# Patient Record
Sex: Male | Born: 1955 | Race: White | Hispanic: No | Smoking: Former smoker
Health system: Southern US, Community
[De-identification: ages and names within clinical notes are randomized; demographics above are authoritative.]

## PROBLEM LIST (undated history)

## (undated) DIAGNOSIS — N183 Chronic kidney disease, stage 3 unspecified: Secondary | ICD-10-CM

## (undated) DIAGNOSIS — J449 Chronic obstructive pulmonary disease, unspecified: Secondary | ICD-10-CM

## (undated) DIAGNOSIS — I700 Atherosclerosis of aorta: Secondary | ICD-10-CM

## (undated) DIAGNOSIS — K8020 Calculus of gallbladder without cholecystitis without obstruction: Secondary | ICD-10-CM

## (undated) DIAGNOSIS — N189 Chronic kidney disease, unspecified: Secondary | ICD-10-CM

## (undated) DIAGNOSIS — I10 Essential (primary) hypertension: Secondary | ICD-10-CM

## (undated) DIAGNOSIS — I2510 Atherosclerotic heart disease of native coronary artery without angina pectoris: Secondary | ICD-10-CM

## (undated) DIAGNOSIS — E785 Hyperlipidemia, unspecified: Secondary | ICD-10-CM

## (undated) DIAGNOSIS — M5416 Radiculopathy, lumbar region: Secondary | ICD-10-CM

## (undated) DIAGNOSIS — N1831 Chronic kidney disease, stage 3a: Secondary | ICD-10-CM

## (undated) DIAGNOSIS — G8929 Other chronic pain: Secondary | ICD-10-CM

## (undated) DIAGNOSIS — M1611 Unilateral primary osteoarthritis, right hip: Secondary | ICD-10-CM

## (undated) DIAGNOSIS — Z7901 Long term (current) use of anticoagulants: Secondary | ICD-10-CM

## (undated) DIAGNOSIS — R0609 Other forms of dyspnea: Secondary | ICD-10-CM

## (undated) DIAGNOSIS — Z803 Family history of malignant neoplasm of breast: Secondary | ICD-10-CM

## (undated) DIAGNOSIS — N200 Calculus of kidney: Secondary | ICD-10-CM

## (undated) HISTORY — DX: Family history of malignant neoplasm of breast: Z80.3

---

## 2001-11-20 HISTORY — PX: OTHER SURGICAL HISTORY: SHX169

## 2008-08-25 ENCOUNTER — Emergency Department: Payer: Self-pay | Admitting: Emergency Medicine

## 2009-01-09 ENCOUNTER — Emergency Department (HOSPITAL_COMMUNITY): Admission: EM | Admit: 2009-01-09 | Discharge: 2009-01-09 | Payer: Self-pay | Admitting: Emergency Medicine

## 2009-04-07 ENCOUNTER — Emergency Department (HOSPITAL_COMMUNITY): Admission: EM | Admit: 2009-04-07 | Discharge: 2009-04-08 | Payer: Self-pay | Admitting: Emergency Medicine

## 2009-12-21 ENCOUNTER — Emergency Department (HOSPITAL_COMMUNITY): Admission: EM | Admit: 2009-12-21 | Discharge: 2009-12-22 | Payer: Self-pay | Admitting: Emergency Medicine

## 2011-05-23 ENCOUNTER — Emergency Department (HOSPITAL_COMMUNITY): Payer: BC Managed Care – PPO

## 2011-05-23 ENCOUNTER — Emergency Department (HOSPITAL_COMMUNITY)
Admission: EM | Admit: 2011-05-23 | Discharge: 2011-05-23 | Disposition: A | Discharge status: Home / Self Care | Payer: BC Managed Care – PPO | Attending: Emergency Medicine | Admitting: Emergency Medicine

## 2011-05-23 DIAGNOSIS — N201 Calculus of ureter: Secondary | ICD-10-CM | POA: Insufficient documentation

## 2011-05-23 DIAGNOSIS — R509 Fever, unspecified: Secondary | ICD-10-CM | POA: Insufficient documentation

## 2011-05-23 DIAGNOSIS — R112 Nausea with vomiting, unspecified: Secondary | ICD-10-CM | POA: Insufficient documentation

## 2011-05-23 DIAGNOSIS — K8020 Calculus of gallbladder without cholecystitis without obstruction: Secondary | ICD-10-CM | POA: Insufficient documentation

## 2011-05-23 DIAGNOSIS — K7689 Other specified diseases of liver: Secondary | ICD-10-CM | POA: Insufficient documentation

## 2011-05-23 DIAGNOSIS — N200 Calculus of kidney: Secondary | ICD-10-CM | POA: Insufficient documentation

## 2011-05-23 DIAGNOSIS — J8410 Pulmonary fibrosis, unspecified: Secondary | ICD-10-CM | POA: Insufficient documentation

## 2011-05-23 DIAGNOSIS — R1031 Right lower quadrant pain: Secondary | ICD-10-CM | POA: Insufficient documentation

## 2011-05-23 DIAGNOSIS — N1330 Unspecified hydronephrosis: Secondary | ICD-10-CM | POA: Insufficient documentation

## 2011-05-23 DIAGNOSIS — R3129 Other microscopic hematuria: Secondary | ICD-10-CM | POA: Insufficient documentation

## 2011-05-23 LAB — COMPREHENSIVE METABOLIC PANEL
ALT: 40 U/L (ref 0–53)
AST: 20 U/L (ref 0–37)
Alkaline Phosphatase: 60 U/L (ref 39–117)
Calcium: 9.5 mg/dL (ref 8.4–10.5)
Chloride: 103 mEq/L (ref 96–112)
Creatinine, Ser: 1.28 mg/dL (ref 0.50–1.35)
GFR calc Af Amer: >60 mL/min (ref >60)
GFR calc non Af Amer: 59 mL/min — ABNORMAL LOW (ref >60)
Glucose, Bld: 108 mg/dL — ABNORMAL HIGH (ref 70–99)

## 2011-05-23 LAB — URINALYSIS, ROUTINE W REFLEX MICROSCOPIC
Bilirubin Urine: NEGATIVE
Leukocytes, UA: NEGATIVE
Nitrite: NEGATIVE
Protein, ur: NEGATIVE mg/dL
Urobilinogen, UA: 1 mg/dL (ref 0.0–1.0)

## 2011-05-23 LAB — DIFFERENTIAL
Basophils Absolute: 0 10*3/uL (ref 0.0–0.1)
Monocytes Absolute: 1.1 10*3/uL — ABNORMAL HIGH (ref 0.1–1.0)
Neutro Abs: 7.8 10*3/uL — ABNORMAL HIGH (ref 1.7–7.7)

## 2011-05-23 LAB — CBC
HCT: 47.9 % (ref 39.0–52.0)
Hemoglobin: 17.1 g/dL — ABNORMAL HIGH (ref 13.0–17.0)
MCH: 30.4 pg (ref 26.0–34.0)
MCV: 85.1 fL (ref 78.0–100.0)
WBC: 10.8 10*3/uL — ABNORMAL HIGH (ref 4.0–10.5)

## 2011-05-23 LAB — URINE MICROSCOPIC-ADD ON

## 2011-05-23 MED ORDER — IOHEXOL 300 MG/ML  SOLN: 75.0000 mL | Freq: Once | INTRAMUSCULAR | Status: DC | PRN

## 2011-06-08 ENCOUNTER — Encounter (INDEPENDENT_AMBULATORY_CARE_PROVIDER_SITE_OTHER): Payer: Self-pay | Admitting: General Surgery

## 2011-06-13 ENCOUNTER — Ambulatory Visit (INDEPENDENT_AMBULATORY_CARE_PROVIDER_SITE_OTHER): Payer: BC Managed Care – PPO | Admitting: General Surgery

## 2011-06-20 ENCOUNTER — Encounter (INDEPENDENT_AMBULATORY_CARE_PROVIDER_SITE_OTHER): Payer: Self-pay | Admitting: General Surgery

## 2011-06-20 ENCOUNTER — Ambulatory Visit (INDEPENDENT_AMBULATORY_CARE_PROVIDER_SITE_OTHER): Payer: BC Managed Care – PPO | Admitting: General Surgery

## 2011-06-20 VITALS — BP 124/92 | HR 64 | Temp 97.2°F | Ht 73.0 in | Wt 344.6 lb

## 2011-06-20 DIAGNOSIS — K8020 Calculus of gallbladder without cholecystitis without obstruction: Secondary | ICD-10-CM

## 2011-06-20 NOTE — Patient Instructions (Signed)
We will schedule you to have an elective laparoscopic cholecystectomy in the near future. Adhere to a low-fat diet prior to surgery. Please read the information booklet that I gave you.

## 2011-06-20 NOTE — Progress Notes (Signed)
Subjective:     Patient ID: Aaron Clarke, male   DOB: 1956/09/15, 55 y.o.   MRN: [id number]  HPI This is a 55 year old Caucasian man, referred to me by Jetta Lout at [facility] urology for evaluation of gallstones.  This patient's primary care physician is Dr. Melven Sartorius at [facility] urgent care.  The patient developed an episode of right-sided and right upper quadrant abdominal pain and went to the emergency department on May 23, 2011. He saw Dr. Mancel Bale. He wasn't having any nausea vomiting fever chills or diarrhea. He has a past history of kidney stones. A CT scan was done which showed a small left renal stone but no hydronephrosis, a 4 mm distal right ureteral stone and moderate right hydronephrosis and hydroureter, and multiple gallstones filling the gallbladder. Fatty infiltration of the liver was also noted.  The patient states he passed a kidney stone and feels better. He states that he saw Jetta Lout at [facility] and that he had further x-rays which showed a kidney stone was gone. He has been doing pretty well since that time.  He denies a past history of gallbladder attacks. Denies any history of liver disease, heart disease,or diarrhea. His liver function tests and CBC were normal when he was in the ER on July 3. He feels times a day.  Significant medical comorbidities are morbid obesity, asthma, former smoker, and history of reflux.  Past Medical History  Diagnosis Date  . Asthma   . Family history of breast cancer     mother    Current Outpatient Prescriptions  Medication Sig Dispense Refill  . Oxycodone-Acetaminophen (PERCOCET PO) Take by mouth.        . Tamsulosin HCl (FLOMAX) 0.4 MG CAPS Take by mouth.         No Known Allergies  Family History  Problem Relation Age of Onset  . Cancer Mother     breast  . Cancer Father     colon  . Heart disease Father   . Asthma Sister     History  Substance Use Topics  . Smoking status: Former Smoker      Quit date: 06/19/1988  . Smokeless tobacco: Not on file  . Alcohol Use: Yes     2 -3 per day      Review of Systems  Constitutional: Negative.   HENT: Negative.   Eyes: Negative.   Respiratory: Negative.   Cardiovascular: Negative.   Gastrointestinal: Positive for abdominal pain. Negative for nausea, vomiting, diarrhea, constipation, blood in stool, abdominal distention, anal bleeding and rectal pain.  Musculoskeletal: Negative.   Neurological: Negative.   Hematological: Negative.   Psychiatric/Behavioral: Negative.        Objective:   Physical Exam  Constitutional: He is oriented to person, place, and time. He appears well-developed and well-nourished.       Morbid obesity  HENT:  Head: Normocephalic and atraumatic.  Mouth/Throat: No oropharyngeal exudate.  Eyes: Conjunctivae are normal. Pupils are equal, round, and reactive to light. No scleral icterus.  Neck: Neck supple. No JVD present. No tracheal deviation present. No thyromegaly present.  Cardiovascular: Normal rate and regular rhythm.   No murmur heard. Pulmonary/Chest: Breath sounds normal. No respiratory distress. He has no wheezes. He has no rales. He exhibits no tenderness.  Abdominal: Soft. Bowel sounds are normal. He exhibits no distension and no mass. There is no tenderness. There is no rebound and no guarding.  Musculoskeletal: He exhibits no edema and no tenderness.  Lymphadenopathy:    He has no cervical adenopathy.  Neurological: He is alert and oriented to person, place, and time. He exhibits normal muscle tone.  Skin: Skin is warm and dry. No rash noted. No erythema. No pallor.  Psychiatric: He has a normal mood and affect. His behavior is normal. Judgment and thought content normal.       Assessment:     Chronic cholecystitis with cholelithiasis. I suspect he has been minimally symptomatic from this, but is at significant risk in the future.  Nephrolithiasis, recent episode of a partial or  suction of the right ureter, now resolved and  Morbid obesity, significant.  History of asthma.  Former smoker.  History of heartburn.    Plan:     I told him that cholecystectomy was elective, but would probably be necessary at some point in the future. We talked about the natural history of his gallstones, the pros and cons of surgery versus observation. At the end of the discussion with he and his wife wanted to go ahead and have the cholecystectomy  to avoid problems in the future. I discussed this in detail with him and he was given patient information booklet.  We will schedule him for laparoscopic cholecystectomy with cholangiogram.  I discussed the indications and details of surgery with the patient and his wife. Risks and complications have been outlined, including but not limited to bleeding, infection, conversion to open laparotomy, injury to adjacent organs such as the main bile duct or liver were or intestine was measured at discharge of surgery, bile leak, wound problems, cardiac pulmonary and thromboembolic problems. He seems to understand all of these issues well. At this time all of his questions are answered. He is in full agreement with this plan.

## 2011-06-30 ENCOUNTER — Other Ambulatory Visit (HOSPITAL_COMMUNITY): Payer: BC Managed Care – PPO

## 2011-07-07 ENCOUNTER — Ambulatory Visit (HOSPITAL_COMMUNITY): Admission: RE | Admit: 2011-07-07 | Payer: BC Managed Care – PPO | Source: Ambulatory Visit | Admitting: General Surgery

## 2011-11-30 ENCOUNTER — Encounter (HOSPITAL_COMMUNITY): Payer: Self-pay | Admitting: *Deleted

## 2011-11-30 ENCOUNTER — Other Ambulatory Visit: Payer: Self-pay

## 2011-11-30 ENCOUNTER — Emergency Department (HOSPITAL_COMMUNITY): Payer: BC Managed Care – PPO

## 2011-11-30 ENCOUNTER — Emergency Department (HOSPITAL_COMMUNITY)
Admission: EM | Admit: 2011-11-30 | Discharge: 2011-12-01 | Disposition: A | Discharge status: Home / Self Care | Payer: BC Managed Care – PPO | Attending: Emergency Medicine | Admitting: Emergency Medicine

## 2011-11-30 DIAGNOSIS — K8020 Calculus of gallbladder without cholecystitis without obstruction: Secondary | ICD-10-CM | POA: Insufficient documentation

## 2011-11-30 DIAGNOSIS — R1011 Right upper quadrant pain: Secondary | ICD-10-CM | POA: Insufficient documentation

## 2011-11-30 DIAGNOSIS — K8050 Calculus of bile duct without cholangitis or cholecystitis without obstruction: Secondary | ICD-10-CM

## 2011-11-30 DIAGNOSIS — J45909 Unspecified asthma, uncomplicated: Secondary | ICD-10-CM | POA: Insufficient documentation

## 2011-11-30 DIAGNOSIS — R110 Nausea: Secondary | ICD-10-CM | POA: Insufficient documentation

## 2011-11-30 HISTORY — DX: Calculus of gallbladder without cholecystitis without obstruction: K80.20

## 2011-11-30 LAB — DIFFERENTIAL
Basophils Absolute: 0 10*3/uL (ref 0.0–0.1)
Lymphocytes Relative: 31 % (ref 12–46)
Lymphs Abs: 2.4 10*3/uL (ref 0.7–4.0)
Neutro Abs: 4.1 10*3/uL (ref 1.7–7.7)

## 2011-11-30 LAB — CBC
MCH: 29.8 pg (ref 26.0–34.0)
MCHC: 35.3 g/dL (ref 30.0–36.0)
Platelets: 175 10*3/uL (ref 150–400)
RBC: 5.43 MIL/uL (ref 4.22–5.81)
RDW: 13.8 % (ref 11.5–15.5)

## 2011-11-30 LAB — COMPREHENSIVE METABOLIC PANEL
AST: 20 U/L (ref 0–37)
Albumin: 3.6 g/dL (ref 3.5–5.2)
Alkaline Phosphatase: 56 U/L (ref 39–117)
Creatinine, Ser: 1.13 mg/dL (ref 0.50–1.35)
GFR calc Af Amer: 84 mL/min — ABNORMAL LOW (ref >90)
Glucose, Bld: 89 mg/dL (ref 70–99)
Potassium: 3.4 mEq/L — ABNORMAL LOW (ref 3.5–5.1)
Total Bilirubin: 0.5 mg/dL (ref 0.3–1.2)

## 2011-11-30 LAB — LIPASE, BLOOD: Lipase: 32 U/L (ref 11–59)

## 2011-11-30 MED ORDER — ONDANSETRON HCL 4 MG/2ML IJ SOLN
4.0000 mg | Freq: Once | INTRAMUSCULAR | Status: AC
  Administered 2011-11-30: 4 mg via INTRAVENOUS
  Filled 2011-11-30: qty 2

## 2011-11-30 MED ORDER — HYDROMORPHONE HCL PF 1 MG/ML IJ SOLN
1.0000 mg | Freq: Once | INTRAMUSCULAR | Status: AC
  Administered 2011-11-30: 1 mg via INTRAVENOUS
  Filled 2011-11-30: qty 1

## 2011-11-30 NOTE — ED Notes (Signed)
The pt is c/o rt upper abd pain for 2-3 weeks worse with coughing.  No nv or diarrhea.  He has known gallstones for 6 months and needs surgery but has not been able to have the surgery yet

## 2011-11-30 NOTE — ED Provider Notes (Signed)
56 year old male has had known gallstones for 6 months but states she was unable to afford to have the surgery done to take the gallbladder out. He has continued to have pain in the right upper quadrant intermittently. It is worse if he is laying in certain positions and worse if he eats greasy foods. On exam, he has significant right upper quadrant tenderness. There is no obvious scleral icterus. 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 I have explained to him that cholecystectomy is his only reasonable treatment option and this is a question of when it will happen. Laboratory and ultrasound workup have been ordered.  Dione Booze, MD 11/30/11 2158

## 2011-11-30 NOTE — ED Provider Notes (Signed)
History     CSN: [id number]  Arrival date & time 11/30/11  4098   First MD Initiated Contact with Patient 11/30/11 2058      Chief Complaint  Patient presents with  . Abdominal Pain    (Consider location/radiation/quality/duration/timing/severity/associated sxs/prior treatment) HPI history obtained from patient's bowels Patient is a 56 year old male with known history of cholelithiasis who presents with her quadrant abdominal pain. He has intermittently had pain here for the last several months to year. For the last week or so he has had significant worsening of this pain. Over the last 2 days it is even worse. It is intermittent but becoming more persistent. It is located in his right upper quadrant and occasionally radiates towards his right side. No radiation through to his back. He notes his pain worse after heavy foods. No vomiting or diarrhea but does have mild nausea. No fever or jaundice recently. Patient has been evaluated by a general surgeon for this problem and was supposed to have a cholecystectomy about 6 months ago (but not done d/t financial limitations). Overall severity moderate.  Past Medical History  Diagnosis Date  . Asthma   . Family history of breast cancer     mother  . Cholelithiases     History reviewed. No pertinent past surgical history.  Family History  Problem Relation Age of Onset  . Cancer Mother     breast  . Cancer Father     colon  . Heart disease Father   . Asthma Sister     History  Substance Use Topics  . Smoking status: Former Smoker    Quit date: 06/19/1988  . Smokeless tobacco: Not on file  . Alcohol Use: Yes     2 -3 per day      Review of Systems  Constitutional: Negative for fever, chills and activity change.  HENT: Negative for congestion and neck pain.   Respiratory: Negative for cough, chest tightness, shortness of breath and wheezing.   Cardiovascular: Negative for chest pain.  Gastrointestinal: Positive for nausea  and abdominal pain. Negative for vomiting, diarrhea and abdominal distention.  Genitourinary: Negative for difficulty urinating.  Musculoskeletal: Negative for gait problem.  Skin: Negative for rash.  Neurological: Negative for weakness and numbness.  Psychiatric/Behavioral: Negative for behavioral problems and confusion.  All other systems reviewed and are negative.    Allergies  Review of patient's allergies indicates no known allergies.  Home Medications   Current Outpatient Rx  Name Route Sig Dispense Refill  . OXYCODONE-ACETAMINOPHEN 5-325 MG PO TABS Oral Take 1-2 tablets by mouth every 6 (six) hours as needed for pain. 30 tablet 0  . PROMETHAZINE HCL 25 MG PO TABS Oral Take 1 tablet (25 mg total) by mouth every 6 (six) hours as needed for nausea. 30 tablet 0    BP 113/69  Pulse 80  Temp(Src) 98.3 F (36.8 C) (Oral)  Resp 18  Ht 7\' 3"  (2.21 m)  Wt 321 lb (145.605 kg)  BMI 29.82 kg/m2  SpO2 88%  Physical Exam  Nursing note and vitals reviewed. Constitutional: He is oriented to person, place, and time. He appears well-developed and well-nourished. No distress.  HENT:  Head: Normocephalic.  Nose: Nose normal.  Eyes: EOM are normal. Pupils are equal, round, and reactive to light. No scleral icterus.  Neck: Normal range of motion. Neck supple.  Cardiovascular: Normal rate, regular rhythm and intact distal pulses.   Pulmonary/Chest: Effort normal and breath sounds normal. No respiratory distress. He  exhibits no tenderness.  Abdominal: Soft. He exhibits no distension. There is tenderness (moderate tenderness to palpation right upper quadrant. Negative Murphy's sign. No guarding or rebound. No peritonitis.).  Musculoskeletal: Normal range of motion. He exhibits no edema and no tenderness.  Neurological: He is alert and oriented to person, place, and time.       Normal strength  Skin: Skin is warm and dry. He is not diaphoretic.       No jaundice  Psychiatric: He has a  normal mood and affect. His behavior is normal. Thought content normal.    ED Course  Procedures (including critical care time)  ECG from 11/30/2011 and 22 O2: Heart rate 77. Normal sinus rhythm. Normal axis. Right bundle branch block. Normal T waves and ST segments. No acute ischemia. QT normal. No prior EKG available for comparison.  Labs Reviewed  COMPREHENSIVE METABOLIC PANEL - Abnormal; Notable for the following:    Potassium 3.4 (*)    GFR calc non Af Amer 73 (*)    GFR calc Af Amer 84 (*)    All other components within normal limits  CBC  DIFFERENTIAL  LIPASE, BLOOD   US Abdomen Complete  11/30/2011  *RADIOLOGY REPORT*  Clinical Data:  Abdominal pain.  Right upper quadrant pain.  COMPLETE ABDOMINAL ULTRASOUND  Comparison:  05/23/2011.  Findings:  Gallbladder:  The gallbladder is packed with gallstones. Gallbladder wall measures normal at 2.5 mm.  Sonographic Murphy's sign is present. No pericholecystic fluid.  Common bile duct:  Suboptimal visualization due to overlying bowel gas.  Visualized portion measures 4 mm.  Based on lack of visualization, if cholecystectomy is performed, intraoperative cholangiogram is recommended.  Liver:  Heterogeneous echotexture with diffuse increased echogenicity, likely representing geographic fatty infiltration. There may be some focal sparing adjacent to the gallbladder fossa.  IVC:  Appears normal.  Pancreas:  Not visualized due to overlying bowel gas.  Spleen:  10.4 cm. 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  Right Kidney:  10.2 cm.  5 mm intraparenchymal hyperechoic region may represent hemorrhagic cyst but is nonspecific.  This does not demonstrate posterior acoustic shadowing.  Left Kidney:  14 cm.  13 mm echogenic focus in the left inferior renal pole without posterior acoustic shadowing.  Again, this is nonspecific.  Abdominal aorta:  Nonvisualization due to overlying bowel gas.  IMPRESSION: 1.   Gallbladder packed with gallstones with positive sonographic Murphy's sign, highly suggestive of acute cholecystitis. Inadequate visualization of the common bile duct.  The visualized portion is nondilated.  If cholecystectomy is performed, intraoperative cholangiogram is recommended. 2.  Nonspecific bilateral renal echogenic lesions.  These may represent hemorrhagic cysts but are incompletely characterized on ultrasound.  Follow-up renal MRI recommended. Non-emergent MRI should be deferred until patient has been discharged for the acute illness, and can optimally cooperate with positioning and breath- holding instructions. 3.  Suboptimal evaluation of the abdomen due to overlying bowel gas.  4.  Probable geographic fatty infiltration of the liver.  This can be followed up on abdominal MRI.  Original Report Authenticated By: Andreas Newport, M.D.     1. Biliary colic   2. Cholelithiasis       MDM   Patient with known cholelithiasis here with significant worsening of right upper quadrant abdominal pain. He has not had any recent fever, jaundice, or difficulty tolerating by mouth. His exam reveals abdominal tenderness to palpation without peritonitis. He is afebrile. Suspect biliary colic but we'll  obtain labs and ultrasound rule out cholecystitis. Already has general surgeon to followup with if no cholecystitis.  Lab work is unremarkable. Ultrasound shows large stone burden and gallbladder with normal gallbladder wall thickness. There is mention of positive sonographic Murphy's sign and possibility of cholecystitis because of this. I discussed case with on-call general surgery [facility] surgery).  Patient is well-appearing with mild to moderate abdominal TTP. After one round of medications, his symptoms are resolved. He is afebrile without bladder abnormalities. Based on his good clinical appearance and lab workup strongly doubt cholecystitis. Based on my description of patient and ultrasound  findings, general surgery recommends close outpatient followup.  I discussed plan with patient and he understands return precautions.   Milus Glazier 12/01/11 [phone]

## 2011-11-30 NOTE — ED Notes (Signed)
[phone] paged Dr. Magnus Ivan

## 2011-12-01 MED ORDER — PROMETHAZINE HCL 25 MG PO TABS: 25.0000 mg | ORAL_TABLET | Freq: Four times a day (QID) | ORAL | Status: DC | PRN

## 2011-12-01 MED ORDER — OXYCODONE-ACETAMINOPHEN 5-325 MG PO TABS: 1.0000 | ORAL_TABLET | Freq: Four times a day (QID) | ORAL | Status: AC | PRN

## 2011-12-01 NOTE — Discharge Instructions (Signed)
Should call surgery clinic first thing in the morning to schedule close follow up appointment. Use medications as directed as needed for pain or nausea. He had bland diet. Return if you have new fever over 101, inability to keep fluids down, significant worsening of pain, or any other concerns.  Cholelithiasis Cholelithiasis (also called gallstones) is a form of gallbladder disease where gallstones form in your gallbladder. The gallbladder is a non-essential organ that stores bile made in the liver, which helps digest fats. Gallstones begin as small crystals and slowly grow into stones. Gallstone pain occurs when the gallbladder spasms, and a gallstone is blocking the duct. Pain can also occur when a stone passes out of the duct.  Women are more likely to develop gallstones than men. Other factors that increase the risk of gallbladder disease are:  Having multiple pregnancies. Physicians sometimes advise removing diseased gallbladders before future pregnancies.   Obesity.   Diets heavy in fried foods and fat.   Increasing age (older than 44).   Prolonged use of medications containing male hormones.   Diabetes mellitus.   Rapid weight loss.   Family history of gallstones (heredity).  SYMPTOMS  Feeling sick to your stomach (nauseous).   Abdominal pain.   Yellowing of the skin (jaundice).   Sudden pain. It may persist from several minutes to several hours.   Worsening pain with deep breathing or when jarred.   Fever.   Tenderness to the touch.  In some cases, when gallstones do not move into the bile duct, people have no pain or symptoms. These are called "silent" gallstones. TREATMENT In severe cases, emergency surgery may be required. HOME CARE INSTRUCTIONS   Only take over-the-counter or prescription medicines for pain, discomfort, or fever as directed by your caregiver.   Follow a low-fat diet until seen again. Fat causes the gallbladder to contract, which can result in  pain.   Follow up as instructed. Attacks are almost always recurrent and surgery is usually required for permanent treatment.  SEEK IMMEDIATE MEDICAL CARE IF:   Your pain increases and is not controlled by medications.   You have an oral temperature above 102 F (38.9 C), not controlled by medication.   You develop nausea and vomiting.  MAKE SURE YOU:   Understand these instructions.   Will watch your condition.   Will get help right away if you are not doing well or get worse.  Document Released: 11/02/2005 Document Revised: 07/19/2011 Document Reviewed: 01/05/2011 [facility] Patient Information 2012 [place], [place].Biliary Colic  Biliary colic is a steady or irregular pain in the upper abdomen. It is usually under the right side of the rib cage. It happens when gallstones interfere with the normal flow of bile from the gallbladder. Bile is a liquid that helps to digest fats. Bile is made in the liver and stored in the gallbladder. When you eat a meal, bile passes from the gallbladder through the cystic duct and the common bile duct into the small intestine. There, it mixes with partially digested food. If a gallstone blocks either of these ducts, the normal flow of bile is blocked. The muscle cells in the bile duct contract forcefully to try to move the stone. This causes the pain of biliary colic.  SYMPTOMS   A person with biliary colic usually complains of pain in the upper abdomen. This pain can be:   In the center of the upper abdomen just below the breastbone.   In the upper-right part of the abdomen,  near the gallbladder and liver.   Spread back toward the right shoulder blade.   Nausea and vomiting.   The pain usually occurs after eating.   Biliary colic is usually triggered by the digestive system's demand for bile. The demand for bile is high after fatty meals. Symptoms can also occur when a person who has been fasting suddenly eats a very large meal. Most episodes of  biliary colic pass after 1 to 5 hours. After the most intense pain passes, your abdomen may continue to ache mildly for about 24 hours.  DIAGNOSIS  After you describe your symptoms, your caregiver will perform a physical exam. He or she will pay attention to the upper right portion of your belly (abdomen). This is the area of your liver and gallbladder. An ultrasound will help your caregiver look for gallstones. Specialized scans of the gallbladder may also be done. Blood tests may be done, especially if you have fever or if your pain persists. PREVENTION  Biliary colic can be prevented by controlling the risk factors for gallstones. Some of these risk factors, such as heredity, increasing age, and pregnancy are a normal part of life. Obesity and a high-fat diet are risk factors you can change through a healthy lifestyle. Women going through menopause who take hormone replacement therapy (estrogen) are also more likely to develop biliary colic. TREATMENT   Pain medication may be prescribed.   You may be encouraged to eat a fat-free diet.   If the first episode of biliary colic is severe, or episodes of colic keep retuning, surgery to remove the gallbladder (cholecystectomy) is usually recommended. This procedure can be done through small incisions using an instrument called a laparoscope. The procedure often requires a brief stay in the hospital. Some people can leave the hospital the same day. It is the most widely used treatment in people troubled by painful gallstones. It is effective and safe, with no complications in more than 90% of cases.   If surgery cannot be done, medication that dissolves gallstones may be used. This medication is expensive and can take months or years to work. Only small stones will dissolve.   Rarely, medication to dissolve gallstones is combined with a procedure called shock-wave lithotripsy. This procedure uses carefully aimed shock waves to break up gallstones. In many  people treated with this procedure, gallstones form again within a few years.  PROGNOSIS  If gallstones block your cystic duct or common bile duct, you are at risk for repeated episodes of biliary colic. There is also a 25% chance that you will develop a gallbladder infection(acute cholecystitis), or some other complication of gallstones within 10 to 20 years. If you have surgery, schedule it at a time that is convenient for you and at a time when you are not sick. HOME CARE INSTRUCTIONS   Drink plenty of clear fluids.   Avoid fatty, greasy or fried foods, or any foods that make your pain worse.   Take medications as directed.  SEEK MEDICAL CARE IF:   You develop a fever over 100.5 F (38.1 C).   Your pain gets worse over time.   You develop nausea that prevents you from eating and drinking.   You develop vomiting.  SEEK IMMEDIATE MEDICAL CARE IF:   You have continuous or severe belly (abdominal) pain which is not relieved with medications.   You develop nausea and vomiting which is not relieved with medications.   You have symptoms of biliary colic and you suddenly  develop a fever and shaking chills. This may signal cholecystitis. Call your caregiver immediately.   You develop a yellow color to your skin or the white part of your eyes (jaundice).  Document Released: 04/09/2006 Document Revised: 07/19/2011 Document Reviewed: 06/18/2008 [facility] Patient Information 2012 [place], [place].

## 2011-12-03 NOTE — ED Provider Notes (Signed)
Medical screening examination/treatment/procedure(s) were conducted as a shared visit with non-physician practitioner(s) and myself.  I personally evaluated the patient during the encounter   Garo Heidelberg, MD 12/03/11 0814 

## 2011-12-14 ENCOUNTER — Encounter (HOSPITAL_COMMUNITY): Payer: Self-pay | Admitting: Pharmacy Technician

## 2011-12-14 ENCOUNTER — Ambulatory Visit (INDEPENDENT_AMBULATORY_CARE_PROVIDER_SITE_OTHER): Payer: BC Managed Care – PPO | Admitting: General Surgery

## 2011-12-14 ENCOUNTER — Encounter (INDEPENDENT_AMBULATORY_CARE_PROVIDER_SITE_OTHER): Payer: Self-pay | Admitting: General Surgery

## 2011-12-14 VITALS — BP 118/92 | HR 89 | Temp 98.3°F | Ht 73.0 in | Wt 318.0 lb

## 2011-12-14 DIAGNOSIS — E669 Obesity, unspecified: Secondary | ICD-10-CM

## 2011-12-14 DIAGNOSIS — J45909 Unspecified asthma, uncomplicated: Secondary | ICD-10-CM | POA: Insufficient documentation

## 2011-12-14 DIAGNOSIS — K8020 Calculus of gallbladder without cholecystitis without obstruction: Secondary | ICD-10-CM

## 2011-12-14 DIAGNOSIS — E66813 Obesity, class 3: Secondary | ICD-10-CM | POA: Insufficient documentation

## 2011-12-14 DIAGNOSIS — Z6841 Body Mass Index (BMI) 40.0 and over, adult: Secondary | ICD-10-CM | POA: Insufficient documentation

## 2011-12-14 NOTE — Progress Notes (Signed)
Patient ID: Aaron Clarke, male   DOB: [birth year], 56 y.o.   MRN: [id number]  Chief Complaint  Patient presents with  . Follow-up    re-eval gallbladder    HPI Aaron Clarke is a 56 y.o. male.  He is referred back to me with accelerating biliary colic and gallstones bty Dr. Preston Fleeting (EDP).  I saw this morbidly obese gentleman on June 20, 2011, upon referral from [facility]. He had been found to have gallstones and kidney stones. He was not as symptomatic from his gallbladder but he wanted to go ahead and have surgery. We did schedule him for cholecystectomy but he canceled that because of insurance reasons.  He states that he has been having worsening right upper quadrant pain for 3 weeks. He's been able to eat he does not vomit he does not have any fever or chills but is having daily right upper quadrant pain he says it hurts to move.  He was seen in the [facility]  emergency room on January 10. CBC, C-met panel, and lipase were normal. Ultrasound showed gallbladder packed with gallstones and positive Murphy sign. Probable fatty infiltration of the liver. Nonspecific renal echogenic lesions. He was discharged home by the EDP and referred to our office electively. They gave him a prescription for narcotic analgesics. HPI  Past Medical History  Diagnosis Date  . Asthma   . Family history of breast cancer     mother  . Cholelithiases     History reviewed. No pertinent past surgical history.  Family History  Problem Relation Age of Onset  . Cancer Mother     breast  . Cancer Father     colon  . Heart disease Father   . Asthma Sister     Social History History  Substance Use Topics  . Smoking status: Former Smoker    Quit date: 06/19/1988  . Smokeless tobacco: Not on file  . Alcohol Use: Yes     2 -3 per day    No Known Allergies  Current Outpatient Prescriptions  Medication Sig Dispense Refill  . oxyCODONE-acetaminophen (PERCOCET) 5-325 MG per tablet       .  promethazine (PHENERGAN) 25 MG tablet       . Tamsulosin HCl (FLOMAX) 0.4 MG CAPS Take 0.4 mg by mouth daily.        Review of Systems Review of Systems  Constitutional: Negative for fever, chills and unexpected weight change.  HENT: Negative for hearing loss, congestion, sore throat, trouble swallowing and voice change.   Eyes: Negative for visual disturbance.  Respiratory: Negative for cough and wheezing.   Cardiovascular: Negative for chest pain, palpitations and leg swelling.  Gastrointestinal: Positive for abdominal pain and constipation. Negative for nausea, vomiting, diarrhea, blood in stool, abdominal distention, anal bleeding and rectal pain.  Genitourinary: Negative for hematuria and difficulty urinating.  Musculoskeletal: Negative for arthralgias.  Skin: Negative for rash and wound.  Neurological: Negative for seizures, syncope, weakness and headaches.  Hematological: Negative for adenopathy. Does not bruise/bleed easily.  Psychiatric/Behavioral: Negative for confusion.    Blood pressure 118/92, pulse 89, temperature 98.3 F (36.8 C), temperature source Temporal, height 6\' 1"  (1.854 m), weight 318 lb (144.244 kg), SpO2 95.00%.  Physical Exam Physical Exam  Constitutional: He is oriented to person, place, and time. He appears well-developed and well-nourished. No distress.       Morbidly obese  HENT:  Head: Normocephalic.  Nose: Nose normal.  Mouth/Throat: No oropharyngeal exudate.  Eyes: Conjunctivae and  EOM are normal. Pupils are equal, round, and reactive to light. Right eye exhibits no discharge. Left eye exhibits no discharge. No scleral icterus.  Neck: Normal range of motion. Neck supple. No JVD present. No tracheal deviation present. No thyromegaly present.  Cardiovascular: Normal rate, regular rhythm, normal heart sounds and intact distal pulses.   No murmur heard. Pulmonary/Chest: Effort normal and breath sounds normal. No stridor. No respiratory distress. He has  no wheezes. He has no rales. He exhibits no tenderness.  Abdominal: Soft. Bowel sounds are normal. He exhibits no distension and no mass. There is tenderness. There is no rebound and no guarding.       Tender to percuss Right costal margin, Abdomen otherwise soft without RUQ mass.  Not distended.  Musculoskeletal: Normal range of motion. He exhibits no edema and no tenderness.  Lymphadenopathy:    He has no cervical adenopathy.  Neurological: He is alert and oriented to person, place, and time. He has normal reflexes. Coordination normal.  Skin: Skin is warm and dry. No rash noted. He is not diaphoretic. No erythema. No pallor.  Psychiatric: He has a normal mood and affect. His behavior is normal. Judgment and thought content normal.    Data Reviewed I reviewed my old records, ER records, recent labs, recent ultrasound.  Assessment    Chronic cholecystitis with cholelithiasis, now with accelerating and almost daily biliary colic.  Morbid obesity  History kidney stones and a urologic workup.  Suspect fatty infiltration of liver  History asthma  Former smoker    Plan    Schedule for laparoscopic cholecystectomy with cholangiogram, possible open in the near future  Strict low-fat diet in the meantime.  Once again, I discussed the indications and details and risks of surgery with him. We went over this in great detail with him and his wife. They understand the issues. Their questions were answered. They agree with the plan.   Angelia Mould. Derrell Lolling, M.D., [facility] General and Minimally invasive Surgery Breast and Colorectal Surgery Office:   [phone] Pager:   [phone]        Ernestene Mention 12/14/2011, 10:34 AM

## 2011-12-14 NOTE — Patient Instructions (Signed)
I agree that your right upper quadrant pain is due to your gallstones. We will schedule you for a gallbladder operation as soon as possible. Please drink lots of fluid and deep and extremely low-fat diet in the meantime.  Laparoscopic Cholecystectomy Laparoscopic cholecystectomy is surgery to remove the gallbladder. The gallbladder is located slightly to the right of center in the abdomen, behind the liver. It is a concentrating and storage sac for the bile produced in the liver. Bile aids in the digestion and absorption of fats. Gallbladder disease (cholecystitis) is an inflammation of your gallbladder. This condition is usually caused by a buildup of gallstones (cholelithiasis) in your gallbladder. Gallstones can block the flow of bile, resulting in inflammation and pain. In severe cases, emergency surgery may be required. When emergency surgery is not required, you will have time to prepare for the procedure. Laparoscopic surgery is an alternative to open surgery. Laparoscopic surgery usually has a shorter recovery time. Your common bile duct may also need to be examined and explored. Your caregiver will discuss this with you if he or she feels this should be done. If stones are found in the common bile duct, they may be removed. LET YOUR CAREGIVER KNOW ABOUT:  Allergies to food or medicine.   Medicines taken, including vitamins, herbs, eyedrops, over-the-counter medicines, and creams.   Use of steroids (by mouth or creams).   Previous problems with anesthetics or numbing medicines.   History of bleeding problems or blood clots.   Previous surgery.   Other health problems, including diabetes and kidney problems.   Possibility of pregnancy, if this applies.  RISKS AND COMPLICATIONS All surgery is associated with risks. Some problems that may occur following this procedure include:  Infection.   Damage to the common bile duct, nerves, arteries, veins, or other internal organs such as the  stomach or intestines.   Bleeding.   A stone may remain in the common bile duct.  BEFORE THE PROCEDURE  Do not take aspirin for 3 days prior to surgery or blood thinners for 1 week prior to surgery.   Do not eat or drink anything after midnight the night before surgery.   Let your caregiver know if you develop a cold or other infectious problem prior to surgery.   You should be present 60 minutes before the procedure or as directed.  PROCEDURE  You will be given medicine that makes you sleep (general anesthetic). When you are asleep, your surgeon will make several small cuts (incisions) in your abdomen. One of these incisions is used to insert a small, lighted scope (laparoscope) into the abdomen. The laparoscope helps the surgeon see into your abdomen. Carbon dioxide gas will be pumped into your abdomen. The gas allows more room for the surgeon to perform your surgery. Other operating instruments are inserted through the other incisions. Laparoscopic procedures may not be appropriate when:  There is major scarring from previous surgery.   The gallbladder is extremely inflamed.   There are bleeding disorders or unexpected cirrhosis of the liver.   A pregnancy is near term.   Other conditions make the laparoscopic procedure impossible.  If your surgeon feels it is not safe to continue with a laparoscopic procedure, he or she will perform an open abdominal procedure. In this case, the surgeon will make an incision to open the abdomen. This gives the surgeon a larger view and field to work within. This may allow the surgeon to perform procedures that sometimes cannot be performed with  a laparoscope alone. Open surgery has a longer recovery time. AFTER THE PROCEDURE  You will be taken to the recovery area where a nurse will watch and check your progress.   You may be allowed to go home the same day.   Do not resume physical activities until directed by your caregiver.   You may resume  a normal diet and activities as directed.  Document Released: 11/06/2005 Document Revised: 07/19/2011 Document Reviewed: 04/21/2011 [facility] Patient Information 2012 [place], [place].

## 2011-12-18 ENCOUNTER — Encounter (HOSPITAL_COMMUNITY)
Admission: RE | Admit: 2011-12-18 | Discharge: 2011-12-18 | Disposition: A | Discharge status: Home / Self Care | Payer: BC Managed Care – PPO | Source: Ambulatory Visit | Attending: General Surgery | Admitting: General Surgery

## 2011-12-18 ENCOUNTER — Encounter (HOSPITAL_COMMUNITY): Payer: Self-pay

## 2011-12-18 ENCOUNTER — Ambulatory Visit (HOSPITAL_COMMUNITY)
Admission: RE | Admit: 2011-12-18 | Discharge: 2011-12-18 | Disposition: A | Discharge status: Home / Self Care | Payer: BC Managed Care – PPO | Source: Ambulatory Visit | Attending: Anesthesiology | Admitting: Anesthesiology

## 2011-12-18 DIAGNOSIS — R05 Cough: Secondary | ICD-10-CM | POA: Insufficient documentation

## 2011-12-18 DIAGNOSIS — R059 Cough, unspecified: Secondary | ICD-10-CM | POA: Insufficient documentation

## 2011-12-18 DIAGNOSIS — Z01812 Encounter for preprocedural laboratory examination: Secondary | ICD-10-CM | POA: Insufficient documentation

## 2011-12-18 DIAGNOSIS — Z01818 Encounter for other preprocedural examination: Secondary | ICD-10-CM | POA: Insufficient documentation

## 2011-12-18 HISTORY — DX: Chronic kidney disease, unspecified: N18.9

## 2011-12-18 LAB — CBC
HCT: 46.9 % (ref 39.0–52.0)
Hemoglobin: 16.4 g/dL (ref 13.0–17.0)
MCH: 29.7 pg (ref 26.0–34.0)
MCHC: 35 g/dL (ref 30.0–36.0)
RDW: 13.8 % (ref 11.5–15.5)

## 2011-12-18 LAB — DIFFERENTIAL
Eosinophils Absolute: 0.3 10*3/uL (ref 0.0–0.7)
Lymphocytes Relative: 23 % (ref 12–46)
Lymphs Abs: 2 10*3/uL (ref 0.7–4.0)
Monocytes Relative: 8 % (ref 3–12)
Neutro Abs: 5.8 10*3/uL (ref 1.7–7.7)
Neutrophils Relative %: 66 % (ref 43–77)

## 2011-12-18 LAB — COMPREHENSIVE METABOLIC PANEL
Albumin: 3.7 g/dL (ref 3.5–5.2)
BUN: 13 mg/dL (ref 6–23)
Calcium: 9.8 mg/dL (ref 8.4–10.5)
Creatinine, Ser: 1.03 mg/dL (ref 0.50–1.35)
GFR calc Af Amer: >90 mL/min (ref >90)
Glucose, Bld: 103 mg/dL — ABNORMAL HIGH (ref 70–99)
Total Protein: 7.8 g/dL (ref 6.0–8.3)

## 2011-12-18 LAB — URINE MICROSCOPIC-ADD ON

## 2011-12-18 LAB — URINALYSIS, ROUTINE W REFLEX MICROSCOPIC
Ketones, ur: NEGATIVE mg/dL
Leukocytes, UA: NEGATIVE
Nitrite: NEGATIVE
Specific Gravity, Urine: 1.019 (ref 1.005–1.030)
pH: 5 (ref 5.0–8.0)

## 2011-12-18 LAB — SURGICAL PCR SCREEN: MRSA, PCR: NEGATIVE

## 2011-12-18 LAB — LIPASE, BLOOD: Lipase: 30 U/L (ref 11–59)

## 2011-12-18 MED ORDER — CEFAZOLIN SODIUM-DEXTROSE 2-3 GM-% IV SOLR
2.0000 g | INTRAVENOUS | Status: AC
  Administered 2011-12-19: 2 g via INTRAVENOUS
  Filled 2011-12-18: qty 50

## 2011-12-18 NOTE — Pre-Procedure Instructions (Signed)
20 Drevon Plog  12/18/2011   Your procedure is scheduled on:  Tuesday, January 29th.  Report to [facility] at 8:30 AM.  Call this number if you have problems the morning of surgery: [phone]   Remember:   Do not eat food:After Midnight.   May have clear liquids: up to 4 Hours before arrival. 4:30am.   Clear liquids include soda, tea, black coffee, apple or grape juice, broth.  Take these medicines the morning of surgery with A SIP OF WATER: Oxycodone- Acetaminophen, Promethazine (Phergan).  Use Albuterol Inhaler prior to going to hospital and bring Inhaler with you.   Do not wear jewelry, make-up or nail polish.  Do not wear lotions, powders, or perfumes. You may wear deodorant.  Do not shave 48 hours prior to surgery.  Do not bring valuables to the hospital.  Contacts, dentures or bridgework may not be worn into surgery.  Leave suitcase in the car. After surgery it may be brought to your room.  For patients admitted to the hospital, checkout time is 11:00 AM the day of discharge.   Patients discharged the day of surgery will not be allowed to drive home.  Name and phone number of your driver: --  Special Instructions: CHG Shower Use Special Wash: 1/2 bottle night before surgery and 1/2 bottle morning of surgery.   Please read over the following fact sheets that you were given: Pain Booklet, Coughing and Deep Breathing, MRSA Information and Surgical Site Infection Prevention

## 2011-12-18 NOTE — H&P (Signed)
Aaron Clarke    MRN: [id number]   Description: 56 year old male  Provider: Ernestene Mention, MD  Department: [facility]     Diagnoses     Gallstones   - Primary    574.20    Obesity     278.00    Asthma     493.90      Reason for Visit     Follow-up    re-eval gallbladder                          History and Physical    Ernestene Mention, MD  Patient ID: Aaron Clarke, male   DOB: 1956/04/25, 56 y.o.   MRN: [id number]    Chief Complaint   Patient presents with   .  Follow-up       re-eval gallbladder      HPI Aaron Clarke is a 56 y.o. male.  He is referred back to me with accelerating biliary colic and gallstones bty Dr. Preston Fleeting (EDP).  I saw this morbidly obese gentleman on June 20, 2011, upon referral from [facility]. He had been found to have gallstones and kidney stones. He was not as symptomatic from his gallbladder but he wanted to go ahead and have surgery. We did schedule him for cholecystectomy but he canceled that because of insurance reasons.  He states that he has been having worsening right upper quadrant pain for 3 weeks. He's been able to eat he does not vomit he does not have any fever or chills but is having daily right upper quadrant pain he says it hurts to move.  He was seen in the [facility]  emergency room on January 10. CBC, C-met panel, and lipase were normal. Ultrasound showed gallbladder packed with gallstones and positive Murphy sign. Probable fatty infiltration of the liver. Nonspecific renal echogenic lesions. He was discharged home by the EDP and referred to our office electively. They gave him a prescription for narcotic analgesics.     Past Medical History   Diagnosis  Date   .  Asthma     .  Family history of breast cancer         mother   .  Cholelithiases        History reviewed. No pertinent past surgical history.    Family History   Problem  Relation  Age of Onset   .  Cancer  Mother         breast    .  Cancer  Father         colon   .  Heart disease  Father     .  Asthma  Sister       Social History History   Substance Use Topics   .  Smoking status:  Former Smoker       Quit date:  06/19/1988   .  Smokeless tobacco:  Not on file   .  Alcohol Use:  Yes         2 -3 per day     No Known Allergies    Current Outpatient Prescriptions   Medication  Sig  Dispense  Refill   .  oxyCODONE-acetaminophen (PERCOCET) 5-325 MG per tablet           .  promethazine (PHENERGAN) 25 MG tablet           .  Tamsulosin HCl (FLOMAX) 0.4 MG CAPS  Take 0.4  mg by mouth daily.            Review of Systems   Constitutional: Negative for fever, chills and unexpected weight change.  HENT: Negative for hearing loss, congestion, sore throat, trouble swallowing and voice change.   Eyes: Negative for visual disturbance.  Respiratory: Negative for cough and wheezing.   Cardiovascular: Negative for chest pain, palpitations and leg swelling.  Gastrointestinal: Positive for abdominal pain and constipation. Negative for nausea, vomiting, diarrhea, blood in stool, abdominal distention, anal bleeding and rectal pain.  Genitourinary: Negative for hematuria and difficulty urinating.  Musculoskeletal: Negative for arthralgias.  Skin: Negative for rash and wound.  Neurological: Negative for seizures, syncope, weakness and headaches.  Hematological: Negative for adenopathy. Does not bruise/bleed easily.  Psychiatric/Behavioral: Negative for confusion.    Blood pressure 118/92, pulse 89, temperature 98.3 F (36.8 C), temperature source Temporal, height 6\' 1"  (1.854 m), weight 318 lb (144.244 kg), SpO2 95.00%.   Physical Exam   Constitutional: He is oriented to person, place, and time. He appears well-developed and well-nourished. No distress.       Morbidly obese  HENT:   Head: Normocephalic.   Nose: Nose normal.   Mouth/Throat: No oropharyngeal exudate.  Eyes: Conjunctivae and EOM are normal. Pupils  are equal, round, and reactive to light. Right eye exhibits no discharge. Left eye exhibits no discharge. No scleral icterus.  Neck: Normal range of motion. Neck supple. No JVD present. No tracheal deviation present. No thyromegaly present.  Cardiovascular: Normal rate, regular rhythm, normal heart sounds and intact distal pulses.    No murmur heard. Pulmonary/Chest: Effort normal and breath sounds normal. No stridor. No respiratory distress. He has no wheezes. He has no rales. He exhibits no tenderness.  Abdominal: Soft. Bowel sounds are normal. He exhibits no distension and no mass. There is tenderness. There is no rebound and no guarding.       Tender to percuss Right costal margin, Abdomen otherwise soft without RUQ mass.  Not distended.  Musculoskeletal: Normal range of motion. He exhibits no edema and no tenderness.  Lymphadenopathy:    He has no cervical adenopathy.  Neurological: He is alert and oriented to person, place, and time. He has normal reflexes. Coordination normal.  Skin: Skin is warm and dry. No rash noted. He is not diaphoretic. No erythema. No pallor.  Psychiatric: He has a normal mood and affect. His behavior is normal. Judgment and thought content normal.    Data Reviewed I reviewed my old records, ER records, recent labs, recent ultrasound.   Assessment Chronic cholecystitis with cholelithiasis, now with accelerating and almost daily biliary colic.  Morbid obesity  History kidney stones and a urologic workup.  Suspect fatty infiltration of liver  History asthma  Former smoker   Plan Schedule for laparoscopic cholecystectomy with cholangiogram, possible open in the near future  Strict low-fat diet in the meantime.  Once again, I discussed the indications and details and risks of surgery with him. We went over this in great detail with him and his wife. They understand the issues. Their questions were answered. They agree with the plan.      Angelia Mould. Derrell Lolling, M.D., [facility] General and Minimally invasive Surgery Breast and Colorectal Surgery Office:   [phone] Pager:   [phone] 12/18/2011 4:08 PM

## 2011-12-19 ENCOUNTER — Ambulatory Visit (HOSPITAL_COMMUNITY): Payer: BC Managed Care – PPO | Admitting: Anesthesiology

## 2011-12-19 ENCOUNTER — Encounter (HOSPITAL_COMMUNITY): Payer: Self-pay | Admitting: Anesthesiology

## 2011-12-19 ENCOUNTER — Ambulatory Visit (HOSPITAL_COMMUNITY): Payer: BC Managed Care – PPO

## 2011-12-19 ENCOUNTER — Ambulatory Visit (HOSPITAL_COMMUNITY)
Admission: RE | Admit: 2011-12-19 | Discharge: 2011-12-20 | Disposition: A | Discharge status: Home / Self Care | Payer: BC Managed Care – PPO | Source: Ambulatory Visit | Attending: General Surgery | Admitting: General Surgery

## 2011-12-19 ENCOUNTER — Encounter (HOSPITAL_COMMUNITY)
Admission: RE | Disposition: A | Discharge status: Home / Self Care | Payer: Self-pay | Source: Ambulatory Visit | Attending: General Surgery

## 2011-12-19 ENCOUNTER — Other Ambulatory Visit (INDEPENDENT_AMBULATORY_CARE_PROVIDER_SITE_OTHER): Payer: Self-pay | Admitting: General Surgery

## 2011-12-19 DIAGNOSIS — J45909 Unspecified asthma, uncomplicated: Secondary | ICD-10-CM | POA: Insufficient documentation

## 2011-12-19 DIAGNOSIS — Z6841 Body Mass Index (BMI) 40.0 and over, adult: Secondary | ICD-10-CM | POA: Insufficient documentation

## 2011-12-19 DIAGNOSIS — K8010 Calculus of gallbladder with chronic cholecystitis without obstruction: Principal | ICD-10-CM | POA: Diagnosis present

## 2011-12-19 HISTORY — PX: CHOLECYSTECTOMY: SHX55

## 2011-12-19 LAB — CBC
Hemoglobin: 15.2 g/dL (ref 13.0–17.0)
MCH: 29 pg (ref 26.0–34.0)
MCHC: 33.6 g/dL (ref 30.0–36.0)
RDW: 14.1 % (ref 11.5–15.5)

## 2011-12-19 LAB — CREATININE, SERUM
Creatinine, Ser: 1.16 mg/dL (ref 0.50–1.35)
GFR calc non Af Amer: 70 mL/min — ABNORMAL LOW (ref >90)

## 2011-12-19 SURGERY — LAPAROSCOPIC CHOLECYSTECTOMY WITH INTRAOPERATIVE CHOLANGIOGRAM
Anesthesia: General | Site: Abdomen | Wound class: Clean Contaminated

## 2011-12-19 MED ORDER — IOHEXOL 300 MG/ML  SOLN
INTRAMUSCULAR | Status: DC | PRN
  Administered 2011-12-19: 16 mL

## 2011-12-19 MED ORDER — HEPARIN SODIUM (PORCINE) 5000 UNIT/ML IJ SOLN
5000.0000 [IU] | Freq: Three times a day (TID) | INTRAMUSCULAR | Status: DC
  Filled 2011-12-19 (×2): qty 1

## 2011-12-19 MED ORDER — PROMETHAZINE HCL 25 MG/ML IJ SOLN: 6.2500 mg | INTRAMUSCULAR | Status: DC | PRN

## 2011-12-19 MED ORDER — ONDANSETRON HCL 4 MG/2ML IJ SOLN
INTRAMUSCULAR | Status: DC | PRN
  Administered 2011-12-19: 4 mg via INTRAVENOUS

## 2011-12-19 MED ORDER — SUCCINYLCHOLINE CHLORIDE 20 MG/ML IJ SOLN
INTRAMUSCULAR | Status: DC | PRN
  Administered 2011-12-19: 120 mg via INTRAVENOUS

## 2011-12-19 MED ORDER — MEPERIDINE HCL 25 MG/ML IJ SOLN: 6.2500 mg | INTRAMUSCULAR | Status: DC | PRN

## 2011-12-19 MED ORDER — ROCURONIUM BROMIDE 100 MG/10ML IV SOLN
INTRAVENOUS | Status: DC | PRN
  Administered 2011-12-19: 50 mg via INTRAVENOUS

## 2011-12-19 MED ORDER — CHLORHEXIDINE GLUCONATE CLOTH 2 % EX PADS: 6.0000 | MEDICATED_PAD | Freq: Every day | CUTANEOUS | Status: DC

## 2011-12-19 MED ORDER — ONDANSETRON HCL 4 MG PO TABS: 4.0000 mg | ORAL_TABLET | Freq: Four times a day (QID) | ORAL | Status: DC | PRN

## 2011-12-19 MED ORDER — NEOSTIGMINE METHYLSULFATE 1 MG/ML IJ SOLN
INTRAMUSCULAR | Status: DC | PRN
  Administered 2011-12-19: 5 mg via INTRAVENOUS

## 2011-12-19 MED ORDER — MUPIROCIN 2 % EX OINT
1.0000 "application " | TOPICAL_OINTMENT | Freq: Two times a day (BID) | CUTANEOUS | Status: DC
  Administered 2011-12-19: 09:00:00 via NASAL
  Administered 2011-12-19: 1 via NASAL
  Filled 2011-12-19: qty 22

## 2011-12-19 MED ORDER — OXYCODONE-ACETAMINOPHEN 5-325 MG PO TABS
2.0000 | ORAL_TABLET | ORAL | Status: DC | PRN
  Administered 2011-12-20: 2 via ORAL
  Filled 2011-12-19: qty 2

## 2011-12-19 MED ORDER — CEFAZOLIN SODIUM-DEXTROSE 2-3 GM-% IV SOLR
2.0000 g | Freq: Three times a day (TID) | INTRAVENOUS | Status: AC
  Administered 2011-12-19 – 2011-12-20 (×3): 2 g via INTRAVENOUS
  Filled 2011-12-19 (×3): qty 50

## 2011-12-19 MED ORDER — LACTATED RINGERS IV SOLN
INTRAVENOUS | Status: DC
  Administered 2011-12-19: 10:00:00 via INTRAVENOUS

## 2011-12-19 MED ORDER — HYDROMORPHONE HCL PF 1 MG/ML IJ SOLN
0.2500 mg | INTRAMUSCULAR | Status: DC | PRN
  Administered 2011-12-19 (×2): 0.5 mg via INTRAVENOUS

## 2011-12-19 MED ORDER — MORPHINE SULFATE 4 MG/ML IJ SOLN: 0.0500 mg/kg | INTRAMUSCULAR | Status: DC | PRN

## 2011-12-19 MED ORDER — LACTATED RINGERS IV SOLN
INTRAVENOUS | Status: DC | PRN
  Administered 2011-12-19 (×2): via INTRAVENOUS

## 2011-12-19 MED ORDER — MORPHINE SULFATE 2 MG/ML IJ SOLN: 0.0500 mg/kg | INTRAMUSCULAR | Status: DC | PRN

## 2011-12-19 MED ORDER — MORPHINE SULFATE 2 MG/ML IJ SOLN
2.0000 mg | INTRAMUSCULAR | Status: DC | PRN
  Administered 2011-12-19 – 2011-12-20 (×3): 2 mg via INTRAVENOUS
  Filled 2011-12-19 (×3): qty 1

## 2011-12-19 MED ORDER — MUPIROCIN 2 % EX OINT
TOPICAL_OINTMENT | CUTANEOUS | Status: AC
  Filled 2011-12-19: qty 22

## 2011-12-19 MED ORDER — MIDAZOLAM HCL 5 MG/5ML IJ SOLN
INTRAMUSCULAR | Status: DC | PRN
  Administered 2011-12-19: 2 mg via INTRAVENOUS

## 2011-12-19 MED ORDER — LIDOCAINE HCL (CARDIAC) 20 MG/ML IV SOLN
INTRAVENOUS | Status: DC | PRN
  Administered 2011-12-19: 50 mg via INTRAVENOUS

## 2011-12-19 MED ORDER — POTASSIUM CHLORIDE IN NACL 20-0.9 MEQ/L-% IV SOLN
INTRAVENOUS | Status: DC
  Administered 2011-12-19 – 2011-12-20 (×2): via INTRAVENOUS
  Filled 2011-12-19 (×5): qty 1000

## 2011-12-19 MED ORDER — SODIUM CHLORIDE 0.9 % IR SOLN
Status: DC | PRN
  Administered 2011-12-19: 3000 mL

## 2011-12-19 MED ORDER — FENTANYL CITRATE 0.05 MG/ML IJ SOLN
INTRAMUSCULAR | Status: DC | PRN
  Administered 2011-12-19: 25 ug via INTRAVENOUS
  Administered 2011-12-19: 50 ug via INTRAVENOUS
  Administered 2011-12-19: 100 ug via INTRAVENOUS
  Administered 2011-12-19: 50 ug via INTRAVENOUS
  Administered 2011-12-19: 100 ug via INTRAVENOUS
  Administered 2011-12-19: 25 ug via INTRAVENOUS
  Administered 2011-12-19: 50 ug via INTRAVENOUS

## 2011-12-19 MED ORDER — MUPIROCIN 2 % EX OINT
1.0000 "application " | TOPICAL_OINTMENT | Freq: Two times a day (BID) | CUTANEOUS | Status: DC
  Filled 2011-12-19: qty 22

## 2011-12-19 MED ORDER — BUPIVACAINE-EPINEPHRINE 0.25% -1:200000 IJ SOLN
INTRAMUSCULAR | Status: DC | PRN
  Administered 2011-12-19: 19 mL

## 2011-12-19 MED ORDER — PROMETHAZINE HCL 25 MG PO TABS: 25.0000 mg | ORAL_TABLET | Freq: Every day | ORAL | Status: DC | PRN

## 2011-12-19 MED ORDER — HYDROCODONE-ACETAMINOPHEN 5-325 MG PO TABS: 1.0000 | ORAL_TABLET | ORAL | Status: DC | PRN

## 2011-12-19 MED ORDER — ALBUTEROL SULFATE HFA 108 (90 BASE) MCG/ACT IN AERS
2.0000 | INHALATION_SPRAY | Freq: Four times a day (QID) | RESPIRATORY_TRACT | Status: DC | PRN
  Filled 2011-12-19: qty 6.7

## 2011-12-19 MED ORDER — CHLORHEXIDINE GLUCONATE CLOTH 2 % EX PADS
6.0000 | MEDICATED_PAD | Freq: Every day | CUTANEOUS | Status: DC
  Administered 2011-12-20: 6 via TOPICAL

## 2011-12-19 MED ORDER — GLYCOPYRROLATE 0.2 MG/ML IJ SOLN
INTRAMUSCULAR | Status: DC | PRN
  Administered 2011-12-19: .6 mg via INTRAVENOUS

## 2011-12-19 MED ORDER — ONDANSETRON HCL 4 MG/2ML IJ SOLN: 4.0000 mg | Freq: Four times a day (QID) | INTRAMUSCULAR | Status: DC | PRN

## 2011-12-19 MED ORDER — LABETALOL HCL 5 MG/ML IV SOLN
INTRAVENOUS | Status: DC | PRN
  Administered 2011-12-19: 2.5 mg via INTRAVENOUS
  Administered 2011-12-19 (×2): 5 mg via INTRAVENOUS
  Administered 2011-12-19: 2.5 mg via INTRAVENOUS

## 2011-12-19 MED ORDER — PROPOFOL 10 MG/ML IV EMUL
INTRAVENOUS | Status: DC | PRN
  Administered 2011-12-19: 50 mg via INTRAVENOUS
  Administered 2011-12-19: 200 mg via INTRAVENOUS

## 2011-12-19 MED ORDER — HEMOSTATIC AGENTS (NO CHARGE) OPTIME
TOPICAL | Status: DC | PRN
  Administered 2011-12-19: 1 via TOPICAL

## 2011-12-19 SURGICAL SUPPLY — 41 items
APPLIER CLIP ROT 10 11.4 M/L (STAPLE) ×2
BENZOIN TINCTURE PRP APPL 2/3 (GAUZE/BANDAGES/DRESSINGS) ×2 IMPLANT
BLADE SURG ROTATE 9660 (MISCELLANEOUS) IMPLANT
CANISTER SUCTION 2500CC (MISCELLANEOUS) ×2 IMPLANT
CHLORAPREP W/TINT 26ML (MISCELLANEOUS) ×2 IMPLANT
CLIP APPLIE ROT 10 11.4 M/L (STAPLE) ×1 IMPLANT
CLOTH BEACON ORANGE TIMEOUT ST (SAFETY) ×2 IMPLANT
COVER MAYO STAND STRL (DRAPES) ×2 IMPLANT
COVER SURGICAL LIGHT HANDLE (MISCELLANEOUS) ×2 IMPLANT
DECANTER SPIKE VIAL GLASS SM (MISCELLANEOUS) ×4 IMPLANT
DERMABOND ADVANCED (GAUZE/BANDAGES/DRESSINGS) ×1
DERMABOND ADVANCED .7 DNX12 (GAUZE/BANDAGES/DRESSINGS) ×1 IMPLANT
DRAPE C-ARM 42X72 X-RAY (DRAPES) ×2 IMPLANT
DRSG TEGADERM 2-3/8X2-3/4 SM (GAUZE/BANDAGES/DRESSINGS) ×6 IMPLANT
DRSG TEGADERM 4X4.75 (GAUZE/BANDAGES/DRESSINGS) ×2 IMPLANT
ELECT REM PT RETURN 9FT ADLT (ELECTROSURGICAL) ×2
ELECTRODE REM PT RTRN 9FT ADLT (ELECTROSURGICAL) ×1 IMPLANT
GAUZE SPONGE 2X2 8PLY STRL LF (GAUZE/BANDAGES/DRESSINGS) ×1 IMPLANT
GLOVE EUDERMIC 7 POWDERFREE (GLOVE) ×2 IMPLANT
GOWN PREVENTION PLUS XLARGE (GOWN DISPOSABLE) ×2 IMPLANT
GOWN STRL NON-REIN LRG LVL3 (GOWN DISPOSABLE) ×6 IMPLANT
HEMOSTAT SNOW SURGICEL 2X4 (HEMOSTASIS) ×2 IMPLANT
KIT BASIN OR (CUSTOM PROCEDURE TRAY) ×2 IMPLANT
KIT ROOM TURNOVER OR (KITS) ×2 IMPLANT
NS IRRIG 1000ML POUR BTL (IV SOLUTION) ×2 IMPLANT
PAD ARMBOARD 7.5X6 YLW CONV (MISCELLANEOUS) ×2 IMPLANT
POUCH SPECIMEN RETRIEVAL 10MM (ENDOMECHANICALS) ×2 IMPLANT
SCISSORS LAP 5X35 DISP (ENDOMECHANICALS) IMPLANT
SET CHOLANGIOGRAPH 5 50 .035 (SET/KITS/TRAYS/PACK) ×2 IMPLANT
SET IRRIG TUBING LAPAROSCOPIC (IRRIGATION / IRRIGATOR) ×2 IMPLANT
SLEEVE ENDOPATH XCEL 5M (ENDOMECHANICALS) ×2 IMPLANT
SPECIMEN JAR SMALL (MISCELLANEOUS) ×2 IMPLANT
SPONGE GAUZE 2X2 STER 10/PKG (GAUZE/BANDAGES/DRESSINGS) ×1
STRIP CLOSURE SKIN 1/2X4 (GAUZE/BANDAGES/DRESSINGS) ×2 IMPLANT
SUT MNCRL AB 4-0 PS2 18 (SUTURE) ×4 IMPLANT
TOWEL OR 17X24 6PK STRL BLUE (TOWEL DISPOSABLE) ×2 IMPLANT
TOWEL OR 17X26 10 PK STRL BLUE (TOWEL DISPOSABLE) ×2 IMPLANT
TRAY LAPAROSCOPIC (CUSTOM PROCEDURE TRAY) ×2 IMPLANT
TROCAR XCEL BLUNT TIP 100MML (ENDOMECHANICALS) ×2 IMPLANT
TROCAR XCEL NON-BLD 11X100MML (ENDOMECHANICALS) ×2 IMPLANT
TROCAR XCEL NON-BLD 5MMX100MML (ENDOMECHANICALS) ×2 IMPLANT

## 2011-12-19 NOTE — Anesthesia Procedure Notes (Signed)
Procedure Name: Intubation Date/Time: 12/19/2011 10:33 AM Performed by: Romie Minus Pre-anesthesia Checklist: Patient identified, Emergency Drugs available, Suction available and Patient being monitored Patient Re-evaluated:Patient Re-evaluated prior to inductionOxygen Delivery Method: Circle System Utilized Preoxygenation: Pre-oxygenation with 100% oxygen Intubation Type: IV induction Ventilation: Mask ventilation without difficulty and Oral airway inserted - appropriate to patient size Laryngoscope Size: Miller and 2 Grade View: Grade I Tube type: Oral Tube size: 7.5 mm Number of attempts: 1 Placement Confirmation: ETT inserted through vocal cords under direct vision,  positive ETCO2 and breath sounds checked- equal and bilateral Secured at: 22 cm Tube secured with: Tape Dental Injury: Teeth and Oropharynx as per pre-operative assessment

## 2011-12-19 NOTE — Transfer of Care (Signed)
Immediate Anesthesia Transfer of Care Note  Patient: Aaron Clarke  Procedure(s) Performed:  LAPAROSCOPIC CHOLECYSTECTOMY WITH INTRAOPERATIVE CHOLANGIOGRAM  Patient Location: PACU  Anesthesia Type: General  Level of Consciousness: awake  Airway & Oxygen Therapy: Patient Spontanous Breathing and Patient connected to face mask oxygen  Post-op Assessment: Report given to PACU RN and Post -op Vital signs reviewed and stable  Post vital signs: stable  Complications: No apparent anesthesia complications

## 2011-12-19 NOTE — Anesthesia Preprocedure Evaluation (Addendum)
Anesthesia Evaluation  Patient identified by MRN, date of birth, ID band Patient awake    Reviewed: Allergy & Precautions, H&P , NPO status , Patient's Chart, lab work & pertinent test results  Airway  TM Distance: >3 FB Neck ROM: Full    Dental  (+) Poor Dentition   Pulmonary asthma , former smoker clear to auscultation        Cardiovascular neg cardio ROS Regular Normal    Neuro/Psych    GI/Hepatic negative GI ROS, Neg liver ROS,   Endo/Other  Negative Endocrine ROSMorbid obesity  Renal/GU negative Renal ROS     Musculoskeletal   Abdominal   Peds  Hematology negative hematology ROS (+)   Anesthesia Other Findings   Reproductive/Obstetrics                          Anesthesia Physical Anesthesia Plan  ASA: III  Anesthesia Plan: General   Post-op Pain Management:    Induction: Intravenous  Airway Management Planned: Oral ETT  Additional Equipment:   Intra-op Plan:   Post-operative Plan: Extubation in OR  Informed Consent: I have reviewed the patients History and Physical, chart, labs and discussed the procedure including the risks, benefits and alternatives for the proposed anesthesia with the patient or authorized representative who has indicated his/her understanding and acceptance.     Plan Discussed with: CRNA and Anesthesiologist  Anesthesia Plan Comments:        Anesthesia Quick Evaluation

## 2011-12-19 NOTE — Preoperative (Signed)
Beta Blockers   Reason not to administer Beta Blockers:Not Applicable 

## 2011-12-19 NOTE — Anesthesia Postprocedure Evaluation (Signed)
  Anesthesia Post-op Note  Patient: Aaron Clarke  Procedure(s) Performed:  LAPAROSCOPIC CHOLECYSTECTOMY WITH INTRAOPERATIVE CHOLANGIOGRAM  Patient Location: PACU  Anesthesia Type: General  Level of Consciousness: awake  Airway and Oxygen Therapy: Patient Spontanous Breathing  Post-op Pain: mild  Post-op Assessment: Post-op Vital signs reviewed  Post-op Vital Signs: stable  Complications: No apparent anesthesia complications

## 2011-12-19 NOTE — Op Note (Addendum)
Patient Name:           Aaron Clarke   Date of Surgery:        12/19/2011  Pre op Diagnosis:      Chronic cholecystitis with cholelithiasis  Post op Diagnosis:    same  Procedure:                 aparoscopic cholecystectomy with cholangiogram  Surgeon:                     Angelia Mould. Derrell Lolling, M.D., FACS  Assistant:                      Karie Soda, M.D., [facility]  Operative Indications:   This is a 56 year old Caucasian male with morbid obesity, BMI of 42, and asthma. He was found to have gallstones 6 months ago but was not symptomatic. More recently has  been having worsening right upper quadrant pain and states that he has been having pain almost every day for the past 3 weeks. No fever or chills. Recent evaluation in the emergency department showed normal laboratory tests and repeat ultrasound showed gallbladder packed with gallstones and a positive Murphy sign and fatty infiltration of liver. He was referred to me and I evaluated him in the office last week. We expedited scheduling of his surgery he is brought to the operating room for cholecystectomy.  Operative Findings:       The gallbladder was chronically inflamed, somewhat fibrotic, slightly thick walled, clearly discolored and was packed with gallstones. The cystic duct was small. Cholangiogram was normal, showing normal intrahepatic and extrahepatic bile ducts, no filling defect, and no obstruction with good flow of contrast into the duodenum. The liver was large. The stomach and duodenum ,small intestine, large intestine were grossly normal to inspection.  Procedure in Detail:          Following the induction of general endotracheal anesthesia the patient's abdomen was prepped and draped in a sterile fashion. Surgical time out was performed. Intravenous antibiotics were given. 0.5% Marcaine with epinephrine was used as local infiltration anesthetic. A vertically oriented incision was made at the superior rim of the umbilicus. The  fascia was incised in the midline the abdominal cavity entered under direct vision. An 11 mm Hassan trocar was inserted and secured with the purstring suture of 0 Vicryl. Pneumoperitoneum was created and the camera was inserted. An 11 mm trocar was placed in the subxiphoid region Two 5 mm trocars placed in the right upper quadrant. We identified the gallbladder and elevated the fundus. We took adhesions down. We isolated the infundibulum. A large stone was impacted in the infundibulum.. We dissected out the cystic duct and cystic artery. A cholangiogram catheter was inserted into the cystic duct and a cholangiogram was obtained using the C-arm. The cholangiogram showed a long cystic duct, normal intrahepatic and extrahepatic biliary anatomy, no dilatation, no filling defect, and no obstruction. The cholangiocatheter was removed, the cystic duct was secured with metal clips and divided. We then isolated the cystic artery as it went into the gallbladder, secured it with metal clips and divided it. The gallbladder was slowly dissected from its bed with electrocautery and a little bit of blunt dissection. This was very tedious dissection. We also got the gallbladder in a specimen bag and removed it  through the umbilical port. The operative field was copiously irrigated. Hemostasis was excellent and achieved with electrocautery. The  Gallbladder bed was  a little bit raw and so I placed a piece of Snowhemostatic sponge and there was no bleeding. We evacuated all the irrigation fluid. The pneumoperitoneum was released. The trochrs were removed. The fascia at the umbilicus was closed with 0 Vicryl suture. Skin incisions were irrigated and the skin was closed with subcuticular sutures of 4-0 Monocryl and Steri-Strips. Dry bandages were placed.  All counts correct.  . Complications none. EBL 25 cc. To PACU stable.  Angelia Mould. Derrell Lolling, M.D., FACS General and Minimally Invasive Surgery Breast and Colorectal  Surgery  12/19/2011 12:08 PM

## 2011-12-19 NOTE — Interval H&P Note (Signed)
History and Physical Interval Note:  12/19/2011 10:16 AM  Aaron Clarke  has presented today for surgery, with the diagnosis of gallstones   The goals of treatment and the various methods of treatment have been discussed with the patient and family. After consideration of risks, benefits and other options for treatment, the patient has consented to  Procedure(s): LAPAROSCOPIC CHOLECYSTECTOMY WITH INTRAOPERATIVE CHOLANGIOGRAM as a surgical intervention .  The patients' history has been reviewed today , patient examined, no change in status, stable for surgery.  I have reviewed the patients' chart and labs.  Questions were answered to the patient's satisfaction.     Ernestene Mention 12/19/2011 10:17 AM

## 2011-12-20 ENCOUNTER — Encounter (HOSPITAL_COMMUNITY): Payer: Self-pay | Admitting: General Surgery

## 2011-12-20 MED ORDER — HYDROCODONE-ACETAMINOPHEN 5-325 MG PO TABS: 1.0000 | ORAL_TABLET | ORAL | Status: AC | PRN

## 2011-12-20 NOTE — Progress Notes (Signed)
12/20/11 11:20, iv dc'd, dc instructions gone over with patient. Diet,  Activity, weight restriction, and incisional care gone over. Follow up appointment to be made. Signs and symptoms of infection and worsening condition gone over with patient.  Prescription for pain medicine given. Patient told to continue treatment for positive staph for 3 more days. Home meds reviewed. Patient verbalized understanding of all. Copy of instructions given to patient.

## 2011-12-20 NOTE — Progress Notes (Signed)
1 Day Post-Op  Subjective: Doing well postop. Tolerating diet. Minimal pain. Able to void. No nausea.  Cholangiogram reviewed and is normal.  Objective: Vital signs in last 24 hours: Temp:  [97.5 F (36.4 C)-99.1 F (37.3 C)] 98.7 F (37.1 C) (01/30 0141) Pulse Rate:  [57-96] 85  (01/30 0141) Resp:  [13-22] 20  (01/30 0141) BP: (110-142)/(63-92) 140/63 mmHg (01/30 0141) SpO2:  [92 %-98 %] 95 % (01/30 0141) Last BM Date: 12/18/11  Intake/Output from previous day: 01/29 0701 - 01/30 0700 In: 3633.3 [I.V.:3533.3; IV Piggyback:100] Out: 3475 [Urine:3475] Intake/Output this shift: Total I/O In: 1633.3 [I.V.:1533.3; IV Piggyback:100] Out: 2350 [Urine:2350]  General appearance: alert Resp: clear to auscultation bilaterally GI: obese. Soft. Wounds looked fine. Benign postop exam.  Lab Results:  Results for orders placed during the hospital encounter of 12/19/11 (from the past 24 hour(s))  CBC     Status: Normal   Collection Time   12/19/11  5:10 PM      Component Value Range   WBC 9.4  4.0 - 10.5 (K/uL)   RBC 5.25  4.22 - 5.81 (MIL/uL)   Hemoglobin 15.2  13.0 - 17.0 (g/dL)   HCT 14.7  82.9 - 56.2 (%)   MCV 86.1  78.0 - 100.0 (fL)   MCH 29.0  26.0 - 34.0 (pg)   MCHC 33.6  30.0 - 36.0 (g/dL)   RDW 13.0  86.5 - 78.4 (%)   Platelets 165  150 - 400 (K/uL)  CREATININE, SERUM     Status: Abnormal   Collection Time   12/19/11  5:10 PM      Component Value Range   Creatinine, Ser 1.16  0.50 - 1.35 (mg/dL)   GFR calc non Af Amer 70 (*) >90 (mL/min)   GFR calc Af Amer 81 (*) >90 (mL/min)     Studies/Results: @RISRSLT24 @     .  ceFAZolin (ANCEF) IV  2 g Intravenous 60 min Pre-Op  .  ceFAZolin (ANCEF) IV  2 g Intravenous Q8H  . Chlorhexidine Gluconate Cloth  6 each Topical Daily  . heparin  5,000 Units Subcutaneous Q8H  . mupirocin ointment  1 application Nasal BID  . mupirocin ointment      . DISCONTD: Chlorhexidine Gluconate Cloth  6 each Topical Daily  . DISCONTD:  mupirocin ointment  1 application Nasal BID     Assessment/Plan: s/p Procedure(s): LAPAROSCOPIC CHOLECYSTECTOMY WITH INTRAOPERATIVE CHOLANGIOGRAM  Stable and doing well postoperatively and ready for discharge.  Instructions written. Prescription for Vicodin given Return to see me in 3 weeks.  LOS: 1 day    Ousman Dise M 12/20/2011  . .prob

## 2011-12-22 ENCOUNTER — Telehealth (INDEPENDENT_AMBULATORY_CARE_PROVIDER_SITE_OTHER): Payer: Self-pay | Admitting: General Surgery

## 2011-12-25 ENCOUNTER — Telehealth (INDEPENDENT_AMBULATORY_CARE_PROVIDER_SITE_OTHER): Payer: Self-pay

## 2011-12-25 NOTE — Telephone Encounter (Signed)
Pt home doing well. Po appt made. 

## 2012-01-16 ENCOUNTER — Encounter (INDEPENDENT_AMBULATORY_CARE_PROVIDER_SITE_OTHER): Payer: Self-pay | Admitting: General Surgery

## 2012-01-16 ENCOUNTER — Ambulatory Visit (INDEPENDENT_AMBULATORY_CARE_PROVIDER_SITE_OTHER): Payer: BC Managed Care – PPO | Admitting: General Surgery

## 2012-01-16 VITALS — BP 130/90 | HR 80 | Temp 98.0°F | Resp 18 | Ht 71.0 in | Wt 326.0 lb

## 2012-01-16 DIAGNOSIS — Z9889 Other specified postprocedural states: Secondary | ICD-10-CM

## 2012-01-16 NOTE — Progress Notes (Signed)
Subjective:     Patient ID: Aaron Clarke, male   DOB: 09-May-1956, 56 y.o.   MRN: [id number]  HPI  This gentleman underwent elective laparoscopic cholecystectomy with intraoperative cholangiogram on December 19, 2011 for histologically confirmed chronic cholecystitis with cholelithiasis. He has done well. He says that his gallbladder attacks have gone away. No wound healing problems. Appetite normal. No diarrhea. Review of Systems     Objective:   Physical Exam Patient is morbidly obese. Externally. In no distress.  Abdomen: Obese. Soft. Nontender. All trocar sites and wounds well healed.    Assessment:     Chronic cholecystitis with cholelithiasis, uneventful recovery following laparoscopic cholecystectomy with cholangiogram    Plan:     Okay to resume normal physical activities, no restriction. Low-fat diet. Return to see me p.r.n.   Angelia Mould. Derrell Lolling, M.D., [facility] General and Minimally invasive Surgery Breast and Colorectal Surgery Office:   [phone] Pager:   [phone]

## 2012-01-16 NOTE — Patient Instructions (Signed)
You have recovered from your gallbladder surgery without any obvious complications. I advise a low fat diet. You may resume all normal physical activities without restriction. Return to see Dr. Derrell Lolling if any new problems arise.

## 2012-04-29 ENCOUNTER — Emergency Department (HOSPITAL_COMMUNITY)
Admission: EM | Admit: 2012-04-29 | Discharge: 2012-04-30 | Disposition: A | Discharge status: Home / Self Care | Payer: BC Managed Care – PPO | Attending: Emergency Medicine | Admitting: Emergency Medicine

## 2012-04-29 ENCOUNTER — Encounter (HOSPITAL_COMMUNITY): Payer: Self-pay | Admitting: *Deleted

## 2012-04-29 DIAGNOSIS — K2990 Gastroduodenitis, unspecified, without bleeding: Secondary | ICD-10-CM | POA: Insufficient documentation

## 2012-04-29 DIAGNOSIS — J45909 Unspecified asthma, uncomplicated: Secondary | ICD-10-CM | POA: Insufficient documentation

## 2012-04-29 DIAGNOSIS — K2970 Gastritis, unspecified, without bleeding: Secondary | ICD-10-CM | POA: Insufficient documentation

## 2012-04-29 DIAGNOSIS — Z87442 Personal history of urinary calculi: Secondary | ICD-10-CM | POA: Insufficient documentation

## 2012-04-29 DIAGNOSIS — R1011 Right upper quadrant pain: Secondary | ICD-10-CM | POA: Insufficient documentation

## 2012-04-29 LAB — URINALYSIS, ROUTINE W REFLEX MICROSCOPIC
Glucose, UA: NEGATIVE mg/dL
Ketones, ur: NEGATIVE mg/dL
Protein, ur: NEGATIVE mg/dL
pH: 5 (ref 5.0–8.0)

## 2012-04-29 LAB — DIFFERENTIAL
Basophils Absolute: 0 10*3/uL (ref 0.0–0.1)
Basophils Relative: 0 % (ref 0–1)
Eosinophils Relative: 3 % (ref 0–5)
Lymphocytes Relative: 24 % (ref 12–46)
Monocytes Absolute: 1.2 10*3/uL — ABNORMAL HIGH (ref 0.1–1.0)
Monocytes Relative: 11 % (ref 3–12)
Neutro Abs: 6.4 10*3/uL (ref 1.7–7.7)

## 2012-04-29 LAB — COMPREHENSIVE METABOLIC PANEL
AST: 20 U/L (ref 0–37)
BUN: 17 mg/dL (ref 6–23)
CO2: 23 mEq/L (ref 19–32)
Calcium: 9.5 mg/dL (ref 8.4–10.5)
Chloride: 100 mEq/L (ref 96–112)
Creatinine, Ser: 1.17 mg/dL (ref 0.50–1.35)
GFR calc non Af Amer: 70 mL/min — ABNORMAL LOW (ref >90)
Total Bilirubin: 0.5 mg/dL (ref 0.3–1.2)

## 2012-04-29 LAB — URINE MICROSCOPIC-ADD ON

## 2012-04-29 LAB — CBC
HCT: 49.2 % (ref 39.0–52.0)
Hemoglobin: 16.9 g/dL (ref 13.0–17.0)
MCHC: 34.3 g/dL (ref 30.0–36.0)
MCV: 85.7 fL (ref 78.0–100.0)
RDW: 13.8 % (ref 11.5–15.5)

## 2012-04-29 LAB — LIPASE, BLOOD: Lipase: 28 U/L (ref 11–59)

## 2012-04-29 MED ORDER — ONDANSETRON HCL 4 MG/2ML IJ SOLN
4.0000 mg | Freq: Once | INTRAMUSCULAR | Status: AC
  Administered 2012-04-30: 4 mg via INTRAVENOUS
  Filled 2012-04-29: qty 2

## 2012-04-29 MED ORDER — HYDROMORPHONE HCL PF 1 MG/ML IJ SOLN
1.0000 mg | Freq: Once | INTRAMUSCULAR | Status: AC
  Administered 2012-04-30: 1 mg via INTRAVENOUS
  Filled 2012-04-29: qty 1

## 2012-04-29 NOTE — ED Notes (Signed)
AIDET performed. 

## 2012-04-29 NOTE — ED Notes (Signed)
Rt abd pain for  2 weeks .  Worse since last pm.  No nv or diarrhea

## 2012-04-29 NOTE — ED Notes (Signed)
Called patient and No ANSWER x1

## 2012-04-29 NOTE — ED Provider Notes (Signed)
History     CSN: [id number]  Arrival date & time 04/29/12  2043   First MD Initiated Contact with Patient 04/29/12 2348      Chief Complaint  Patient presents with  . Abdominal Pain    (Consider location/radiation/quality/duration/timing/severity/associated sxs/prior treatment) HPI  Pt to the ED with complaints of abdominal pain in the RUQ. The patient had a cholecystectomy in Jan 2013 also with cholangiogram, where no stones were found left in the ducts. Pt had a recent CT scan of the abdomen within the past 6 months which showed no kidney stones. The patients pain has been persisting for 2 weeks but he admits that last night it become almost unbearable and has continued throughout today. He is unable to describe the pain but admits it is different than his gallbladder pain. He also denies having back pain, nausea, vomiting or diarrhea. Denies shortness of breath, chest pain, recent illness, pain does not worsen with exertion.   Past Medical History  Diagnosis Date  . Asthma   . Family history of breast cancer     mother  . Cholelithiases   . Chronic kidney disease     Kidney stone 04/2011    Past Surgical History  Procedure Date  . Colonscopy   . Fatty tumor 2003    L chest  . Cholecystectomy 12/19/2011    Procedure: LAPAROSCOPIC CHOLECYSTECTOMY WITH INTRAOPERATIVE CHOLANGIOGRAM;  Surgeon: Ernestene Mention, MD;  Location: [facility] OR;  Service: General;  Laterality: N/A;    Family History  Problem Relation Age of Onset  . Cancer Mother     breast  . Cancer Father     colon  . Heart disease Father   . Asthma Sister   . Anesthesia problems Neg Hx     History  Substance Use Topics  . Smoking status: Former Smoker -- 14 years    Quit date: 06/19/1988  . Smokeless tobacco: Not on file  . Alcohol Use: Yes     2 -3 per day      Review of Systems   HEENT: denies blurry vision or change in hearing PULMONARY: Denies difficulty breathing and SOB CARDIAC: denies chest  pain or heart palpitations MUSCULOSKELETAL:  denies being unable to ambulate ABDOMEN AL: denies nausea, vomiting or diarrhea GU: denies loss of bowel or urinary control NEURO: denies numbness and tingling in extremities SKIN: no new rashes PSYCH: patient behavior is normal NECK: Not complaining of neck pain     Allergies  Review of patient's allergies indicates no known allergies.  Home Medications   Current Outpatient Rx  Name Route Sig Dispense Refill  . OMEPRAZOLE 20 MG PO CPDR Oral Take 1 capsule (20 mg total) by mouth daily. 30 capsule 0    BP 131/83  Pulse 87  Temp(Src) 97 F (36.1 C) (Oral)  Resp 18  SpO2 98%  Physical Exam  Nursing note and vitals reviewed. Constitutional: He appears well-developed and well-nourished. No distress.  HENT:  Head: Normocephalic and atraumatic.  Eyes: Pupils are equal, round, and reactive to light.  Neck: Normal range of motion. Neck supple.  Cardiovascular: Normal rate and regular rhythm.   Pulmonary/Chest: Effort normal.  Abdominal: Soft. He exhibits no distension. There is tenderness (RUQ). There is no rebound, no CVA tenderness, no tenderness at McBurney's point and negative Murphy's sign.       Abdominal exam limited by large omentum.  Neurological: He is alert.  Skin: Skin is warm and dry.    ED  Course  Procedures (including critical care time)  Labs Reviewed  URINALYSIS, ROUTINE W REFLEX MICROSCOPIC - Abnormal; Notable for the following:    Hgb urine dipstick SMALL (*)    All other components within normal limits  DIFFERENTIAL - Abnormal; Notable for the following:    Monocytes Absolute 1.2 (*)    All other components within normal limits  COMPREHENSIVE METABOLIC PANEL - Abnormal; Notable for the following:    Glucose, Bld 117 (*)    GFR calc non Af Amer 70 (*)    GFR calc Af Amer 81 (*)    All other components within normal limits  CBC  LIPASE, BLOOD  URINE MICROSCOPIC-ADD ON   Dg Abd Acute  W/chest  04/30/2012  *RADIOLOGY REPORT*  Clinical Data: Right upper quadrant pain  ACUTE ABDOMEN SERIES (ABDOMEN 2 VIEW & CHEST 1 VIEW)  Comparison: 12/18/2011 chest radiograph, 05/23/2011 CT  Findings: Calcified left hilar lymph node.  Heart size within normal limits.  Calcified left lower lobe nodule.  No focal consolidation.  Nonobstructive bowel gas pattern with relative paucity of small bowel gas.  Surgical clips right upper quadrant.  Moderate stool burden.  Multilevel degenerative changes.  IMPRESSION: Nonobstructive bowel gas pattern.  Original Report Authenticated By: Waneta Martins, M.D.     1. Gastritis       MDM  KUB ordered to r/o obsturction since pt had recent abd surgery. Previous imaging shows that patient does not had kidney stones or stones left in bile duct.   Pt given 1mg  IV Dilaudid and 4mg  IV Zofran  Patient evaluated by Dr. Hyacinth Meeker as well  Patient is requiring aggressive acid control.   12:45 AM- patients pain has remained completely resolved with one round of pain medication. He rates his pain at a 1/10. Abdomen no longer tender.   1:25 AM- acute abdominal series shows non obstructive bowel gas pattern.  Patient given an Rx for Omeprazole and a referral to [facility] if abdominal pain persists.  Pt abdomen examined just prior to discharge, still benign abdominal exam.  Pt has been advised of the symptoms that warrant their return to the ED. Patient has voiced understanding and has agreed to follow-up with the PCP or specialist.  Dorthula Matas, PA 04/30/12 0131

## 2012-04-30 ENCOUNTER — Emergency Department (HOSPITAL_COMMUNITY): Payer: BC Managed Care – PPO

## 2012-04-30 MED ORDER — PANTOPRAZOLE SODIUM 20 MG PO TBEC
20.0000 mg | DELAYED_RELEASE_TABLET | Freq: Once | ORAL | Status: AC
  Administered 2012-04-30: 20 mg via ORAL
  Filled 2012-04-30: qty 1

## 2012-04-30 MED ORDER — OMEPRAZOLE 20 MG PO CPDR: 20.0000 mg | DELAYED_RELEASE_CAPSULE | Freq: Every day | ORAL | Status: DC

## 2012-04-30 NOTE — Discharge Instructions (Signed)
Gastritis Gastritis is an inflammation (the body's way of reacting to injury and/or infection) of the stomach. It is often caused by viral or bacterial (germ) infections. It can also be caused by chemicals (including alcohol) and medications. This illness may be associated with generalized malaise (feeling tired, not well), cramps, and fever. The illness may last 2 to 7 days. If symptoms of gastritis continue, gastroscopy (looking into the stomach with a telescope-like instrument), biopsy (taking tissue samples), and/or blood tests may be necessary to determine the cause. Antibiotics will not affect the illness unless there is a bacterial infection present. One common bacterial cause of gastritis is an organism known as H. Pylori. This can be treated with antibiotics. Other forms of gastritis are caused by too much acid in the stomach. They can be treated with medications such as H2 blockers and antacids. Home treatment is usually all that is needed. Young children will quickly become dehydrated (loss of body fluids) if vomiting and diarrhea are both present. Medications may be given to control nausea. Medications are usually not given for diarrhea unless especially bothersome. Some medications slow the removal of the virus from the gastrointestinal tract. This slows down the healing process. HOME CARE INSTRUCTIONS Home care instructions for nausea and vomiting:  For adults: drink small amounts of fluids often. Drink at least 2 quarts a day. Take sips frequently. Do not drink large amounts of fluid at one time. This may worsen the nausea.   Only take over-the-counter or prescription medicines for pain, discomfort, or fever as directed by your caregiver.   Drink clear liquids only. Those are anything you can see through such as water, broth, or soft drinks.   Once you are keeping clear liquids down, you may start full liquids, soups, juices, and ice cream or sherbet. Slowly add bland (plain, not spicy)  foods to your diet.  Home care instructions for diarrhea:  Diarrhea can be caused by bacterial infections or a virus. Your condition should improve with time, rest, fluids, and/or anti-diarrheal medication.   Until your diarrhea is under control, you should drink clear liquids often in small amounts. Clear liquids include: water, broth, jell-o water and weak tea.  Avoid:  Milk.   Fruits.   Tobacco.   Alcohol.   Extremely hot or cold fluids.   Too much intake of anything at one time.  When your diarrhea stops you may add the following foods, which help the stool to become more formed:  Rice.   Bananas.   Apples without skin.   Dry toast.  Once these foods are tolerated you may add low-fat yogurt and low-fat cottage cheese. They will help to restore the normal bacterial balance in your bowel. Wash your hands well to avoid spreading bacteria (germ) or virus. SEEK IMMEDIATE MEDICAL CARE IF:   You are unable to keep fluids down.   Vomiting or diarrhea become persistent (constant).   Abdominal pain develops, increases, or localizes. (Right sided pain can be appendicitis. Left sided pain in adults can be diverticulitis.)   You develop a fever (an oral temperature above 102 F (38.9 C)).   Diarrhea becomes excessive or contains blood or mucus.   You have excessive weakness, dizziness, fainting or extreme thirst.   You are not improving or you are getting worse.   You have any other questions or concerns.  Document Released: 10/31/2001 Document Revised: 10/26/2011 Document Reviewed: 11/06/2005 [employer] Patient Information 2012 [employer]. 

## 2012-04-30 NOTE — ED Notes (Signed)
56 year old obese male with a history of cholecystectomy and normal cholangiogram performed postop approximately 5 months ago. He presents to the hospital with right upper quadrant pain which has been vague and poorly described for several days but became more intense today. It has been persistent, right upper quadrant in location without radiation and not associated with nausea, vomiting, diarrhea or any changes in bowel habits. He denies blood in his stools, denies abdominal distention, denies coughing chest pain. He has been able to eat without any difficulty and has had improved symptoms with exertion.  On physical exam the patient is very obese but has a diffusely soft and nontender abdomen other than right upper quadrant. He has mild tenderness in the right upper quadrant without guarding and with no Murphy sign. There is no pain at McBurney's point. He has mild edema of the bilateral lower extremities, moist mucous membranes, clear lungs and normal pulses at the radial arteries.  Assessment:  Lab work is unremarkable, will obtain acute abdominal series to rule out a early bowel obstruction or ileus as the patient is postoperative approximately 5 months. That being said there is no nausea vomiting or changes in bowel habits to suggest a severe source. With normal lab work and a soft minimally tender non-peritoneal abdomen, unlikely to find definitive source today. Pain medications ordered, aggressively treat stomach acid as outpatient.  Medical screening examination/treatment/procedure(s) were conducted as a shared visit with non-physician practitioner(s) and myself.  I personally evaluated the patient during the encounter    Vida Roller, MD 04/30/12 [phone]

## 2012-05-01 NOTE — ED Provider Notes (Signed)
Medical screening examination/treatment/procedure(s) were conducted as a shared visit with non-physician practitioner(s) and myself.  I personally evaluated the patient during the encounter  Please see my separate respective documentation pertaining to this patient encounter   Vida Roller, MD 05/01/12 [phone]

## 2017-07-06 ENCOUNTER — Encounter (HOSPITAL_COMMUNITY): Payer: Self-pay | Admitting: Emergency Medicine

## 2017-07-06 ENCOUNTER — Emergency Department (HOSPITAL_COMMUNITY)
Admission: EM | Admit: 2017-07-06 | Discharge: 2017-07-06 | Disposition: A | Discharge status: Home / Self Care | Payer: Self-pay | Attending: Emergency Medicine | Admitting: Emergency Medicine

## 2017-07-06 ENCOUNTER — Emergency Department (HOSPITAL_COMMUNITY): Payer: Self-pay

## 2017-07-06 DIAGNOSIS — Y929 Unspecified place or not applicable: Secondary | ICD-10-CM | POA: Insufficient documentation

## 2017-07-06 DIAGNOSIS — Z87891 Personal history of nicotine dependence: Secondary | ICD-10-CM | POA: Insufficient documentation

## 2017-07-06 DIAGNOSIS — Y9389 Activity, other specified: Secondary | ICD-10-CM | POA: Insufficient documentation

## 2017-07-06 DIAGNOSIS — S46912A Strain of unspecified muscle, fascia and tendon at shoulder and upper arm level, left arm, initial encounter: Secondary | ICD-10-CM | POA: Insufficient documentation

## 2017-07-06 DIAGNOSIS — J45909 Unspecified asthma, uncomplicated: Secondary | ICD-10-CM | POA: Insufficient documentation

## 2017-07-06 DIAGNOSIS — Y998 Other external cause status: Secondary | ICD-10-CM | POA: Insufficient documentation

## 2017-07-06 DIAGNOSIS — R0789 Other chest pain: Secondary | ICD-10-CM | POA: Insufficient documentation

## 2017-07-06 DIAGNOSIS — Y33XXXA Other specified events, undetermined intent, initial encounter: Secondary | ICD-10-CM | POA: Insufficient documentation

## 2017-07-06 LAB — BASIC METABOLIC PANEL
Anion gap: 10 (ref 5–15)
BUN: 14 mg/dL (ref 6–20)
CHLORIDE: 104 mmol/L (ref 101–111)
CO2: 24 mmol/L (ref 22–32)
Calcium: 9.1 mg/dL (ref 8.9–10.3)
Creatinine, Ser: 1.32 mg/dL — ABNORMAL HIGH (ref 0.61–1.24)
GFR, EST NON AFRICAN AMERICAN: 58 mL/min — AB (ref >60)
Glucose, Bld: 139 mg/dL — ABNORMAL HIGH (ref 65–99)
Potassium: 3.8 mmol/L (ref 3.5–5.1)
SODIUM: 138 mmol/L (ref 135–145)

## 2017-07-06 LAB — CBC
HEMATOCRIT: 47.1 % (ref 39.0–52.0)
Hemoglobin: 15.6 g/dL (ref 13.0–17.0)
MCH: 29.1 pg (ref 26.0–34.0)
MCHC: 33.1 g/dL (ref 30.0–36.0)
MCV: 87.9 fL (ref 78.0–100.0)
Platelets: 186 10*3/uL (ref 150–400)
RBC: 5.36 MIL/uL (ref 4.22–5.81)
RDW: 14.3 % (ref 11.5–15.5)
WBC: 8.6 10*3/uL (ref 4.0–10.5)

## 2017-07-06 LAB — I-STAT TROPONIN, ED
Troponin i, poc: 0.01 ng/mL (ref 0.00–0.08)
Troponin i, poc: 0.03 ng/mL (ref 0.00–0.08)

## 2017-07-06 NOTE — ED Provider Notes (Signed)
[facility] DEPT Provider Note   CSN: [id number] Arrival date & time: 07/06/17  0026     History   Chief Complaint Chief Complaint  Patient presents with  . Chest Pain    HPI Aaron Clarke is a 61 y.o. male.  Reports pain originating in the left shoulder which started 2 days ago. Exacerbated with movement and left arm ROM. Chest pain began after left shoulder pain got worse last night. States that he had been doing over reaching several days ago while putting up a tent and that's when shoulder pain started.   The history is provided by the patient.  Chest Pain   This is a new problem. Episode onset: 10 hrs. The problem occurs constantly. The problem has been gradually improving. The pain is associated with movement. The pain is present in the lateral region. The pain is moderate. The quality of the pain is described as stabbing. The pain does not radiate. The symptoms are aggravated by certain positions. Associated symptoms include cough and shortness of breath. Pertinent negatives include no diaphoresis, no fever, no lower extremity edema, no nausea, no sputum production and no vomiting. He has tried nothing for the symptoms.  Pertinent negatives for past medical history include no CAD, no diabetes, no DVT, no hyperlipidemia, no hypertension and no strokes.  Procedure history is negative for cardiac catheterization, stress echo, stress thallium and exercise treadmill test.    Past Medical History:  Diagnosis Date  . Asthma   . Cholelithiases   . Chronic kidney disease    Kidney stone 04/2011  . Family history of breast cancer    mother    Patient Active Problem List   Diagnosis Date Noted  . Cholecystitis with cholelithiasis 12/19/2011  . Obesity 12/14/2011  . Asthma 12/14/2011    Past Surgical History:  Procedure Laterality Date  . CHOLECYSTECTOMY  12/19/2011   Procedure: LAPAROSCOPIC CHOLECYSTECTOMY WITH INTRAOPERATIVE CHOLANGIOGRAM;  Surgeon: Adin Hector, MD;  Location: [facility];  Service: General;  Laterality: N/A;  . Colonscopy    . Fatty tumor  2003   L chest       Home Medications    Prior to Admission medications   Medication Sig Start Date End Date Taking? Authorizing Provider  omeprazole (PRILOSEC) 20 MG capsule Take 1 capsule (20 mg total) by mouth daily. 04/30/12 04/30/13  Delos Haring, PA-C    Family History Family History  Problem Relation Age of Onset  . Cancer Mother        breast  . Cancer Father        colon  . Heart disease Father   . Asthma Sister   . Anesthesia problems Neg Hx     Social History Social History  Substance Use Topics  . Smoking status: Former Smoker    Years: 14.00    Quit date: 06/19/1988  . Smokeless tobacco: Never Used  . Alcohol use Yes     Comment: 2 -3 per day     Allergies   Patient has no known allergies.   Review of Systems Review of Systems  Constitutional: Negative for diaphoresis and fever.  Respiratory: Positive for cough and shortness of breath. Negative for sputum production.   Cardiovascular: Positive for chest pain.  Gastrointestinal: Negative for nausea and vomiting.  All other systems are reviewed and are negative for acute change except as noted in the HPI    Physical Exam Updated Vital Signs BP 137/84   Pulse 80   Temp  98.1 F (36.7 C)   Resp 18   Ht 6' (1.829 m)   Wt 131.1 kg (289 lb)   SpO2 97%   BMI 39.20 kg/m   Physical Exam  Constitutional: He is oriented to person, place, and time. He appears well-developed and well-nourished. No distress.  HENT:  Head: Normocephalic and atraumatic.  Nose: Nose normal.  Eyes: Pupils are equal, round, and reactive to light. Conjunctivae and EOM are normal. Right eye exhibits no discharge. Left eye exhibits no discharge. No scleral icterus.  Neck: Normal range of motion. Neck supple.  Cardiovascular: Normal rate and regular rhythm.  Exam reveals no gallop and no friction rub.   No murmur  heard. Pulmonary/Chest: Effort normal and breath sounds normal. No stridor. No respiratory distress. He has no rales.    Abdominal: Soft. He exhibits no distension. There is no tenderness.  Musculoskeletal: He exhibits no edema.       Left shoulder: He exhibits tenderness. He exhibits no bony tenderness.       Cervical back: He exhibits tenderness.       Back:  Neurological: He is alert and oriented to person, place, and time.  Skin: Skin is warm and dry. No rash noted. He is not diaphoretic. No erythema.  Psychiatric: He has a normal mood and affect.  Vitals reviewed.    ED Treatments / Results  Labs (all labs ordered are listed, but only abnormal results are displayed) Labs Reviewed  BASIC METABOLIC PANEL - Abnormal; Notable for the following:       Result Value   Glucose, Bld 139 (*)    Creatinine, Ser 1.32 (*)    GFR calc non Af Amer 58 (*)    All other components within normal limits  CBC  I-STAT TROPONIN, ED  I-STAT TROPONIN, ED    EKG  EKG Interpretation  Date/Time:  Friday July 06 2017 00:28:59 EDT Ventricular Rate:  82 PR Interval:  142 QRS Duration: 112 QT Interval:  370 QTC Calculation: 432 R Axis:   75 Text Interpretation:  Normal sinus rhythm Incomplete right bundle branch block Borderline ECG No significant change since last tracing Confirmed by Addison Lank [id number]) on 07/06/2017 7:56:47 AM       Radiology Dg Chest 2 View  Result Date: 07/06/2017 CLINICAL DATA:  Shortness of breath with shoulder pain, chest pain EXAM: CHEST  2 VIEW COMPARISON:  12/18/2011 FINDINGS: Mild hyperinflation. Small nodular opacity at the right base. Normal heart size. No pneumothorax. Degenerative changes of the spine. Calcified hilar nodes. IMPRESSION: No active cardiopulmonary disease. Small nodular opacity at the right base possible summation shadow, suggest short interval radiographic follow-up Electronically Signed   By: Donavan Foil M.D.   On: 07/06/2017 01:28     Procedures Procedures (including critical care time)  Medications Ordered in ED Medications - No data to display   Initial Impression / Assessment and Plan / ED Course  I have reviewed the triage vital signs and the nursing notes.  Pertinent labs & imaging results that were available during my care of the patient were reviewed by me and considered in my medical decision making (see chart for details).     Atypical chest pain. EKG without acute ischemic changes or evidence of pericarditis.  Initial troponin negative.HEAR <4. Patient is appropriate for delta troponin. This will be at hour 10 given his onset. Delta troponin is negative. Feel this is appropriate to rule out ACS for this patient.  Low pretest probability for pulmonary  embolism. Presentation not classic for aortic dissection or esophageal perforation.  Chest x-ray without evidence suggestive of pneumonia, pneumothorax, pneumomediastinum.  No abnormal contour of the mediastinum to suggest dissection. No evidence of acute injuries. Patient did have incidental right lower lobe nodule identified on chest x-ray, which she was made aware of. Instructed to follow-up with his primary care provider for repeat imaging.  With regards to the patient's left shoulder pain, this is most consistent with muscle strain. He denied any trauma concerning for dislocation or fracture. This may also be the cause of the patient's chest and back pain.  I feel the patient is safe for discharge with strict return precautions. Recommended patient follow up closely with his primary care provider for stress testing within 30 days.   Final Clinical Impressions(s) / ED Diagnoses   Final diagnoses:  Atypical chest pain  Left shoulder strain, initial encounter   Disposition: Discharge  Condition: Good  I have discussed the results, Dx and Tx plan with the patient who expressed understanding and agree(s) with the plan. Discharge instructions  discussed at great length. The patient was given strict return precautions who verbalized understanding of the instructions. No further questions at time of discharge.    New Prescriptions   No medications on file    Follow Up: Harlow Asa, MD [place] [address] [phone]  Schedule an appointment as soon as possible for a visit  To set up repeat imaging. Incidental finding and to schedule a stress test within 30 days      Cardama, Grayce Sessions, MD 07/06/17 [phone]

## 2017-07-06 NOTE — Discharge Instructions (Addendum)
.  During the workup we noted incidental findings on your imaging that would require you to follow-up with your regular doctor for further evaluation/management:  1. Small nodular opacity at the  right base possible summation shadow, suggest short interval  radiographic follow-

## 2017-07-06 NOTE — ED Triage Notes (Signed)
Patient reports left chest pain radiating to left neck and left shoulder this evening with SOB and dry cough , denies nausea or diaphoresis .

## 2021-05-20 ENCOUNTER — Encounter: Payer: Self-pay | Admitting: Plastic Surgery

## 2021-05-20 ENCOUNTER — Ambulatory Visit (INDEPENDENT_AMBULATORY_CARE_PROVIDER_SITE_OTHER): Payer: Self-pay | Admitting: Plastic Surgery

## 2021-05-20 ENCOUNTER — Other Ambulatory Visit: Payer: Self-pay

## 2021-05-20 DIAGNOSIS — L719 Rosacea, unspecified: Secondary | ICD-10-CM

## 2021-05-20 DIAGNOSIS — L711 Rhinophyma: Secondary | ICD-10-CM | POA: Insufficient documentation

## 2021-05-20 NOTE — Progress Notes (Signed)
Patient ID: Aaron Clarke, male    DOB: 1956-06-22, 65 y.o.   MRN: [id number]   Chief Complaint  Patient presents with   Advice Only    The patient is a 65 year old male here for evaluation of his face.  He has noticed that over the past 10 years his rosacea has gotten significantly worse.  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  He does not have a family history of skin cancer or a personal history of skin cancer.  He was a former smoker but has been quit for several years.  He does not have diabetes.  He is 6 feet tall and weighs 282 pounds.  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  He has been treated with some creams and antibiotic ointments on his face that has helped with the acne some.  He would be a grade 3.  He has not had any laser treatment as of yet.  He is very concerned that this could get much worse.  On exam he is got significant rhinophyma and rosacea.  He has a history of liver disease.   Review of Systems  Constitutional:  Negative for activity change and appetite change.  Eyes:  Positive for redness.  Respiratory:  Negative for chest tightness.   Cardiovascular:  Negative for leg swelling.  Gastrointestinal:  Negative for abdominal distention.  Endocrine: Negative.   Genitourinary: Negative.   Skin:  Positive for color change.  Hematological: Negative.   Psychiatric/Behavioral: Negative.     Past Medical History:  Diagnosis Date   Asthma    Cholelithiases    Chronic kidney disease    Kidney stone 04/2011   Family history of breast cancer    mother    Past Surgical History:  Procedure Laterality Date   CHOLECYSTECTOMY  12/19/2011   Procedure: LAPAROSCOPIC CHOLECYSTECTOMY WITH INTRAOPERATIVE CHOLANGIOGRAM;  Surgeon: Adin Hector, MD;  Location: [facility];  Service: General;  Laterality: N/A;   Colonscopy     Fatty tumor  2003   L chest      Current Outpatient Medications:    doxycycline (MONODOX) 100 MG capsule, Take 100 mg by  mouth 2 (two) times daily., Disp: , Rfl:    metroNIDAZOLE (METROCREAM) 0.75 % cream, Apply topically 2 (two) times daily., Disp: , Rfl:    Objective:   Vitals:   05/20/21 1237  BP: (!) 148/93  Pulse: 87  SpO2: 96%    Physical Exam Vitals and nursing note reviewed.  Constitutional:      Appearance: Normal appearance.  HENT:     Head: Normocephalic and atraumatic.  Cardiovascular:     Rate and Rhythm: Normal rate.     Pulses: Normal pulses.  Pulmonary:     Effort: Pulmonary effort is normal.  Abdominal:     General: Abdomen is flat.  Musculoskeletal:        General: No swelling or deformity. Normal range of motion.  Skin:    General: Skin is warm.     Coloration: Skin is not jaundiced.     Findings: Erythema present.  Neurological:     General: No focal deficit present.     Mental Status: He is alert.    Assessment & Plan:  Rhinophyma  Rosacea  We discussed options for treatment from minimally invasive to invasive.  I think that the patient would benefit greatly from the IPL or BBL treatment for his facial rosacea.  We could try  it on his nose although I am not sure how much it will help.  The halo laser may help his nose.  The other option is much more dramatic and would be de-epithelializing his entire nose and skin grafting it.  The patient is in agreement to try noninvasive procedures first.  I would like to do a 1 trial BBL and see how he does with that.  Pictures were obtained of the patient and placed in the chart with the patient's or guardian's permission.   [place], DO

## 2021-07-29 ENCOUNTER — Ambulatory Visit (INDEPENDENT_AMBULATORY_CARE_PROVIDER_SITE_OTHER): Payer: Self-pay | Admitting: Plastic Surgery

## 2021-07-29 ENCOUNTER — Other Ambulatory Visit: Payer: Self-pay

## 2021-07-29 DIAGNOSIS — L711 Rhinophyma: Secondary | ICD-10-CM

## 2021-07-29 DIAGNOSIS — L719 Rosacea, unspecified: Secondary | ICD-10-CM

## 2021-07-29 NOTE — Progress Notes (Signed)
[place]  Preoperative Dx: Severe rosacea of the face  Postoperative Dx:  same  Procedure: laser to face  Anesthesia: none  Description of Procedure:  Risks and complications were explained to the patient. Consent was confirmed and signed. Time out was called and all information was confirmed to be correct. The area  area was prepped with alcohol and wiped dry. The BBL laser was set at 7 J/cm2. The face was lasered. The patient tolerated the procedure well and there were no complications. The patient is to follow up in 4 weeks.

## 2021-08-19 ENCOUNTER — Ambulatory Visit (INDEPENDENT_AMBULATORY_CARE_PROVIDER_SITE_OTHER): Payer: Self-pay | Admitting: Plastic Surgery

## 2021-08-19 ENCOUNTER — Other Ambulatory Visit: Payer: Self-pay

## 2021-08-19 ENCOUNTER — Encounter: Payer: Self-pay | Admitting: Plastic Surgery

## 2021-08-19 DIAGNOSIS — L711 Rhinophyma: Secondary | ICD-10-CM

## 2021-08-19 DIAGNOSIS — L719 Rosacea, unspecified: Secondary | ICD-10-CM

## 2021-08-19 NOTE — Progress Notes (Signed)
Preoperative Dx: rosacea  Postoperative Dx:  same  Procedure: laser to face   Anesthesia: none  Description of Procedure:  Risks and complications were explained to the patient. Consent was confirmed and signed. Time out was called and all information was confirmed to be correct. The area  area was prepped with alcohol and wiped dry. The BBL laser was set at 7 J/cm2. The face was lasered. The patient tolerated the procedure well and there were no complications. The patient is to follow up in 4 weeks.

## 2021-09-16 ENCOUNTER — Telehealth: Payer: Self-pay | Admitting: Plastic Surgery

## 2021-09-16 NOTE — Telephone Encounter (Signed)
Called and [facility] @ 4:23pm) regarding the patient's message below.//AB/CMA

## 2021-09-16 NOTE — Telephone Encounter (Signed)
Patient is requesting a refill on his antibiotic to be sent to his [facility] [place] on [facility]. Please call to update [phone]. Thank you.

## 2021-09-20 ENCOUNTER — Ambulatory Visit (INDEPENDENT_AMBULATORY_CARE_PROVIDER_SITE_OTHER): Payer: Self-pay | Admitting: Plastic Surgery

## 2021-09-20 ENCOUNTER — Other Ambulatory Visit: Payer: Self-pay

## 2021-09-20 DIAGNOSIS — L711 Rhinophyma: Secondary | ICD-10-CM

## 2021-09-20 MED ORDER — METRONIDAZOLE 0.75 % EX CREA: TOPICAL_CREAM | Freq: Two times a day (BID) | CUTANEOUS | 0 refills | Status: DC

## 2021-09-20 MED ORDER — DOXYCYCLINE HYCLATE 100 MG PO TABS: 100.0000 mg | ORAL_TABLET | Freq: Every day | ORAL | 2 refills | Status: DC

## 2021-09-20 NOTE — Addendum Note (Signed)
Addended by: Wallace Going on: 09/20/2021 10:55 AM   Modules accepted: Orders

## 2021-09-20 NOTE — Progress Notes (Signed)
Preoperative Dx: rosacea   Postoperative Dx:  same  Procedure: laser to face   Anesthesia: none  Description of Procedure:  Risks and complications were explained to the patient. Consent was confirmed and signed. Time out was called and all information was confirmed to be correct. The area  area was prepped with alcohol and wiped dry. The BBL laser was set at 7 J/cm2. The face and nose were lasered. The patient tolerated the procedure well and there were no complications. The patient is to follow up in 4 weeks.

## 2021-10-19 ENCOUNTER — Ambulatory Visit (INDEPENDENT_AMBULATORY_CARE_PROVIDER_SITE_OTHER): Payer: Self-pay | Admitting: Surgical

## 2021-10-19 ENCOUNTER — Other Ambulatory Visit: Payer: Self-pay

## 2021-10-19 DIAGNOSIS — L711 Rhinophyma: Secondary | ICD-10-CM

## 2021-10-19 DIAGNOSIS — L719 Rosacea, unspecified: Secondary | ICD-10-CM

## 2021-10-19 NOTE — Progress Notes (Signed)
Patient is a 65 year old male here for follow-up on his rhinophyma and rosacea.  He has been undergoing laser treatments with Dr. Marla Roe.  He reports that he feels as if he has noticed some improvement.  He reports that he is scheduled to travel to [place] tomorrow for work.  After reviewing patient's chart and previous treatments with the BBL laser it was decided that prior to treating patient I would like to consult with Dr. Marla Roe on his current treatment plan.  When putting in patient's settings for rosacea treatment I was coming up with different energy parameters and did not feel comfortable treating the patient today.  I discussed this with the patient and he was comfortable with that decision.  He was very pleasant.  Recommend following up in January we discussed scheduling a follow-up with me or Dr. Marla Roe when Dr. Marla Roe was present so we can discuss patient's treatment plan.

## 2021-11-29 ENCOUNTER — Ambulatory Visit (INDEPENDENT_AMBULATORY_CARE_PROVIDER_SITE_OTHER): Payer: Self-pay | Admitting: Surgical

## 2021-11-29 ENCOUNTER — Other Ambulatory Visit: Payer: Self-pay

## 2021-11-29 DIAGNOSIS — Z719 Counseling, unspecified: Secondary | ICD-10-CM

## 2021-11-29 DIAGNOSIS — L711 Rhinophyma: Secondary | ICD-10-CM

## 2021-11-29 DIAGNOSIS — L719 Rosacea, unspecified: Secondary | ICD-10-CM

## 2021-11-29 NOTE — Progress Notes (Signed)
Preoperative Dx: Rosacea, Rhinophyma  Postoperative Dx:  same  Procedure: laser to face   Anesthesia: none  Description of Procedure:  Risks and complications were explained to the patient. Consent was confirmed and signed. Time out was called and all information was confirmed to be correct. The area  area was prepped with alcohol and wiped dry. The BBL 515 laser was set at 7 J/cm2. The face was lasered. The BBL 560 was then set at 7 J/cm2 and the face was lasered The patient tolerated the procedure well and there were no complications. The patient is to follow up in 4 weeks.  The face was lasered with two passes with the 515 nm and 2 passes with the 560 nm.

## 2021-12-27 ENCOUNTER — Other Ambulatory Visit: Payer: Self-pay

## 2021-12-27 ENCOUNTER — Ambulatory Visit (INDEPENDENT_AMBULATORY_CARE_PROVIDER_SITE_OTHER): Payer: Self-pay | Admitting: Surgical

## 2021-12-27 DIAGNOSIS — L711 Rhinophyma: Secondary | ICD-10-CM

## 2021-12-27 DIAGNOSIS — L719 Rosacea, unspecified: Secondary | ICD-10-CM

## 2021-12-27 NOTE — Progress Notes (Signed)
Preoperative Dx: Rosacea, rhinophyma  Postoperative Dx:  same  Procedure: laser to face  Anesthesia: none  Description of Procedure:  Risks and complications were explained to the patient. Consent was confirmed and signed. Time out was called and all information was confirmed to be correct. The area  area was prepped with alcohol and wiped dry. The 515 laser was set at 7 J/cm2.  The BBL 560 was set at 7 J/cm. the face was lasered. The patient tolerated the procedure well and there were no complications. The patient is to follow up in 4 weeks.  The face was lasered with 2 passes with a 515 nm at 7 J/cm and 2 passes with a 560 nm at 7 J/cm.  Recommend following up in 2 weeks for televisit with Dr. Marla Roe to discuss further management of rhinophyma.

## 2022-01-13 ENCOUNTER — Telehealth (INDEPENDENT_AMBULATORY_CARE_PROVIDER_SITE_OTHER): Payer: Self-pay | Admitting: Plastic Surgery

## 2022-01-13 DIAGNOSIS — L719 Rosacea, unspecified: Secondary | ICD-10-CM

## 2022-01-13 NOTE — Progress Notes (Signed)
The patient is a 66 year old male joining me by phone for further discussion about his rosacea.  We are going to do 1 more BBL and then were going to go to resurfacing.

## 2022-02-28 ENCOUNTER — Encounter: Payer: Self-pay | Admitting: Surgical

## 2022-02-28 ENCOUNTER — Ambulatory Visit (INDEPENDENT_AMBULATORY_CARE_PROVIDER_SITE_OTHER): Payer: Self-pay | Admitting: Surgical

## 2022-02-28 DIAGNOSIS — L711 Rhinophyma: Secondary | ICD-10-CM

## 2022-02-28 DIAGNOSIS — L719 Rosacea, unspecified: Secondary | ICD-10-CM

## 2022-02-28 NOTE — Progress Notes (Signed)
Preoperative Dx: Rosacea, rhinophyma ? ?Postoperative Dx:  same ? ?Procedure: laser to face ? ?Anesthesia: none ? ?Description of Procedure:  ?Risks and complications were explained to the patient. Consent was confirmed and signed. Time out was called and all information was confirmed to be correct. The area  area was prepped with alcohol and wiped dry. The 560 nm laser was set at 7 J/cm? the face was lasered with 1 pass, the 560 nm laser was then set at 8 J/cm? and the face was lasered with 1 pass.  The 515 nm laser was set at 7 J/cm? and the face was lasered with 2 passes. The patient tolerated the procedure well and there were no complications.  ? ?Patient previously discussed resurfacing laser with Dr. Marla Roe.  We will plan to have that scheduled with Dr. Marla Roe. ? ? ?

## 2022-03-17 ENCOUNTER — Ambulatory Visit
Admission: EM | Admit: 2022-03-17 | Discharge: 2022-03-17 | Disposition: A | Discharge status: Home / Self Care | Payer: Self-pay

## 2022-03-17 DIAGNOSIS — R1084 Generalized abdominal pain: Secondary | ICD-10-CM

## 2022-03-17 NOTE — Discharge Instructions (Addendum)
Follow up with your gastroenterologist as you may be due for a repeat colonoscopy. This a way to figure out where your belly pain that comes and goes is from. You can also follow up with your PCP to see if they would like to pursue an outpatient CT scan. Otherwise, if your pain returns and is severe, have fevers, bloody stools, stop being able to poop then go to the hospital as you may need a CT scan more emergently.  ?

## 2022-03-17 NOTE — ED Triage Notes (Signed)
Pt c/o RUQ abd pain states he can feel a "hard spot." Denies nausea, vomiting, diarrhea, constipation. States he had his gallbladder removed many years ago. Family hx of some type of GI cancer pt and family at bedside is concerned for cancer and want to know if an xray would help give answers. Onset several months ago.  ?

## 2022-03-17 NOTE — ED Provider Notes (Signed)
?[facility] ? ? ?MRN: [id number] DOB: July 13, 1956 ? ?Subjective:  ? ?Aaron Clarke is a 66 y.o. male presenting for several month history of persistent intermittent right-sided abdominal pain.  In the past months they have become more concerned as they feel a knot over his flank side intermittently.  No nausea, vomiting, diarrhea, constipation, bloody stools.  No unexplained fevers or weight loss, changes to appetite.  Patient is having bowel movements daily.  He is taking a fiber supplement.  Last colonoscopy was more than 10 years ago.  He does have a remote history of a cholecystectomy in 2013.  They have concerns for cancer as this runs in the family but do not have any information regarding the specific cancer in their family. ? ?No current facility-administered medications for this encounter. ? ?Current Outpatient Medications:  ?  metroNIDAZOLE (METROCREAM) 0.75 % cream, Apply topically 2 (two) times daily., Disp: , Rfl:   ? ?No Known Allergies ? ?Past Medical History:  ?Diagnosis Date  ? Asthma   ? Cholelithiases   ? Chronic kidney disease   ? Kidney stone 04/2011  ? Family history of breast cancer   ? mother  ?  ? ?Past Surgical History:  ?Procedure Laterality Date  ? CHOLECYSTECTOMY  12/19/2011  ? Procedure: LAPAROSCOPIC CHOLECYSTECTOMY WITH INTRAOPERATIVE CHOLANGIOGRAM;  Surgeon: Adin Hector, MD;  Location: [facility];  Service: General;  Laterality: N/A;  ? Colonscopy    ? Fatty tumor  2003  ? L chest  ? ? ?Family History  ?Problem Relation Age of Onset  ? Cancer Mother   ?     breast  ? Cancer Father   ?     colon  ? Heart disease Father   ? Asthma Sister   ? Anesthesia problems Neg Hx   ? ? ?Social History  ? ?Tobacco Use  ? Smoking status: Former  ?  Years: 14.00  ?  Types: Cigarettes  ?  Quit date: 06/19/1988  ?  Years since quitting: 33.7  ? Smokeless tobacco: Never  ?Substance Use Topics  ? Alcohol use: Yes  ?  Comment: 2 -3 per day  ? Drug use: No  ? ? ?ROS ? ? ?Objective:   ? ?Vitals: ?BP 138/81 (BP Location: Left Arm)   Pulse (!) 117   Temp 98.2 ?F (36.8 ?C) (Oral)   Resp 18   SpO2 95%  ? ?Physical Exam ?Constitutional:   ?   General: He is not in acute distress. ?   Appearance: Normal appearance. He is well-developed. He is obese. He is not ill-appearing, toxic-appearing or diaphoretic.  ?HENT:  ?   Head: Normocephalic and atraumatic.  ?   Right Ear: External ear normal.  ?   Left Ear: External ear normal.  ?   Nose: Nose normal.  ?   Mouth/Throat:  ?   Mouth: Mucous membranes are moist.  ?   Pharynx: Oropharynx is clear.  ?Eyes:  ?   General: No scleral icterus.    ?   Right eye: No discharge.     ?   Left eye: No discharge.  ?   Extraocular Movements: Extraocular movements intact.  ?Cardiovascular:  ?   Rate and Rhythm: Normal rate and regular rhythm.  ?   Heart sounds: Normal heart sounds. No murmur heard. ?  No friction rub. No gallop.  ?Pulmonary:  ?   Effort: Pulmonary effort is normal. No respiratory distress.  ?   Breath sounds: Normal breath sounds.  No stridor. No wheezing, rhonchi or rales.  ?Abdominal:  ?   General: Bowel sounds are normal. There is no distension.  ?   Palpations: Abdomen is soft. There is no mass.  ?   Tenderness: There is no abdominal tenderness. There is no right CVA tenderness, left CVA tenderness, guarding or rebound.  ?Musculoskeletal:  ?   Cervical back: Normal range of motion.  ?Neurological:  ?   Mental Status: He is alert and oriented to person, place, and time.  ?Psychiatric:     ?   Mood and Affect: Mood normal.     ?   Behavior: Behavior normal.     ?   Thought Content: Thought content normal.     ?   Judgment: Judgment normal.  ? ? ?Assessment and Plan :  ? ?PDMP not reviewed this encounter. ? ?1. Generalized abdominal pain   ? ?Patient has a benign abdominal exam.  There is no tenderness elicited, discussed the use of an x-ray.  They were agreeable to hold off on this as he is not having any particular bouts of constipation and I have  low suspicion for a bowel obstruction.  He is due for colonoscopy and recommended that they recheck this with their gastroenterology practice.  Also recommended that they set up a follow-up appointment with her PCP to consider whether or not they would like to pursue a CT scan of the abdomen.  In the meantime recommended healthy balanced diet.  Discussed red flags that may warrant an emergent CT scan.  Monitor for symptoms at home. Counseled patient on potential for adverse effects with medications prescribed/recommended today, ER and return-to-clinic precautions discussed, patient verbalized understanding. ? ?  ?Jaynee Eagles, PA-C ?03/17/22 1618 ? ?

## 2022-03-29 ENCOUNTER — Other Ambulatory Visit: Payer: Self-pay

## 2022-03-29 ENCOUNTER — Ambulatory Visit (INDEPENDENT_AMBULATORY_CARE_PROVIDER_SITE_OTHER): Payer: Self-pay

## 2022-03-29 ENCOUNTER — Encounter: Payer: Self-pay | Admitting: Emergency Medicine

## 2022-03-29 ENCOUNTER — Ambulatory Visit
Admission: EM | Admit: 2022-03-29 | Discharge: 2022-03-29 | Disposition: A | Discharge status: Home / Self Care | Payer: Self-pay | Attending: Physician Assistant | Admitting: Physician Assistant

## 2022-03-29 ENCOUNTER — Telehealth: Payer: Self-pay | Admitting: Plastic Surgery

## 2022-03-29 DIAGNOSIS — R051 Acute cough: Secondary | ICD-10-CM

## 2022-03-29 DIAGNOSIS — J329 Chronic sinusitis, unspecified: Secondary | ICD-10-CM

## 2022-03-29 DIAGNOSIS — J4521 Mild intermittent asthma with (acute) exacerbation: Secondary | ICD-10-CM

## 2022-03-29 DIAGNOSIS — J40 Bronchitis, not specified as acute or chronic: Secondary | ICD-10-CM

## 2022-03-29 MED ORDER — DOXYCYCLINE HYCLATE 100 MG PO CAPS: 100.0000 mg | ORAL_CAPSULE | Freq: Two times a day (BID) | ORAL | 0 refills | Status: DC

## 2022-03-29 MED ORDER — PREDNISONE 10 MG PO TABS: 20.0000 mg | ORAL_TABLET | Freq: Every day | ORAL | 0 refills | Status: AC

## 2022-03-29 MED ORDER — ALBUTEROL SULFATE HFA 108 (90 BASE) MCG/ACT IN AERS
2.0000 | INHALATION_SPRAY | Freq: Once | RESPIRATORY_TRACT | Status: AC
  Administered 2022-03-29: 2 via RESPIRATORY_TRACT

## 2022-03-29 NOTE — Telephone Encounter (Signed)
Please advise 

## 2022-03-29 NOTE — Telephone Encounter (Signed)
Patient came to office to follow up on last appt; ready to move forward with scheduling resurfacing laser appointment. Patient wants to know if that appointment needs to be scheduled with Dr. Marla Roe or if it's ok to schedule it with the PA. Please advise at [phone].  ?

## 2022-03-29 NOTE — Discharge Instructions (Signed)
Your chest x-ray was normal.  Please start doxycycline 100 mg twice daily for 10 days.  Stay out of the sun while on this medication as it can cause you to have a bad sunburn.  Take prednisone 20 mg for 4 days.  Do not take NSAIDs with this medication including aspirin, ibuprofen/Advil, naproxen/Aleve.  Use Mucinex, Flonase, Tylenol for additional symptom relief.  Continue your albuterol inhaler every 4-6 hours as needed.  If you develop any worsening symptoms including high fever, chest pain, shortness of breath, worsening cough, weakness, nausea/vomiting interfere with oral intake you need to be seen immediately.  If symptoms do not improve within a week please return or see your PCP. ?

## 2022-03-29 NOTE — ED Provider Notes (Signed)
?[facility] ? ? ? ?CSN: [id number] ?Arrival date & time: 03/29/22  1508 ? ? ?  ? ?History   ?Chief Complaint ?Chief Complaint  ?Patient presents with  ? Nasal Congestion  ? ? ?HPI ?Aaron Clarke is a 66 y.o. male.  ? ?Patient presents today with a 4 to 5-day history of URI symptoms.  Reports nasal congestion, otalgia,, shortness of breath.  Denies any fever, chest pain, nausea, vomiting.  He denies any known sick contacts.  He does have a history of asthma but does not currently have albuterol inhaler available.  He denies previous hospitalization related to asthma.  Denies any recent antibiotic use or steroids.  He has not had COVID-19 vaccine.  He has not had COVID in the past.  He has tried over-the-counter medication with minimal improvement of symptoms.  He is having difficulty sleeping due to severity of cough.  He is a former smoker quit several years ago.  He denies diagnosis of COPD or allergies. ? ? ?Past Medical History:  ?Diagnosis Date  ? Asthma   ? Cholelithiases   ? Chronic kidney disease   ? Kidney stone 04/2011  ? Family history of breast cancer   ? mother  ? ? ?Patient Active Problem List  ? Diagnosis Date Noted  ? 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 Rosacea 05/20/2021  ? Cholecystitis with cholelithiasis 12/19/2011  ? Obesity 12/14/2011  ? Asthma 12/14/2011  ? ? ?Past Surgical History:  ?Procedure Laterality Date  ? CHOLECYSTECTOMY  12/19/2011  ? Procedure: LAPAROSCOPIC CHOLECYSTECTOMY WITH INTRAOPERATIVE CHOLANGIOGRAM;  Surgeon: Adin Hector, MD;  Location: [place] [facility];  Service: General;  Laterality: N/A;  ? Colonscopy    ? Fatty tumor  2003  ? L chest  ? ? ? ? ? ?Home Medications   ? ?Prior to Admission medications   ?Medication Sig Start Date End Date Taking? Authorizing Provider  ?doxycycline (VIBRAMYCIN) 100 MG capsule Take 1 capsule (100 mg total) by mouth 2 (two) times daily. 03/29/22  Yes Tashanda Fuhrer K, PA-C  ?predniSONE (DELTASONE) 10 MG tablet Take 2 tablets (20 mg total) by mouth  daily for 4 days. 03/29/22 04/02/22 Yes Hymen Arnett, Junie Panning K, PA-C  ?metroNIDAZOLE (METROCREAM) 0.75 % cream Apply topically 2 (two) times daily. 04/07/21   [provider]  ? ? ?Family History ?Family History  ?Problem Relation Age of Onset  ? Cancer Mother   ?     breast  ? Cancer Father   ?     colon  ? Heart disease Father   ? Asthma Sister   ? Anesthesia problems Neg Hx   ? ? ?Social History ?Social History  ? ?Tobacco Use  ? Smoking status: Former  ?  Years: 14.00  ?  Types: Cigarettes  ?  Quit date: 06/19/1988  ?  Years since quitting: 33.7  ? Smokeless tobacco: Never  ?Substance Use Topics  ? Alcohol use: Yes  ?  Comment: 2 -3 per day  ? Drug use: No  ? ? ? ?Allergies   ?Patient has no known allergies. ? ? ?Review of Systems ?Review of Systems  ?Constitutional:  Positive for activity change and fatigue. Negative for appetite change and fever.  ?HENT:  Positive for congestion. Negative for sinus pressure, sneezing and sore throat.   ?Respiratory:  Positive for cough and shortness of breath.   ?Cardiovascular:  Negative for chest pain.  ?Gastrointestinal:  Negative for abdominal pain, diarrhea, nausea and vomiting.  ?Neurological:  Positive for headaches. Negative for  dizziness and light-headedness.  ? ? ?Physical Exam ?Triage Vital Signs ?ED Triage Vitals  ?Enc Vitals Group  ?   BP 03/29/22 1617 (!) 151/89  ?   Pulse Rate 03/29/22 1617 96  ?   Resp 03/29/22 1617 18  ?   Temp 03/29/22 1617 98 ?F (36.7 ?C)  ?   Temp Source 03/29/22 1617 Oral  ?   SpO2 03/29/22 1617 95 %  ?   Weight --   ?   Height --   ?   Head Circumference --   ?   Peak Flow --   ?   Pain Score 03/29/22 1618 4  ?   Pain Loc --   ?   Pain Edu? --   ?   Excl. in [place]? --   ? ?No data found. ? ?Updated Vital Signs ?BP (!) 156/88 (BP Location: Left Arm)   Pulse 96   Temp 98 ?F (36.7 ?C) (Oral)   Resp 18   SpO2 95%  ? ?Visual Acuity ?Right Eye Distance:   ?Left Eye Distance:   ?Bilateral Distance:   ? ?Right Eye Near:   ?Left Eye Near:     ?Bilateral Near:    ? ?Physical Exam ?Vitals reviewed.  ?Constitutional:   ?   General: He is awake.  ?   Appearance: Normal appearance. He is well-developed. He is not ill-appearing.  ?   Comments: Very pleasant male appears stated age in no acute distress sitting comfortably in exam room  ?HENT:  ?   Head: Normocephalic and atraumatic.  ?   Right Ear: Ear canal and external ear normal. A middle ear effusion is present. Tympanic membrane is not erythematous or bulging.  ?   Left Ear: Ear canal and external ear normal. A middle ear effusion is present. Tympanic membrane is not erythematous or bulging.  ?   Nose: Nose normal.  ?   Comments: Rhinophyma noted on exam ?   Mouth/Throat:  ?   Pharynx: Uvula midline. No oropharyngeal exudate, posterior oropharyngeal erythema or uvula swelling.  ?Cardiovascular:  ?   Rate and Rhythm: Normal rate and regular rhythm.  ?   Heart sounds: Normal heart sounds, S1 normal and S2 normal. No murmur heard. ?Pulmonary:  ?   Effort: Pulmonary effort is normal. No accessory muscle usage or respiratory distress.  ?   Breath sounds: No stridor. Examination of the right-lower field reveals wheezing. Examination of the left-lower field reveals wheezing. Wheezing present. No rhonchi or rales.  ?Abdominal:  ?   Palpations: Abdomen is soft.  ?   Tenderness: There is no abdominal tenderness.  ?Neurological:  ?   Mental Status: He is alert.  ?Psychiatric:     ?   Behavior: Behavior is cooperative.  ? ? ? ?UC Treatments / Results  ?Labs ?(all labs ordered are listed, but only abnormal results are displayed) ?Labs Reviewed - No data to display ? ?EKG ? ? ?Radiology ?DG Chest 2 View ? ?Result Date: 03/29/2022 ?CLINICAL DATA:  Cough and nasal congestion EXAM: CHEST - 2 VIEW COMPARISON:  07/06/2016 FINDINGS: Mild hyperinflation. The heart size and mediastinal contours are within normal limits. No focal pulmonary opacity. No acute osseous abnormality. IMPRESSION: No acute cardiopulmonary process.  Electronically Signed   By: Merilyn Baba M.D.   On: 03/29/2022 18:07   ? ?Procedures ?Procedures (including critical care time) ? ?Medications Ordered in UC ?Medications  ?albuterol (VENTOLIN HFA) 108 (90 Base) MCG/ACT inhaler 2 puff (2 puffs  Inhalation Given 03/29/22 1729)  ? ? ?Initial Impression / Assessment and Plan / UC Course  ?I have reviewed the triage vital signs and the nursing notes. ? ?Pertinent labs & imaging results that were available during my care of the patient were reviewed by me and considered in my medical decision making (see chart for details). ? ?  ?No indication for viral testing as patient has been symptomatic for 4 to 5 days and this would not change management by the time results are available.  Patient was given albuterol in clinic without significant improvement of his symptoms.  Given persistent wheezing and left lower lung field despite bronchodilator x-ray was obtained that showed no acute cardiopulmonary disease.  Given worsening symptoms will cover with doxycycline 100 mg twice daily for 10 days and patient was instructed to avoid prolonged sun exposure due to photosensitivity associated with this medication.  Concerned that acute illness has flared asthma and he was started on 20 mg of prednisone for 4 days.  Discussed that he should avoid NSAIDs with this medication due to risk of GI bleeding.  Recommended Mucinex, Flonase, Tylenol for symptom relief.  He is to rest and drink plenty of fluids.  He was sent home with albuterol inhaler with instruction to use this every 4-6 hours as needed.  If symptoms or not improving within a week he is to return here or see PCP.  If anything worsens he is to return immediately for further evaluation including fever, chest pain, shortness of breath, nausea/vomiting interfering with oral intake, weakness.  Strict return precautions given.  Work excuse note provided. ? ?Final Clinical Impressions(s) / UC Diagnoses  ? ?Final diagnoses:  ?Acute  cough  ?Sinobronchitis  ?Mild intermittent asthma with acute exacerbation  ? ? ? ?Discharge Instructions   ? ?  ?Your chest x-ray was normal.  Please start doxycycline 100 mg twice daily for 10 days.  Stay out of the sun whi

## 2022-03-29 NOTE — ED Triage Notes (Signed)
Pt sts nasal congestion, cough, itchy throat and ear pressure x 4 days  ?

## 2022-03-30 NOTE — Telephone Encounter (Signed)
Per Dr. Marla Roe, please schedule pt with Dr. Marla Roe June 20th. [place] staff aware. ?

## 2022-03-30 NOTE — Telephone Encounter (Signed)
Appointment scheduled. Called patient to advise (no answer); left message to return call.   ?

## 2022-05-09 ENCOUNTER — Ambulatory Visit (INDEPENDENT_AMBULATORY_CARE_PROVIDER_SITE_OTHER): Payer: Self-pay | Admitting: Plastic Surgery

## 2022-05-09 ENCOUNTER — Encounter: Payer: Self-pay | Admitting: Plastic Surgery

## 2022-05-09 DIAGNOSIS — L711 Rhinophyma: Secondary | ICD-10-CM

## 2022-05-09 NOTE — Progress Notes (Signed)
[place]  Preoperative Dx: rosacea  Postoperative Dx:  same  Procedure: laser to face   Anesthesia: none  Description of Procedure:  Risks and complications were explained to the patient. Consent was confirmed and signed. Eye protection was placed. Time out was called and all information was confirmed to be correct. The area  area was prepped with alcohol and wiped dry. The BBL laser was set at 560 nm 14 J/cm2 rosacea setting. The face was lasered. The patient tolerated the procedure well and there were no complications. The patient is to follow up in 4 weeks.

## 2022-06-09 ENCOUNTER — Ambulatory Visit (INDEPENDENT_AMBULATORY_CARE_PROVIDER_SITE_OTHER): Payer: Self-pay | Admitting: Student

## 2022-06-09 DIAGNOSIS — L719 Rosacea, unspecified: Secondary | ICD-10-CM

## 2022-06-09 NOTE — Progress Notes (Signed)
Preoperative Dx: rosacea   Postoperative Dx:  same  Procedure: laser to face   Anesthesia: none  Description of Procedure:  Risks and complications were explained to the patient. Consent was confirmed and signed. Time out was called and all information was confirmed to be correct. The area  area was prepped with alcohol and wiped dry. The BBL laser was set at 560 nm at 5 J/cm2. The face was lasered. The patient tolerated the procedure well and there were no complications.   Postprocedure care was discussed with the patient.   The patient is to follow up in 2 weeks for a televisit with Dr. Marla Roe.   Plan discussed with Dr. Marla Roe

## 2022-06-23 ENCOUNTER — Encounter: Payer: Self-pay | Admitting: Plastic Surgery

## 2022-06-23 ENCOUNTER — Ambulatory Visit: Payer: Self-pay | Admitting: Plastic Surgery

## 2022-06-23 DIAGNOSIS — L711 Rhinophyma: Secondary | ICD-10-CM

## 2022-06-23 DIAGNOSIS — L719 Rosacea, unspecified: Secondary | ICD-10-CM

## 2022-06-23 NOTE — Progress Notes (Signed)
Called several times and unable to reach the patient.  Will try again next week.

## 2022-06-23 NOTE — Progress Notes (Deleted)
   Subjective:    Patient ID: Aaron Clarke, male    DOB: 1956/06/11, 66 y.o.   MRN: [id number]  HPI    Review of Systems  Constitutional: Negative.   Eyes: Negative.   Respiratory: Negative.    Cardiovascular: Negative.  Negative for leg swelling.  Gastrointestinal: Negative.   Endocrine: Negative.   Genitourinary: Negative.   Musculoskeletal: Negative.   Neurological: Negative.   Hematological: Negative.        Objective:   Physical Exam Vitals and nursing note reviewed.  Constitutional:      Appearance: Normal appearance.  HENT:     Head: Normocephalic and atraumatic.  Cardiovascular:     Rate and Rhythm: Normal rate.     Pulses: Normal pulses.  Pulmonary:     Effort: Pulmonary effort is normal.  Abdominal:     Palpations: Abdomen is soft.  Musculoskeletal:        General: No swelling or deformity.  Skin:    General: Skin is warm.     Capillary Refill: Capillary refill takes less than 2 seconds.     Coloration: Skin is not jaundiced.     Findings: No bruising.  Neurological:     Mental Status: He is alert and oriented to person, place, and time.  Psychiatric:        Mood and Affect: Mood normal.        Behavior: Behavior normal.        Thought Content: Thought content normal.        Judgment: Judgment normal.        Assessment & Plan:  No diagnosis found.    Pictures were obtained of the patient and placed in the chart with the patient's or guardian's permission.

## 2022-07-04 ENCOUNTER — Ambulatory Visit
Admission: EM | Admit: 2022-07-04 | Discharge: 2022-07-04 | Disposition: A | Discharge status: Home / Self Care | Payer: Self-pay | Attending: Physician Assistant | Admitting: Physician Assistant

## 2022-07-04 DIAGNOSIS — M545 Low back pain, unspecified: Secondary | ICD-10-CM

## 2022-07-04 MED ORDER — CYCLOBENZAPRINE HCL 10 MG PO TABS: 10.0000 mg | ORAL_TABLET | Freq: Two times a day (BID) | ORAL | 0 refills | Status: DC | PRN

## 2022-07-04 MED ORDER — PREDNISONE 20 MG PO TABS: 40.0000 mg | ORAL_TABLET | Freq: Every day | ORAL | 0 refills | Status: AC

## 2022-07-04 NOTE — ED Provider Notes (Signed)
[name]    CSN: [id number] Arrival date & time: 07/04/22  1016      History   Chief Complaint Chief Complaint  Patient presents with   Back Pain    HPI Aaron Clarke is a 66 y.o. male.   Patient here today for evaluation of lower back pain this been ongoing for the last month but seems to be worse recently.  He reports that pain is present to his lower back more to the right.  He has not had any weakness or numbness.  He denies any bowel or bladder dysfunction.  He has not had any injury.  Movement seems to worsen pain and he does report muscle spasms at times.  He does not report treatment for symptoms.  The history is provided by the patient.  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 Authorizing Provider  cyclobenzaprine (FLEXERIL) 10 MG tablet Take 1 tablet (10 mg total) by mouth 2 (two) times daily as needed for muscle spasms. 07/04/22  Yes Francene Finders, PA-C  predniSONE (DELTASONE) 20 MG tablet Take 2 tablets (40 mg total) by mouth daily with breakfast for 5 days. 07/04/22 07/09/22 Yes Francene Finders, PA-C  doxycycline (VIBRAMYCIN) 100 MG capsule Take 1 capsule (100 mg  total) by mouth 2 (two) times daily. Patient not taking: Reported on 05/09/2022 03/29/22   Raspet, Derry Skill, PA-C  metroNIDAZOLE (METROCREAM) 0.75 % cream Apply topically 2 (two) times daily. Patient not taking: Reported on 05/09/2022 04/07/21   [provider]    Family History Family History  Problem Relation Age of Onset   Cancer Mother        breast   Cancer Father        colon   Heart disease Father    Asthma Sister    Anesthesia problems Neg Hx     Social History Social History   Tobacco Use   Smoking status: Former    Years: 14.00    Types: Cigarettes    Quit date: 06/19/1988    Years since quitting: 34.0   Smokeless tobacco: Never  Substance Use Topics   Alcohol use: Yes    Comment: 2 -3 per day   Drug use: No     Allergies   Patient has no known allergies.   Review of Systems Review of Systems  Constitutional:  Negative for chills and fever.  Eyes:  Negative for discharge and redness.  Musculoskeletal:  Positive for back pain and myalgias.  Neurological:  Negative for weakness and numbness.     Physical Exam Triage Vital  Signs ED Triage Vitals  Enc Vitals Group     BP 07/04/22 1025 120/78     Pulse Rate 07/04/22 1025 96     Resp 07/04/22 1025 18     Temp 07/04/22 1025 98.5 F (36.9 C)     Temp Source 07/04/22 1025 Oral     SpO2 07/04/22 1025 93 %     Weight --      Height --      Head Circumference --      Peak Flow --      Pain Score 07/04/22 1027 8     Pain Loc --      Pain Edu? --      Excl. in [place]? --    No data found.  Updated Vital Signs BP 120/78 (BP Location: Left Arm)   Pulse 96   Temp 98.5 F (36.9 C) (Oral)   Resp 18   SpO2 93%      Physical Exam Vitals and nursing note reviewed.  Constitutional:      General: He is not in acute distress.    Appearance: Normal appearance. He is not ill-appearing.  HENT:     Head: Normocephalic and atraumatic.  Eyes:     Conjunctiva/sclera: Conjunctivae normal.   Cardiovascular:     Rate and Rhythm: Normal rate.  Pulmonary:     Effort: Pulmonary effort is normal.  Musculoskeletal:     Comments: No TTP to thoracic or lumbar spine, no TTP across lower back  Neurological:     Mental Status: He is alert.  Psychiatric:        Mood and Affect: Mood normal.        Behavior: Behavior normal.        Thought Content: Thought content normal.      UC Treatments / Results  Labs (all labs ordered are listed, but only abnormal results are displayed) Labs Reviewed - No data to display  EKG   Radiology No results found.  Procedures Procedures (including critical care time)  Medications Ordered in UC Medications - No data to display  Initial Impression / Assessment and Plan / UC Course  I have reviewed the triage vital signs and the nursing notes.  Pertinent labs & imaging results that were available during my care of the patient were reviewed by me and considered in my medical decision making (see chart for details).    Prednisone and muscle relaxer prescribed for low back pain.  Recommended further evaluation if symptoms do not improve or worsen.  Final Clinical Impressions(s) / UC Diagnoses   Final diagnoses:  Acute right-sided low back pain without sciatica   Discharge Instructions   None    ED Prescriptions     Medication Sig Dispense Auth. Provider   predniSONE (DELTASONE) 20 MG tablet Take 2 tablets (40 mg total) by mouth daily with breakfast for 5 days. 10 tablet Ewell Poe F, PA-C   cyclobenzaprine (FLEXERIL) 10 MG tablet Take 1 tablet (10 mg total) by mouth 2 (two) times daily as needed for muscle spasms. 20 tablet Francene Finders, PA-C      PDMP not reviewed this encounter.   Francene Finders, PA-C 07/04/22 1103

## 2022-07-04 NOTE — ED Triage Notes (Signed)
Pt presents with lower back pain that has progressing over a month.

## 2022-07-06 ENCOUNTER — Encounter: Payer: Self-pay | Admitting: Plastic Surgery

## 2022-07-21 ENCOUNTER — Ambulatory Visit: Payer: Self-pay | Admitting: Plastic Surgery

## 2022-07-21 DIAGNOSIS — L719 Rosacea, unspecified: Secondary | ICD-10-CM

## 2022-07-21 NOTE — Progress Notes (Signed)
   Subjective:    Patient ID: Aaron Clarke, male    DOB: 27-Apr-1956, 66 y.o.   MRN: [id number]  HPI  Review of Systems     Objective:   Physical Exam      Assessment & Plan:

## 2022-08-07 ENCOUNTER — Ambulatory Visit (INDEPENDENT_AMBULATORY_CARE_PROVIDER_SITE_OTHER): Payer: Self-pay | Admitting: Internal Medicine

## 2022-08-07 ENCOUNTER — Encounter: Payer: Self-pay | Admitting: Internal Medicine

## 2022-08-07 VITALS — BP 134/87 | HR 90 | Ht 72.0 in | Wt 295.8 lb

## 2022-08-07 DIAGNOSIS — D136 Benign neoplasm of pancreas: Secondary | ICD-10-CM | POA: Insufficient documentation

## 2022-08-07 DIAGNOSIS — J4531 Mild persistent asthma with (acute) exacerbation: Secondary | ICD-10-CM

## 2022-08-07 DIAGNOSIS — M5440 Lumbago with sciatica, unspecified side: Secondary | ICD-10-CM

## 2022-08-07 DIAGNOSIS — L711 Rhinophyma: Secondary | ICD-10-CM

## 2022-08-07 DIAGNOSIS — Z6841 Body Mass Index (BMI) 40.0 and over, adult: Secondary | ICD-10-CM

## 2022-08-07 DIAGNOSIS — Z1211 Encounter for screening for malignant neoplasm of colon: Secondary | ICD-10-CM

## 2022-08-07 MED ORDER — DICLOFENAC SODIUM 1 % EX GEL: 2.0000 g | Freq: Four times a day (QID) | CUTANEOUS | Status: DC

## 2022-08-07 NOTE — Assessment & Plan Note (Signed)

## 2022-08-07 NOTE — Progress Notes (Signed)
New Patient Office Visit  Subjective:  Patient ID: Aaron Clarke, male    DOB: 1956/04/18  Age: 66 y.o. MRN: [id number]  CC:  Chief Complaint  Patient presents with   New Patient (Initial Visit)    Back Pain This is a new problem. The current episode started more than 1 month ago. The problem has been waxing and waning since onset. The pain is present in the sacro-iliac. The quality of the pain is described as stabbing. The pain is at a severity of 8/10. The pain is severe. The pain is Worse during the day. Associated symptoms include abdominal pain and headaches. Pertinent negatives include no chest pain, dysuria, fever or weight loss. Risk factors include obesity.   Patient presents for backpain  Past Medical History:  Diagnosis Date   Asthma    Cholelithiases    Chronic kidney disease    Kidney stone 04/2011   Family history of breast cancer    mother     Current Outpatient Medications:    cyclobenzaprine (FLEXERIL) 10 MG tablet, Take 1 tablet (10 mg total) by mouth 2 (two) times daily as needed for muscle spasms. (Patient not taking: Reported on 08/07/2022), Disp: 20 tablet, Rfl: 0   Past Surgical History:  Procedure Laterality Date   CHOLECYSTECTOMY  12/19/2011   Procedure: LAPAROSCOPIC CHOLECYSTECTOMY WITH INTRAOPERATIVE CHOLANGIOGRAM;  Surgeon: Adin Hector, MD;  Location: [facility];  Service: General;  Laterality: N/A;   Colonscopy     Fatty tumor  2003   L chest    Family History  Problem Relation Age of Onset   Cancer Mother        breast   Cancer Father        colon   Heart disease Father    Asthma Sister    Anesthesia problems Neg Hx     Social History   Socioeconomic History   Marital status: Significant Other    Spouse name: Not on file   Number of children: Not on file   Years of education: Not on file   Highest education level: Not on file  Occupational History   Not on file  Tobacco Use   Smoking status: Former    Years: 14.00     Types: Cigarettes    Quit date: 06/19/1988    Years since quitting: 34.1   Smokeless tobacco: Never  Substance and Sexual Activity   Alcohol use: Yes    Comment: 2 -3 per day   Drug use: No   Sexual activity: Not on file  Other Topics Concern   Not on file  Social History Narrative   Not on file   Social Determinants of Health   Financial Resource Strain: Not on file  Food Insecurity: Not on file  Transportation Needs: Not on file  Physical Activity: Not on file  Stress: Not on file  Social Connections: Not on file  Intimate Partner Violence: Not on file    ROS Review of Systems  Constitutional:  Negative for fever and weight loss.  Cardiovascular:  Negative for chest pain.  Gastrointestinal:  Positive for abdominal pain.  Genitourinary:  Negative for dysuria.  Musculoskeletal:  Positive for back pain.  Neurological:  Positive for headaches.    Objective:   Today's Vitals: BP 134/87   Pulse 90   Ht 6' (1.829 m)   Wt 295 lb 12.8 oz (134.2 kg)   BMI 40.12 kg/m   Physical Exam Constitutional:      Appearance:  He is obese. He is not ill-appearing.  HENT:     Head: Normocephalic.     Nose: Nose normal.  Cardiovascular:     Rate and Rhythm: Normal rate.     Pulses: Normal pulses.  Abdominal:     General: Bowel sounds are normal. There is distension.     Palpations: Abdomen is soft.  Neurological:     Mental Status: He is alert.     Assessment & Plan:   Problem List Items Addressed This Visit       Respiratory   Asthma    Stable at the present time        Digestive   Pancreatic benign neoplasm    Refer to GI specialist        Nervous and Auditory   Lumbago of lumbar region with sciatica    - Patient's back pain is under control with medication.  - Encouraged the patient to stretch or do yoga as able to help with back pain        Other   Class 3 severe obesity due to excess calories with serious comorbidity and body mass index (BMI) of 40.0  to 44.9 in adult [facility])    - I encouraged the patient to lose weight.  - I educated them on making healthy dietary choices including eating more fruits and vegetables and less fried foods. - I encouraged the patient to exercise more, and educated on the benefits of exercise including weight loss, diabetes prevention, and hypertension prevention.   Dietary counseling with a registered dietician  Referral to a weight management support group (e.g. Weight Watchers, Overeaters Anonymous)  If your BMI is greater than 29 or you have gained more than 15 pounds you should work on weight loss.  Attend a healthy cooking class       Rhinophyma   Screening for colon cancer - Primary    Refer to GI for colonoscopy      Relevant Orders   Ambulatory referral to Gastroenterology    Outpatient Encounter Medications as of 08/07/2022  Medication Sig   cyclobenzaprine (FLEXERIL) 10 MG tablet Take 1 tablet (10 mg total) by mouth 2 (two) times daily as needed for muscle spasms. (Patient not taking: Reported on 08/07/2022)   [DISCONTINUED] doxycycline (VIBRAMYCIN) 100 MG capsule Take 1 capsule (100 mg total) by mouth 2 (two) times daily. (Patient not taking: Reported on 05/09/2022)   [DISCONTINUED] metroNIDAZOLE (METROCREAM) 0.75 % cream Apply topically 2 (two) times daily. (Patient not taking: Reported on 05/09/2022)   No facility-administered encounter medications on file as of 08/07/2022.    Follow-up: No follow-ups on file.   Cletis Athens, MD

## 2022-08-07 NOTE — Assessment & Plan Note (Signed)
Refer to GI for colonoscopy.

## 2022-08-07 NOTE — Assessment & Plan Note (Signed)
Refer to GI specialist 

## 2022-08-07 NOTE — Assessment & Plan Note (Signed)
-   Patient's back pain is under control with medication.  - Encouraged the patient to stretch or do yoga as able to help with back pain 

## 2022-08-07 NOTE — Assessment & Plan Note (Signed)
Stable at the present time. 

## 2022-08-15 ENCOUNTER — Other Ambulatory Visit: Payer: Self-pay

## 2022-08-15 ENCOUNTER — Telehealth: Payer: Self-pay

## 2022-08-15 ENCOUNTER — Encounter: Payer: Self-pay | Admitting: *Deleted

## 2022-08-15 DIAGNOSIS — Z86010 Personal history of colonic polyps: Secondary | ICD-10-CM

## 2022-08-15 DIAGNOSIS — Z800 Family history of malignant neoplasm of digestive organs: Secondary | ICD-10-CM

## 2022-08-15 MED ORDER — NA SULFATE-K SULFATE-MG SULF 17.5-3.13-1.6 GM/177ML PO SOLN: 354.0000 mL | Freq: Once | ORAL | 0 refills | Status: AC

## 2022-08-15 NOTE — Telephone Encounter (Signed)
Gastroenterology Pre-Procedure Review  Request Date: 09/13/2022 Requesting Physician: Dr. Marius Ditch  PATIENT REVIEW QUESTIONS: The patient responded to the following health history questions as indicated:    1. Are you having any GI issues? Off and on abdominal pain but not often  2. Do you have a personal history of Polyps? 12 removed 15 years ago  3. Do you have a family history of Colon Cancer or Polyps? Farther had colon cancer  4. Diabetes Mellitus? no 5. Joint replacements in the past 12 months?no 6. Major health problems in the past 3 months?no 7. Any artificial heart valves, MVP, or defibrillator?no    MEDICATIONS & ALLERGIES:    Patient reports the following regarding taking any anticoagulation/antiplatelet therapy:   Plavix, Coumadin, Eliquis, Xarelto, Lovenox, Pradaxa, Brilinta, or Effient? no Aspirin? no  Patient confirms/reports the following medications:  Current Outpatient Medications  Medication Sig Dispense Refill   cyclobenzaprine (FLEXERIL) 10 MG tablet Take 1 tablet (10 mg total) by mouth 2 (two) times daily as needed for muscle spasms. (Patient not taking: Reported on 08/07/2022) 20 tablet 0   Current Facility-Administered Medications  Medication Dose Route Frequency Provider Last Rate Last Admin   diclofenac Sodium (VOLTAREN) 1 % topical gel 2 g  2 g Topical QID Cletis Athens, MD        Patient confirms/reports the following allergies:  No Known Allergies  No orders of the defined types were placed in this encounter.   AUTHORIZATION INFORMATION Primary Insurance: 1D#: Group #:  Secondary Insurance: 1D#: Group #:  SCHEDULE INFORMATION: Date:  Time: Location:

## 2022-08-20 ENCOUNTER — Ambulatory Visit
Admission: EM | Admit: 2022-08-20 | Discharge: 2022-08-20 | Disposition: A | Discharge status: Home / Self Care | Payer: Self-pay | Attending: Physician Assistant | Admitting: Physician Assistant

## 2022-08-20 DIAGNOSIS — J45901 Unspecified asthma with (acute) exacerbation: Secondary | ICD-10-CM

## 2022-08-20 MED ORDER — PREDNISONE 20 MG PO TABS: 40.0000 mg | ORAL_TABLET | Freq: Every day | ORAL | 0 refills | Status: AC

## 2022-08-20 MED ORDER — ALBUTEROL SULFATE HFA 108 (90 BASE) MCG/ACT IN AERS: 1.0000 | INHALATION_SPRAY | Freq: Four times a day (QID) | RESPIRATORY_TRACT | 0 refills | Status: AC | PRN

## 2022-08-20 MED ORDER — AZITHROMYCIN 250 MG PO TABS: 250.0000 mg | ORAL_TABLET | Freq: Every day | ORAL | 0 refills | Status: DC

## 2022-08-20 NOTE — ED Triage Notes (Signed)
Pt presents with shortness of breath X 1 week associated with his asthma; pt states he needs a new inhaler.

## 2022-08-20 NOTE — ED Provider Notes (Signed)
[name]    CSN: [id number] Arrival date & time: 08/20/22  1034      History   Chief Complaint Chief Complaint  Patient presents with   Asthma    HPI Aaron Clarke is a 66 y.o. male.   Patient here today for evaluation of cough, shortness of breath has had for the last week.  He reports that he does have asthma and needs new prescription for inhaler as he no longer has any albuterol.  He has not had fever.  He does report some nasal drainage and congestion.  He does not report sore throat or ear pain.  The history is provided by the patient.    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   Rosacea 05/20/2021   Cholecystitis with cholelithiasis 12/19/2011   Class 3 severe obesity due to excess calories with serious comorbidity and body mass index (BMI) of 40.0 to 44.9 in adult [facility]) 12/14/2011   Asthma 12/14/2011    Past Surgical History:  Procedure Laterality Date   CHOLECYSTECTOMY  12/19/2011   Procedure: LAPAROSCOPIC CHOLECYSTECTOMY WITH INTRAOPERATIVE CHOLANGIOGRAM;  Surgeon: Adin Hector, MD;  Location: [place];  Service: General;  Laterality: N/A;   Colonscopy     Fatty tumor  2003   L chest       Home Medications    Prior to Admission medications   Medication Sig Start Date End Date Taking? Authorizing Provider  albuterol (VENTOLIN HFA) 108 (90 Base) MCG/ACT inhaler Inhale 1-2 puffs into the lungs every 6 (six) hours as needed for wheezing or shortness of breath. 08/20/22  Yes Francene Finders, PA-C  azithromycin (ZITHROMAX) 250 MG tablet Take 1 tablet (250 mg total) by mouth daily. Take first 2 tablets  together, then 1 every day until finished. 08/20/22  Yes Francene Finders, PA-C  predniSONE (DELTASONE) 20 MG tablet Take 2 tablets (40 mg total) by mouth daily with breakfast for 5 days. 08/20/22 08/25/22 Yes Francene Finders, PA-C  cyclobenzaprine (FLEXERIL) 10 MG tablet Take 1 tablet (10 mg total) by mouth 2 (two) times daily as needed for muscle spasms. Patient not taking: Reported on 08/07/2022 07/04/22   Francene Finders, PA-C    Family History Family History  Problem Relation Age of Onset   Cancer Mother        breast   Cancer Father        colon   Heart disease Father    Asthma Sister    Anesthesia problems Neg Hx     Social History Social History   Tobacco Use   Smoking status: Former    Years: 14.00    Types: Cigarettes    Quit date: 06/19/1988    Years since quitting: 34.1   Smokeless tobacco: Never  Substance Use Topics   Alcohol use: Yes    Comment: 2 -3 per day   Drug use: No     Allergies   Patient has no known allergies.   Review of Systems Review of Systems  Constitutional:  Negative for chills and fever.  HENT:  Positive for congestion.  Negative for ear pain and sore throat.   Eyes:  Negative for discharge and redness.  Respiratory:  Positive for cough and shortness of breath.   Gastrointestinal:  Negative for abdominal pain, diarrhea, nausea and vomiting.     Physical Exam Triage Vital Signs ED Triage Vitals [08/20/22 1128]  Enc Vitals Group     BP (!) 167/104     Pulse Rate 81     Resp 17     Temp 98 F (36.7 C)     Temp Source Oral     SpO2 93 %     Weight      Height      Head Circumference      Peak Flow      Pain Score 0     Pain Loc      Pain Edu?      Excl. in [place]?    No data found.  Updated Vital Signs BP (!) 167/104 (BP Location: Left Arm)   Pulse 81   Temp 98 F (36.7 C) (Oral)   Resp 17   SpO2 93%     Physical Exam Vitals and nursing note reviewed.  Constitutional:      General: He is not in acute distress.     Appearance: Normal appearance. He is not ill-appearing.  HENT:     Head: Normocephalic and atraumatic.     Nose: Nose normal. No congestion.  Eyes:     Conjunctiva/sclera: Conjunctivae normal.  Cardiovascular:     Rate and Rhythm: Normal rate and regular rhythm.     Heart sounds: Normal heart sounds. No murmur heard. Pulmonary:     Effort: Pulmonary effort is normal. No respiratory distress.     Breath sounds: Normal breath sounds. No wheezing, rhonchi or rales.  Skin:    General: Skin is warm and dry.  Neurological:     Mental Status: He is alert.  Psychiatric:        Mood and Affect: Mood normal.        Thought Content: Thought content normal.      UC Treatments / Results  Labs (all labs ordered are listed, but only abnormal results are displayed) Labs Reviewed - No data to display  EKG   Radiology No results found.  Procedures Procedures (including critical care time)  Medications Ordered in UC Medications - No data to display  Initial Impression / Assessment and Plan / UC Course  I have reviewed the triage vital signs and the nursing notes.  Pertinent labs & imaging results that were available during my care of the patient were reviewed by me and considered in my medical decision making (see chart for details).    Steroid burst as well as Z-Pak prescribed for treatment of suspected asthma exacerbation.  Albuterol inhaler refilled.  Encouraged follow-up if no gradual improvement or with any further concerns.  Final Clinical Impressions(s) / UC Diagnoses   Final diagnoses:  Asthma with acute exacerbation, unspecified asthma severity, unspecified whether persistent   Discharge Instructions   None    ED Prescriptions     Medication Sig Dispense Auth. Provider   predniSONE (DELTASONE) 20 MG tablet Take 2 tablets (40 mg total) by mouth daily with breakfast for 5 days. 10 tablet Ewell Poe F, PA-C   azithromycin (ZITHROMAX) 250 MG tablet Take 1 tablet  (250 mg total) by mouth daily. Take first 2 tablets together, then 1 every day until finished. 6 tablet Francene Finders, PA-C  albuterol (VENTOLIN HFA) 108 (90 Base) MCG/ACT inhaler Inhale 1-2 puffs into the lungs every 6 (six) hours as needed for wheezing or shortness of breath. 8 g Francene Finders, PA-C      PDMP not reviewed this encounter.   Francene Finders, PA-C 08/20/22 1239

## 2022-08-28 ENCOUNTER — Ambulatory Visit
Admission: EM | Admit: 2022-08-28 | Discharge: 2022-08-28 | Disposition: A | Discharge status: Home / Self Care | Payer: Self-pay | Attending: Physician Assistant | Admitting: Physician Assistant

## 2022-08-28 DIAGNOSIS — H60393 Other infective otitis externa, bilateral: Secondary | ICD-10-CM

## 2022-08-28 MED ORDER — CORTISPORIN-TC 3.3-3-10-0.5 MG/ML OT SUSP: 3.0000 [drp] | Freq: Four times a day (QID) | OTIC | 0 refills | Status: AC

## 2022-08-28 NOTE — ED Triage Notes (Signed)
Pt c/o bilat otalgia. States was recently on abx and steroids for a resp issue which was helping the ears but is done w/ tx and otalgia has returned.

## 2022-08-28 NOTE — ED Provider Notes (Signed)
[name]    CSN: [id number] Arrival date & time: 08/28/22  1443      History   Chief Complaint Chief Complaint  Patient presents with   Otalgia    HPI Aaron Clarke is a 66 y.o. male.   Patient here today for evaluation of bilateral ear pain.  He reports that he has had drainage from both of his ears at nighttime as well.  He notes he was recently treated with antibiotic and steroids for respiratory issue and states he did have some improvement of ear discomfort but as soon as he finished medication symptoms returned.  He has not had any fever.  The history is provided by the patient.  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   Rosacea 05/20/2021   Cholecystitis with cholelithiasis 12/19/2011   Class 3 severe obesity due to excess calories with serious comorbidity and body mass index (BMI) of 40.0 to 44.9 in adult [facility]) 12/14/2011   Asthma 12/14/2011    Past Surgical History:  Procedure Laterality Date   CHOLECYSTECTOMY  12/19/2011   Procedure: LAPAROSCOPIC CHOLECYSTECTOMY WITH INTRAOPERATIVE CHOLANGIOGRAM;  Surgeon: Adin Hector, MD;  Location: [place];  Service: General;  Laterality: N/A;   Colonscopy     Fatty tumor  2003   L chest       Home Medications    Prior to Admission medications   Medication Sig Start Date End Date Taking? Authorizing Provider  neomycin-colistin-hydrocortisone-thonzonium (CORTISPORIN-TC) 3.01-20-09-0.5 MG/ML OTIC suspension Place 3 drops into both ears 4 (four) times daily for 7 days.  08/28/22 09/04/22 Yes Francene Finders, PA-C  albuterol (VENTOLIN HFA) 108 (90 Base) MCG/ACT inhaler Inhale 1-2 puffs into the lungs every 6 (six) hours as needed for wheezing or shortness of breath. 08/20/22   Francene Finders, PA-C  azithromycin (ZITHROMAX) 250 MG tablet Take 1 tablet (250 mg total) by mouth daily. Take first 2 tablets together, then 1 every day until finished. 08/20/22   Francene Finders, PA-C  cyclobenzaprine (FLEXERIL) 10 MG tablet Take 1 tablet (10 mg total) by mouth 2 (two) times daily as needed for muscle spasms. Patient not taking: Reported on 08/07/2022 07/04/22   Francene Finders, PA-C    Family History Family History  Problem Relation Age of Onset   Cancer Mother        breast   Cancer Father        colon   Heart disease Father    Asthma Sister    Anesthesia problems Neg Hx     Social History Social History   Tobacco Use   Smoking status: Former    Years: 14.00    Types: Cigarettes    Quit date: 06/19/1988    Years since quitting: 34.2   Smokeless tobacco: Never  Substance Use Topics   Alcohol use: Yes    Comment: 2 -3 per day   Drug use: No     Allergies   Patient has no known allergies.  Review of Systems Review of Systems  Constitutional:  Negative for chills and fever.  HENT:  Positive for ear discharge and ear pain. Negative for rhinorrhea.   Eyes:  Negative for discharge and redness.  Respiratory:  Negative for shortness of breath.      Physical Exam Triage Vital Signs ED Triage Vitals  Enc Vitals Group     BP 08/28/22 1540 (!) 149/86     Pulse Rate 08/28/22 1540 93     Resp 08/28/22 1540 16     Temp 08/28/22 1540 98 F (36.7 C)     Temp Source 08/28/22 1540 Oral     SpO2 08/28/22 1540 96 %     Weight --      Height --      Head Circumference --      Peak Flow --      Pain Score 08/28/22 1541 4     Pain Loc --      Pain Edu? --      Excl. in [place]? --    No data found.  Updated Vital Signs BP (!) 149/86 (BP Location:  Right Arm)   Pulse 93   Temp 98 F (36.7 C) (Oral)   Resp 16   SpO2 96%      Physical Exam Vitals and nursing note reviewed.  Constitutional:      General: He is not in acute distress.    Appearance: Normal appearance. He is not ill-appearing.  HENT:     Head: Normocephalic and atraumatic.     Ears:     Comments: Bilateral EACs erythematous with white exudate noted to left ear    Nose: Nose normal. No congestion or rhinorrhea.  Eyes:     Conjunctiva/sclera: Conjunctivae normal.  Cardiovascular:     Rate and Rhythm: Normal rate.  Pulmonary:     Effort: Pulmonary effort is normal.  Neurological:     Mental Status: He is alert.  Psychiatric:        Mood and Affect: Mood normal.        Behavior: Behavior normal.        Thought Content: Thought content normal.      UC Treatments / Results  Labs (all labs ordered are listed, but only abnormal results are displayed) Labs Reviewed - No data to display  EKG   Radiology No results found.  Procedures Procedures (including critical care time)  Medications Ordered in UC Medications - No data to display  Initial Impression / Assessment and Plan / UC Course  I have reviewed the triage vital signs and the nursing notes.  Pertinent labs & imaging results that were available during my care of the patient were reviewed by me and considered in my medical decision making (see chart for details).    We will treat to cover otitis externa.  Cortisporin drops prescribed in hopes to improve symptoms.  Recommended follow-up if no gradual improvement or with any further concerns.  Final Clinical Impressions(s) / UC Diagnoses   Final diagnoses:  Acute infective otitis externa, bilateral   Discharge Instructions   None    ED Prescriptions     Medication Sig Dispense Auth. Provider   neomycin-colistin-hydrocortisone-thonzonium (CORTISPORIN-TC) 3.01-20-09-0.5 MG/ML OTIC suspension Place 3 drops into both ears 4 (four) times daily  for 7 days. 10 mL Francene Finders, PA-C      PDMP not reviewed this encounter.   Francene Finders, PA-C 08/28/22 Johnnye Lana

## 2022-09-04 ENCOUNTER — Encounter: Payer: Self-pay | Admitting: Internal Medicine

## 2022-09-04 ENCOUNTER — Ambulatory Visit (INDEPENDENT_AMBULATORY_CARE_PROVIDER_SITE_OTHER): Payer: Self-pay | Admitting: Internal Medicine

## 2022-09-04 VITALS — BP 147/84 | HR 94 | Ht 72.0 in | Wt 291.5 lb

## 2022-09-04 DIAGNOSIS — M1710 Unilateral primary osteoarthritis, unspecified knee: Secondary | ICD-10-CM

## 2022-09-04 DIAGNOSIS — M5440 Lumbago with sciatica, unspecified side: Secondary | ICD-10-CM

## 2022-09-04 DIAGNOSIS — L711 Rhinophyma: Secondary | ICD-10-CM

## 2022-09-04 DIAGNOSIS — H60313 Diffuse otitis externa, bilateral: Secondary | ICD-10-CM | POA: Insufficient documentation

## 2022-09-04 DIAGNOSIS — Z6841 Body Mass Index (BMI) 40.0 and over, adult: Secondary | ICD-10-CM

## 2022-09-04 MED ORDER — OFLOXACIN 0.3 % OT SOLN: 5.0000 [drp] | Freq: Every day | OTIC | Status: DC

## 2022-09-04 MED ORDER — MELOXICAM 7.5 MG PO TABS: 7.5000 mg | ORAL_TABLET | Freq: Every day | ORAL | 0 refills | Status: DC

## 2022-09-04 NOTE — Assessment & Plan Note (Signed)

## 2022-09-04 NOTE — Assessment & Plan Note (Signed)
Patient was advised to go to ofloxacin in both ears.

## 2022-09-04 NOTE — Progress Notes (Signed)
Established Patient Office Visit  Subjective:  Patient ID: Aaron Clarke, male    DOB: [birth year]  Age: 66 y.o. MRN: [id number]  CC:  Chief Complaint  Patient presents with   Follow-up    HPI  Aaron Clarke presents for pain lt ear pain lt knee  Past Medical History:  Diagnosis Date   Asthma    Cholelithiases    Chronic kidney disease    Kidney stone 04/2011   Family history of breast cancer    mother    Past Surgical History:  Procedure Laterality Date   CHOLECYSTECTOMY  12/19/2011   Procedure: LAPAROSCOPIC CHOLECYSTECTOMY WITH INTRAOPERATIVE CHOLANGIOGRAM;  Surgeon: Adin Hector, MD;  Location: [place];  Service: General;  Laterality: N/A;   Colonscopy     Fatty tumor  2003   L chest    Family History  Problem Relation Age of Onset   Cancer Mother        breast   Cancer Father        colon   Heart disease Father    Asthma Sister    Anesthesia problems Neg Hx     Social History   Socioeconomic History   Marital status: Significant Other    Spouse name: Not on file   Number of children: Not on file   Years of education: Not on file   Highest education level: Not on file  Occupational History   Not on file  Tobacco Use   Smoking status: Former    Years: 14.00    Types: Cigarettes    Quit date: 06/19/1988    Years since quitting: 34.2   Smokeless tobacco: Never  Substance and Sexual Activity   Alcohol use: Yes    Comment: 2 -3 per day   Drug use: No   Sexual activity: Not on file  Other Topics Concern   Not on file  Social History Narrative   Not on file   Social Determinants of Health   Financial Resource Strain: Not on file  Food Insecurity: Not on file  Transportation Needs: Not on file  Physical Activity: Not on file  Stress: Not on file  Social Connections: Not on file  Intimate Partner Violence: Not on file     Current Outpatient Medications:    albuterol (VENTOLIN HFA) 108 (90 Base) MCG/ACT inhaler, Inhale 1-2 puffs  into the lungs every 6 (six) hours as needed for wheezing or shortness of breath., Disp: 8 g, Rfl: 0   azithromycin (ZITHROMAX) 250 MG tablet, Take 1 tablet (250 mg total) by mouth daily. Take first 2 tablets together, then 1 every day until finished., Disp: 6 tablet, Rfl: 0   cyclobenzaprine (FLEXERIL) 10 MG tablet, Take 1 tablet (10 mg total) by mouth 2 (two) times daily as needed for muscle spasms. (Patient not taking: Reported on 08/07/2022), Disp: 20 tablet, Rfl: 0   neomycin-colistin-hydrocortisone-thonzonium (CORTISPORIN-TC) 3.01-20-09-0.5 MG/ML OTIC suspension, Place 3 drops into both ears 4 (four) times daily for 7 days., Disp: 10 mL, Rfl: 0  Current Facility-Administered Medications:    diclofenac Sodium (VOLTAREN) 1 % topical gel 2 g, 2 g, Topical, QID, August Gosser, MD   No Known Allergies  ROS Review of Systems  Constitutional: Negative.   HENT: Negative.    Eyes: Negative.   Respiratory: Negative.    Cardiovascular: Negative.   Gastrointestinal: Negative.   Endocrine: Negative.   Genitourinary: Negative.   Musculoskeletal: Negative.   Skin: Negative.   Allergic/Immunologic: Negative.   Neurological:  Negative.   Hematological: Negative.   Psychiatric/Behavioral: Negative.    All other systems reviewed and are negative.     Objective:    Physical Exam Vitals reviewed.  Constitutional:      Appearance: Normal appearance.  HENT:     Mouth/Throat:     Mouth: Mucous membranes are moist.  Eyes:     Pupils: Pupils are equal, round, and reactive to light.  Neck:     Vascular: No carotid bruit.  Cardiovascular:     Rate and Rhythm: Normal rate and regular rhythm.     Pulses: Normal pulses.     Heart sounds: Normal heart sounds.  Pulmonary:     Effort: Pulmonary effort is normal.     Breath sounds: Normal breath sounds.  Abdominal:     General: Bowel sounds are normal.     Palpations: Abdomen is soft. There is no hepatomegaly, splenomegaly or mass.      Tenderness: There is no abdominal tenderness.     Hernia: No hernia is present.  Musculoskeletal:     Cervical back: Neck supple.     Right lower leg: No edema.     Left lower leg: No edema.  Skin:    Findings: No rash.  Neurological:     Mental Status: He is alert and oriented to person, place, and time.     Motor: No weakness.  Psychiatric:        Mood and Affect: Mood normal.        Behavior: Behavior normal.     BP (!) 147/84   Pulse 94   Ht 6' (1.829 m)   Wt 291 lb 8 oz (132.2 kg)   BMI 39.53 kg/m  Wt Readings from Last 3 Encounters:  09/04/22 291 lb 8 oz (132.2 kg)  08/07/22 295 lb 12.8 oz (134.2 kg)  05/20/21 282 lb 9.6 oz (128.2 kg)     Health Maintenance Due  Topic Date Due   COVID-19 Vaccine (1) Never done   TETANUS/TDAP  Never done   COLONOSCOPY (Pts 45-46yr Insurance coverage will need to be confirmed)  Never done    There are no preventive care reminders to display for this patient.  No results found for: "TSH" Lab Results  Component Value Date   WBC 8.6 07/06/2017   HGB 15.6 07/06/2017   HCT 47.1 07/06/2017   MCV 87.9 07/06/2017   PLT 186 07/06/2017   Lab Results  Component Value Date   NA 138 07/06/2017   K 3.8 07/06/2017   CO2 24 07/06/2017   GLUCOSE 139 (H) 07/06/2017   BUN 14 07/06/2017   CREATININE 1.32 (H) 07/06/2017   BILITOT 0.5 04/29/2012   ALKPHOS 60 04/29/2012   AST 20 04/29/2012   ALT 35 04/29/2012   PROT 7.5 04/29/2012   ALBUMIN 3.8 04/29/2012   CALCIUM 9.1 07/06/2017   ANIONGAP 10 07/06/2017   No results found for: "CHOL" No results found for: "HDL" No results found for: "LDLCALC" No results found for: "TRIG" No results found for: "CHOLHDL" No results found for: "HGBA1C"    Assessment & Plan:   Problem List Items Addressed This Visit       Nervous and Auditory   Lumbago of lumbar region with sciatica   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         Musculoskeletal and Integument   Knee arthropathy    Suggest knee  brace and Mobic 7.5 mg p.o. daily.  Refer to orthopedics        Other   Class 3 severe obesity due to excess calories with serious comorbidity and body mass index (BMI) of 40.0 to 44.9 in adult [facility]) - Primary    - I encouraged the patient to lose weight.  - I educated them on making healthy dietary choices including eating more fruits and vegetables and less fried foods. - I encouraged the patient to exercise more, and educated on the benefits of exercise including weight loss, diabetes prevention, and hypertension prevention.   Dietary counseling with a registered dietician  Referral to a weight management support group (e.g. Weight Watchers, Overeaters Anonymous)  If your BMI is greater than 29 or you have gained more than 15 pounds you should work on weight loss.  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  - I educated them on making healthy dietary choices including eating more fruits and vegetables and less fried foods. - I encouraged the patient to exercise more, and educated on the benefits of exercise including weight loss, diabetes prevention, and hypertension prevention.   Dietary counseling with a registered dietician  Referral to a weight management support group (e.g. Weight Watchers, Overeaters Anonymous)  If your BMI is greater than 29 or you have gained more than 15 pounds you should work on weight loss.  Attend a healthy cooking class    Chronic diffuse otitis externa of both ears Assessment & Plan: Patient was advised to go to ofloxacin in both ears.   Knee arthropathy Assessment & Plan: Suggest knee brace and Mobic 7.5 mg p.o. daily.  Refer to  orthopedics   Lumbago of lumbar region with sciatica  Rhinophyma Assessment & Plan: Patient is getting laser treatment     No orders of the defined types were placed in this encounter.   Follow-up: No follow-ups on file.    Cletis Athens, MD

## 2022-09-04 NOTE — Assessment & Plan Note (Signed)
Suggest knee brace and Mobic 7.5 mg p.o. daily.  Refer to orthopedics

## 2022-09-04 NOTE — Assessment & Plan Note (Signed)
Patient is getting laser treatment

## 2022-09-13 ENCOUNTER — Ambulatory Visit: Payer: Self-pay | Admitting: Certified Registered Nurse Anesthetist

## 2022-09-13 ENCOUNTER — Encounter: Payer: Self-pay | Admitting: Gastroenterology

## 2022-09-13 ENCOUNTER — Ambulatory Visit
Admission: RE | Admit: 2022-09-13 | Discharge: 2022-09-13 | Disposition: A | Discharge status: Home / Self Care | Payer: Self-pay | Attending: Gastroenterology | Admitting: Gastroenterology

## 2022-09-13 ENCOUNTER — Encounter
Admission: RE | Disposition: A | Discharge status: Home / Self Care | Payer: Self-pay | Source: Home / Self Care | Attending: Gastroenterology

## 2022-09-13 DIAGNOSIS — Z87891 Personal history of nicotine dependence: Secondary | ICD-10-CM | POA: Insufficient documentation

## 2022-09-13 DIAGNOSIS — D122 Benign neoplasm of ascending colon: Secondary | ICD-10-CM | POA: Insufficient documentation

## 2022-09-13 DIAGNOSIS — Z1211 Encounter for screening for malignant neoplasm of colon: Secondary | ICD-10-CM | POA: Insufficient documentation

## 2022-09-13 DIAGNOSIS — Z800 Family history of malignant neoplasm of digestive organs: Secondary | ICD-10-CM

## 2022-09-13 DIAGNOSIS — J45909 Unspecified asthma, uncomplicated: Secondary | ICD-10-CM | POA: Insufficient documentation

## 2022-09-13 DIAGNOSIS — Z6841 Body Mass Index (BMI) 40.0 and over, adult: Secondary | ICD-10-CM | POA: Insufficient documentation

## 2022-09-13 DIAGNOSIS — Z860100 Personal history of colon polyps, unspecified: Secondary | ICD-10-CM

## 2022-09-13 DIAGNOSIS — D120 Benign neoplasm of cecum: Secondary | ICD-10-CM | POA: Insufficient documentation

## 2022-09-13 DIAGNOSIS — K5730 Diverticulosis of large intestine without perforation or abscess without bleeding: Secondary | ICD-10-CM | POA: Insufficient documentation

## 2022-09-13 DIAGNOSIS — K635 Polyp of colon: Secondary | ICD-10-CM

## 2022-09-13 HISTORY — PX: COLONOSCOPY WITH PROPOFOL: SHX5780

## 2022-09-13 SURGERY — COLONOSCOPY WITH PROPOFOL
Anesthesia: General

## 2022-09-13 MED ORDER — PROPOFOL 10 MG/ML IV BOLUS
INTRAVENOUS | Status: DC | PRN
  Administered 2022-09-13: 40 mg via INTRAVENOUS

## 2022-09-13 MED ORDER — PROPOFOL 10 MG/ML IV BOLUS
INTRAVENOUS | Status: AC
  Filled 2022-09-13: qty 20

## 2022-09-13 MED ORDER — LIDOCAINE HCL (CARDIAC) PF 100 MG/5ML IV SOSY
PREFILLED_SYRINGE | INTRAVENOUS | Status: DC | PRN
  Administered 2022-09-13: 50 mg via INTRAVENOUS

## 2022-09-13 MED ORDER — SODIUM CHLORIDE 0.9 % IV SOLN: INTRAVENOUS | Status: DC

## 2022-09-13 MED ORDER — PROPOFOL 500 MG/50ML IV EMUL
INTRAVENOUS | Status: DC | PRN
  Administered 2022-09-13: 125 ug/kg/min via INTRAVENOUS

## 2022-09-13 MED ORDER — PROPOFOL 1000 MG/100ML IV EMUL
INTRAVENOUS | Status: AC
  Filled 2022-09-13: qty 100

## 2022-09-13 MED ORDER — PHENYLEPHRINE HCL-NACL 20-0.9 MG/250ML-% IV SOLN
INTRAVENOUS | Status: AC
  Filled 2022-09-13: qty 250

## 2022-09-13 NOTE — Op Note (Signed)
[facility] Gastroenterology Patient Name: Aaron Clarke Procedure Date: 09/13/2022 7:29 AM MRN: [id number] Account #: [id number] Date of Birth: [birth year] Admit Type: Outpatient Age: 66 Room: [facility] ENDO ROOM 4 Gender: Male Note Status: Finalized Instrument Name: Jasper Riling [id number] Procedure:             Colonoscopy Indications:           Surveillance: Personal history of adenomatous polyps                         on last colonoscopy > 5 years ago Providers:             Lin Landsman MD, MD Referring MD:          Cletis Athens, MD (Referring MD) Medicines:             General Anesthesia Complications:         No immediate complications. Estimated blood loss: None. Procedure:             Pre-Anesthesia Assessment:                        - Prior to the procedure, a History and Physical was                         performed, and patient medications and allergies were                         reviewed. The patient is competent. The risks and                         benefits of the procedure and the sedation options and                         risks were discussed with the patient. All questions                         were answered and informed consent was obtained.                         Patient identification and proposed procedure were                         verified by the physician, the nurse, the                         anesthesiologist, the anesthetist and the technician                         in the pre-procedure area in the procedure room in the                         endoscopy suite. Mental Status Examination: alert and                         oriented. Airway Examination: normal oropharyngeal                         airway and neck mobility. Respiratory Examination:  clear to auscultation. CV Examination: normal.                         Prophylactic Antibiotics: The patient does not require                          prophylactic antibiotics. Prior Anticoagulants: The                         patient has taken no anticoagulant or antiplatelet                         agents. ASA Grade Assessment: III - A patient with                         severe systemic disease. After reviewing the risks and                         benefits, the patient was deemed in satisfactory                         condition to undergo the procedure. The anesthesia                         plan was to use general anesthesia. Immediately prior                         to administration of medications, the patient was                         re-assessed for adequacy to receive sedatives. The                         heart rate, respiratory rate, oxygen saturations,                         blood pressure, adequacy of pulmonary ventilation, and                         response to care were monitored throughout the                         procedure. The physical status of the patient was                         re-assessed after the procedure.                        After obtaining informed consent, the colonoscope was                         passed under direct vision. Throughout the procedure,                         the patient's blood pressure, pulse, and oxygen                         saturations were monitored continuously. The  Colonoscope was introduced through the anus and                         advanced to the the cecum, identified by appendiceal                         orifice and ileocecal valve. The colonoscopy was                         performed without difficulty. The patient tolerated                         the procedure well. The quality of the bowel                         preparation was evaluated using the BBPS [facility] Bowel                         Preparation Scale) with scores of: Right Colon = 3,                         Transverse Colon = 3 and Left Colon = 3 (entire mucosa                          seen well with no residual staining, small fragments                         of stool or opaque liquid). The total BBPS score                         equals 9. Findings:      The perianal and digital rectal examinations were normal. Pertinent       negatives include normal sphincter tone and no palpable rectal lesions.      Two sessile polyps were found in the cecum. The polyps were 2 to 3 mm in       size. These polyps were removed with a jumbo cold forceps. Resection and       retrieval were complete.      Four sessile polyps were found in the ascending colon. The polyps were 4       to 5 mm in size. These polyps were removed with a cold snare. Resection       and retrieval were complete.      A diminutive polyp was found in the ascending colon. The polyp was       sessile. The polyp was removed with a jumbo cold forceps. Resection and       retrieval were complete. Estimated blood loss: none.      Multiple diverticula were found in the entire colon.      The retroflexed view of the distal rectum and anal verge was normal and       showed no anal or rectal abnormalities. Impression:            - Two 2 to 3 mm polyps in the cecum, removed with a                         jumbo cold forceps. Resected and retrieved.                        -  Four 4 to 5 mm polyps in the ascending colon,                         removed with a cold snare. Resected and retrieved.                        - One diminutive polyp in the ascending colon, removed                         with a jumbo cold forceps. Resected and retrieved.                        - Diverticulosis in the entire examined colon.                        - The distal rectum and anal verge are normal on                         retroflexion view. Recommendation:        - Discharge patient to home (with escort).                        - Resume previous diet today.                        - Continue present medications.                         - Await pathology results.                        - Repeat colonoscopy in 3 years for surveillance of                         multiple polyps. Procedure Code(s):     --- Professional ---                        [phone], Colonoscopy, flexible; with removal of                         tumor(s), polyp(s), or other lesion(s) by snare                         technique                        45380, 52, Colonoscopy, flexible; with biopsy, single                         or multiple Diagnosis Code(s):     --- Professional ---                        Z86.010, Personal history of colonic polyps                        D12.0, Benign neoplasm of cecum                        K57.30, Diverticulosis of large intestine without  perforation or abscess without bleeding                        D12.2, Benign neoplasm of ascending colon CPT copyright 2022 American Medical Association. All rights reserved. The codes documented in this report are preliminary and upon coder review may  be revised to meet current compliance requirements. Dr. Ulyess Mort Lin Landsman MD, MD 09/13/2022 8:20:16 AM This report has been signed electronically. Number of Addenda: 0 Note Initiated On: 09/13/2022 7:29 AM Scope Withdrawal Time: 0 hours 19 minutes 35 seconds  Total Procedure Duration: 0 hours 23 minutes 20 seconds  Estimated Blood Loss:  Estimated blood loss: none.      [facility]

## 2022-09-13 NOTE — Anesthesia Postprocedure Evaluation (Signed)
Anesthesia Post Note  Patient: Aaron Clarke  Procedure(s) Performed: COLONOSCOPY WITH PROPOFOL  Patient location during evaluation: Endoscopy Anesthesia Type: General Level of consciousness: awake and alert Pain management: pain level controlled Vital Signs Assessment: post-procedure vital signs reviewed and stable Respiratory status: spontaneous breathing, nonlabored ventilation, respiratory function stable and patient connected to nasal cannula oxygen Cardiovascular status: blood pressure returned to baseline and stable Postop Assessment: no apparent nausea or vomiting Anesthetic complications: no   No notable events documented.   Last Vitals:  Vitals:   09/13/22 0830 09/13/22 0840  BP: (!) 143/94 (!) 156/86  Pulse: 89 75  Resp: 18 18  Temp:    SpO2: 98% 97%    Last Pain:  Vitals:   09/13/22 0840  TempSrc:   PainSc: 0-No pain                 Dimas Millin

## 2022-09-13 NOTE — H&P (Signed)
Cephas Darby, MD [address]  [place]  [place], [place] [address]  Main: [phone]  Fax: [phone] Pager: [phone]  Primary Care Physician:  Cletis Athens, MD Primary Gastroenterologist:  Dr. Cephas Darby  Pre-Procedure History & Physical: HPI:  Aaron Clarke is a 66 y.o. male is here for an colonoscopy.   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 Authorizing Provider  albuterol (VENTOLIN HFA) 108 (90 Base) MCG/ACT inhaler Inhale 1-2 puffs into the lungs every 6 (six) hours as needed for wheezing or shortness of breath. 08/20/22   Francene Finders, PA-C  azithromycin (ZITHROMAX) 250 MG tablet Take 1 tablet (250 mg total) by mouth daily. Take first 2 tablets together, then 1 every day until finished. 08/20/22   Francene Finders, PA-C  cyclobenzaprine (FLEXERIL) 10 MG tablet Take 1 tablet (10 mg total) by mouth 2 (two) times daily as needed for muscle spasms. Patient not taking: Reported on 08/07/2022 07/04/22   Francene Finders, PA-C  meloxicam (MOBIC) 7.5 MG tablet Take 1 tablet (7.5 mg total) by mouth daily. 09/04/22   Cletis Athens, MD    Allergies as of 08/15/2022   (No Known Allergies)    Family History  Problem Relation Age of Onset   Cancer Mother        breast   Cancer Father        colon   Heart disease Father    Asthma Sister    Anesthesia problems Neg Hx     Social History   Socioeconomic History   Marital status: Significant Other    Spouse  name: Not on file   Number of children: Not on file   Years of education: Not on file   Highest education level: Not on file  Occupational History   Not on file  Tobacco Use   Smoking status: Former    Years: 14.00    Types: Cigarettes    Quit date: 06/19/1988    Years since quitting: 34.2   Smokeless tobacco: Never  Substance and Sexual Activity   Alcohol use: Yes    Comment: 2 -3 per day   Drug use: No   Sexual activity: Not on file  Other Topics Concern   Not on file  Social History Narrative   Not on file   Social Determinants of Health   Financial Resource Strain: Not on file  Food Insecurity: Not on file  Transportation Needs: Not on file  Physical Activity: Not on file  Stress: Not on file  Social Connections: Not on file  Intimate Partner Violence: Not on file    Review of Systems: See HPI, otherwise negative ROS  Physical Exam: BP (!) 148/104   Pulse 90   Temp (!) 97.2 F (36.2 C) (Temporal)   Resp 17   Ht '5\' 9"'$  (1.753 m)   Wt 124.7 kg   SpO2 95%   BMI 40.61  kg/m  General:   Alert,  pleasant and cooperative in NAD Head:  Normocephalic and atraumatic. Neck:  Supple; no masses or thyromegaly. Lungs:  Clear throughout to auscultation.    Heart:  Regular rate and rhythm. Abdomen:  Soft, nontender and nondistended. Normal bowel sounds, without guarding, and without rebound.   Neurologic:  Alert and  oriented x4;  grossly normal neurologically.  Impression/Plan: Aaron Clarke is here for an colonoscopy to be performed for h/o colon polyps  Risks, benefits, limitations, and alternatives regarding  colonoscopy have been reviewed with the patient.  Questions have been answered.  All parties agreeable.   Sherri Sear, MD  09/13/2022, 7:41 AM

## 2022-09-13 NOTE — Anesthesia Preprocedure Evaluation (Signed)
Anesthesia Evaluation  Patient identified by MRN, date of birth, ID band Patient awake    Reviewed: Allergy & Precautions, H&P , NPO status , Patient's Chart, lab work & pertinent test results  History of Anesthesia Complications Negative for: history of anesthetic complications  Airway Mallampati: III  TM Distance: >3 FB Neck ROM: Full    Dental  (+) Poor Dentition, Edentulous Upper   Pulmonary asthma , former smoker,    breath sounds clear to auscultation       Cardiovascular (-) angina(-) Past MI and (-) CABG negative cardio ROS   Rhythm:Regular Rate:Normal     Neuro/Psych    GI/Hepatic negative GI ROS, Neg liver ROS,   Endo/Other  negative endocrine ROSMorbid obesity  Renal/GU negative Renal ROS     Musculoskeletal   Abdominal   Peds  Hematology negative hematology ROS (+)   Anesthesia Other Findings   Reproductive/Obstetrics                             Anesthesia Physical  Anesthesia Plan  ASA: III  Anesthesia Plan: General   Post-op Pain Management: Minimal or no pain anticipated   Induction: Intravenous  PONV Risk Score and Plan: 3 and Propofol infusion, TIVA and Ondansetron  Airway Management Planned: Nasal Cannula and Natural Airway  Additional Equipment: None  Intra-op Plan:   Post-operative Plan: Extubation in OR  Informed Consent: I have reviewed the patients History and Physical, chart, labs and discussed the procedure including the risks, benefits and alternatives for the proposed anesthesia with the patient or authorized representative who has indicated his/her understanding and acceptance.     Dental advisory given  Plan Discussed with: CRNA and Anesthesiologist  Anesthesia Plan Comments: (Discussed risks of anesthesia with patient, including possibility of difficulty with spontaneous ventilation under anesthesia necessitating airway intervention, PONV,  and rare risks such as cardiac or respiratory or neurological events, and allergic reactions. Discussed the role of CRNA in patient's perioperative care. Patient understands.)        Anesthesia Quick Evaluation

## 2022-09-13 NOTE — Transfer of Care (Signed)
Immediate Anesthesia Transfer of Care Note  Patient: Aaron Clarke  Procedure(s) Performed: COLONOSCOPY WITH PROPOFOL  Patient Location: PACU  Anesthesia Type:General  Level of Consciousness: awake, alert  and oriented  Airway & Oxygen Therapy: Patient Spontanous Breathing and Patient connected to nasal cannula oxygen  Post-op Assessment: Report given to RN and Post -op Vital signs reviewed and stable  Post vital signs: Reviewed and stable  Last Vitals:  Vitals Value Taken Time  BP    Temp    Pulse    Resp    SpO2      Last Pain:  Vitals:   09/13/22 0714  TempSrc: Temporal  PainSc: 2          Complications: No notable events documented.

## 2022-09-14 ENCOUNTER — Encounter: Payer: Self-pay | Admitting: Gastroenterology

## 2022-09-14 LAB — SURGICAL PATHOLOGY

## 2022-09-20 ENCOUNTER — Encounter: Payer: Self-pay | Admitting: Gastroenterology

## 2022-09-27 ENCOUNTER — Ambulatory Visit: Payer: Self-pay | Admitting: Internal Medicine

## 2022-09-28 ENCOUNTER — Encounter: Payer: Self-pay | Admitting: Nurse Practitioner

## 2022-09-28 ENCOUNTER — Ambulatory Visit (INDEPENDENT_AMBULATORY_CARE_PROVIDER_SITE_OTHER): Payer: Self-pay | Admitting: Nurse Practitioner

## 2022-09-28 VITALS — BP 157/91 | HR 86 | Temp 97.9°F

## 2022-09-28 DIAGNOSIS — H60313 Diffuse otitis externa, bilateral: Secondary | ICD-10-CM

## 2022-09-28 DIAGNOSIS — I10 Essential (primary) hypertension: Secondary | ICD-10-CM

## 2022-09-28 DIAGNOSIS — J4531 Mild persistent asthma with (acute) exacerbation: Secondary | ICD-10-CM

## 2022-09-28 MED ORDER — LOSARTAN POTASSIUM 50 MG PO TABS: 50.0000 mg | ORAL_TABLET | Freq: Every day | ORAL | 1 refills | Status: DC

## 2022-09-28 MED ORDER — AMOXICILLIN 500 MG PO CAPS: 500.0000 mg | ORAL_CAPSULE | Freq: Two times a day (BID) | ORAL | 0 refills | Status: AC

## 2022-09-28 NOTE — Progress Notes (Signed)
Established Patient Office Visit  Subjective:  Patient ID: Aaron Clarke, male    DOB: 1955/12/06  Age: 66 y.o. MRN: [id number]  CC:  Chief Complaint  Patient presents with   Follow-up     HPI  Aaron Clarke presents for follow on ear discharge. He states that the ear drops that was prescribed during the last visit was not covered by the insurance and he had some left over antibiotic and steroid for respiratory issues, he took the leftover medication and that helped with the symptoms.   The problem is going on from almost a month. He has been to the urgent care for the same issues and was not able to get the ear drop prescribed during the visit because of insurance issues/ out of stock.   His BP is elevated in the office today. He sometimes check BP at home and the systolic numbers are above 140  and do not remember the dystolic number. He do not take any medication for blood pressure.  HPI   Past Medical History:  Diagnosis Date   Asthma    Cholelithiases    Chronic kidney disease    Kidney stone 04/2011   Family history of breast cancer    mother    Past Surgical History:  Procedure Laterality Date   CHOLECYSTECTOMY  12/19/2011   Procedure: LAPAROSCOPIC CHOLECYSTECTOMY WITH INTRAOPERATIVE CHOLANGIOGRAM;  Surgeon: Adin Hector, MD;  Location: [place];  Service: General;  Laterality: N/A;   COLONOSCOPY WITH PROPOFOL N/A 09/13/2022   Procedure: COLONOSCOPY WITH PROPOFOL;  Surgeon: Lin Landsman, MD;  Location: [facility] ENDOSCOPY;  Service: Gastroenterology;  Laterality: N/A;   Colonscopy     Fatty tumor  2003   L chest    Family History  Problem Relation Age of Onset   Cancer Mother        breast   Cancer Father        colon   Heart disease Father    Asthma Sister    Anesthesia problems Neg Hx     Social History   Socioeconomic History   Marital status: Significant Other    Spouse name: Not on file   Number of children: Not on file   Years of  education: Not on file   Highest education level: Not on file  Occupational History   Not on file  Tobacco Use   Smoking status: Former    Years: 14.00    Types: Cigarettes    Quit date: 06/19/1988    Years since quitting: 34.3   Smokeless tobacco: Never  Substance and Sexual Activity   Alcohol use: Yes    Comment: 2 -3 per day   Drug use: No   Sexual activity: Not on file  Other Topics Concern   Not on file  Social History Narrative   Not on file   Social Determinants of Health   Financial Resource Strain: Not on file  Food Insecurity: Not on file  Transportation Needs: Not on file  Physical Activity: Not on file  Stress: Not on file  Social Connections: Not on file  Intimate Partner Violence: Not on file     Outpatient Medications Prior to Visit  Medication Sig Dispense Refill   albuterol (VENTOLIN HFA) 108 (90 Base) MCG/ACT inhaler Inhale 1-2 puffs into the lungs every 6 (six) hours as needed for wheezing or shortness of breath. 8 g 0   meloxicam (MOBIC) 7.5 MG tablet Take 1 tablet (7.5 mg total) by mouth  daily. (Patient not taking: Reported on 09/28/2022) 30 tablet 0   Facility-Administered Medications Prior to Visit  Medication Dose Route Frequency Provider Last Rate Last Admin   diclofenac Sodium (VOLTAREN) 1 % topical gel 2 g  2 g Topical QID Masoud, Viann Shove, MD       ofloxacin (FLOXIN) 0.3 % OTIC (EAR) solution 5 drop  5 drop Left EAR Daily Masoud, Viann Shove, MD        No Known Allergies  ROS Review of Systems  Constitutional: Negative.   HENT:  Positive for ear discharge.   Eyes: Negative.   Respiratory:  Negative for chest tightness and shortness of breath.   Cardiovascular:  Negative for leg swelling.  Gastrointestinal: Negative.   Genitourinary: Negative.   Musculoskeletal:  Positive for arthralgias.  Skin: Negative.   Neurological: Negative.   Psychiatric/Behavioral: Negative.        Objective:    Physical Exam Constitutional:      Appearance:  Normal appearance. He is obese.  HENT:     Right Ear: Drainage present.     Left Ear: Drainage present.     Nose: Nose normal.     Mouth/Throat:     Mouth: Mucous membranes are moist.  Eyes:     Extraocular Movements: Extraocular movements intact.     Conjunctiva/sclera: Conjunctivae normal.     Pupils: Pupils are equal, round, and reactive to light.  Cardiovascular:     Rate and Rhythm: Normal rate and regular rhythm.     Pulses: Normal pulses.     Heart sounds: Normal heart sounds.  Pulmonary:     Effort: Pulmonary effort is normal.     Breath sounds: Normal breath sounds.  Abdominal:     General: Bowel sounds are normal. There is distension.     Palpations: Abdomen is soft.     Hernia: No hernia is present.  Musculoskeletal:        General: No swelling or signs of injury.  Skin:    General: Skin is warm.     Capillary Refill: Capillary refill takes less than 2 seconds.  Neurological:     General: No focal deficit present.     Mental Status: He is alert and oriented to person, place, and time. Mental status is at baseline.  Psychiatric:        Mood and Affect: Mood normal.        Behavior: Behavior normal.        Thought Content: Thought content normal.        Judgment: Judgment normal.     BP (!) 157/91   Pulse 86   Temp 97.9 F (36.6 C) (Temporal)   SpO2 96%  Wt Readings from Last 3 Encounters:  10/01/22 280 lb (127 kg)  09/13/22 275 lb (124.7 kg)  09/04/22 291 lb 8 oz (132.2 kg)     Health Maintenance  Topic Date Due   DTaP/Tdap/Td (1 - Tdap) Never done   Hepatitis C Screening  08/08/2023 (Originally 10/14/1974)   HIV Screening  08/08/2023 (Originally 10/14/1971)   COLONOSCOPY (Pts 45-105yr Insurance coverage will need to be confirmed)  09/13/2025   HPV VACCINES  Aged Out   INFLUENZA VACCINE  Discontinued   COVID-19 Vaccine  Discontinued   Zoster Vaccines- Shingrix  Discontinued    There are no preventive care reminders to display for this  patient.  No results found for: "TSH" Lab Results  Component Value Date   WBC 8.6 07/06/2017   HGB 15.6 07/06/2017  HCT 47.1 07/06/2017   MCV 87.9 07/06/2017   PLT 186 07/06/2017   Lab Results  Component Value Date   NA 138 07/06/2017   K 3.8 07/06/2017   CO2 24 07/06/2017   GLUCOSE 139 (H) 07/06/2017   BUN 14 07/06/2017   CREATININE 1.32 (H) 07/06/2017   BILITOT 0.5 04/29/2012   ALKPHOS 60 04/29/2012   AST 20 04/29/2012   ALT 35 04/29/2012   PROT 7.5 04/29/2012   ALBUMIN 3.8 04/29/2012   CALCIUM 9.1 07/06/2017   ANIONGAP 10 07/06/2017   No results found for: "CHOL" No results found for: "HDL" No results found for: "LDLCALC" No results found for: "TRIG" No results found for: "CHOLHDL" No results found for: "HGBA1C"    Assessment & Plan:   Problem List Items Addressed This Visit       Cardiovascular and Mediastinum   Primary hypertension - Primary    Patient BP  Vitals:   09/28/22 1442 09/28/22 1444  BP: (!) 156/90 (!) 157/91    in the office today. Advised pt to follow a low sodium and heart healthy diet. Started him on losartan 50 mg daily. Encourage patient to check blood pressure at home and bring the readings to next appointment      Relevant Medications   losartan (COZAAR) 50 MG tablet     Respiratory   Asthma    Stable on medication.  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 Advised patient to avoid water sports or activity. We will recheck him in 2 weeks        Meds ordered this encounter  Medications   amoxicillin (AMOXIL) 500 MG capsule    Sig: Take 1 capsule (500 mg total) by mouth 2 (two) times daily for 10 days.    Dispense:  20 capsule    Refill:  0   losartan (COZAAR) 50 MG tablet    Sig: Take 1 tablet (50 mg total) by mouth daily.    Dispense:  30 tablet    Refill:  1     Follow-up: No follow-ups on file.    Theresia Lo, NP

## 2022-10-01 ENCOUNTER — Emergency Department (HOSPITAL_COMMUNITY)
Admission: EM | Admit: 2022-10-01 | Discharge: 2022-10-01 | Disposition: A | Discharge status: Home / Self Care | Payer: Self-pay | Attending: Emergency Medicine | Admitting: Emergency Medicine

## 2022-10-01 ENCOUNTER — Other Ambulatory Visit: Payer: Self-pay

## 2022-10-01 DIAGNOSIS — Z8507 Personal history of malignant neoplasm of pancreas: Secondary | ICD-10-CM | POA: Insufficient documentation

## 2022-10-01 DIAGNOSIS — Z8669 Personal history of other diseases of the nervous system and sense organs: Secondary | ICD-10-CM

## 2022-10-01 DIAGNOSIS — R319 Hematuria, unspecified: Secondary | ICD-10-CM

## 2022-10-01 DIAGNOSIS — M545 Low back pain, unspecified: Secondary | ICD-10-CM | POA: Insufficient documentation

## 2022-10-01 LAB — URINALYSIS, ROUTINE W REFLEX MICROSCOPIC
Bacteria, UA: NONE SEEN
Bilirubin Urine: NEGATIVE
Glucose, UA: NEGATIVE mg/dL
Ketones, ur: NEGATIVE mg/dL
Leukocytes,Ua: NEGATIVE
Nitrite: NEGATIVE
Protein, ur: 30 mg/dL — AB
Specific Gravity, Urine: 1.021 (ref 1.005–1.030)
pH: 6 (ref 5.0–8.0)

## 2022-10-01 MED ORDER — ACETAMINOPHEN 325 MG PO TABS
650.0000 mg | ORAL_TABLET | Freq: Once | ORAL | Status: AC
  Administered 2022-10-01: 650 mg via ORAL
  Filled 2022-10-01: qty 2

## 2022-10-01 MED ORDER — LIDOCAINE 5 % EX PTCH
1.0000 | MEDICATED_PATCH | Freq: Once | CUTANEOUS | Status: DC
  Administered 2022-10-01: 1 via TRANSDERMAL
  Filled 2022-10-01: qty 1

## 2022-10-01 NOTE — ED Triage Notes (Addendum)
Patient reports lower back pain, right side, radiating toward groin area and down legs. Pain rated 10/10. Pain started 1 year ago but has gotten worse  Denies injury. Denies trouble with urinating.  Patient reports pcp did colonoscopy and findings WNL Went to [facility] for imaging of lower back 2 months ago

## 2022-10-01 NOTE — Discharge Instructions (Addendum)
You were evaluated in the emergency department today for right-sided lower back pain and darkened urine.  Your overall work-up today was unremarkable with the exception of blood noted in your urine.  Due to this finding, recommend following up closely with urology.    Also recommend following up closely with your PCP in the next 2 to 3 days, and considering consultation with neurology for further discussions on back pain management.  Their contact information has been provided for you, please see your PCP FIRST before calling to schedule an appointment with neurology, as they may have further recommendation for you to try or specialists you may be able to see as well.    Continue to manage pain with over-the-counter remedies, heating pads, and lidocaine patches.  I have sent refills of Mobic and lidocaine patches to your pharmacy.  Remember to use lidocaine patches over the heating pad, do not use both at the same time, as this puts you at risk for skin burns.  You may take Tylenol in addition to the Mobic.  Return to the ED for new or worsening symptoms as discussed.

## 2022-10-01 NOTE — ED Provider Notes (Signed)
[facility] DEPT Provider Note   CSN: [id number] Arrival date & time: 10/01/22  0759     History {Add pertinent medical, surgical, social history, OB history to HPI:1} Chief Complaint  Patient presents with   Back Pain    Aaron Clarke is a 66 y.o. 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  States the pain is on the right side of the lower back, radiates down into the groin and down the right leg.  States this has been on and off for the last year, worsening over the last few months.  Was seen in the [facility] ED 2 to 3 months ago, waited 15 hours in the waiting room, had imaging performed.  Was told there may be some relation to a kidney stone, however was not diagnosed with one.  Followed up with his PCP and was sent for colonoscopy which came back negative.  Has follow-up with PCP in 2 weeks, though states last night the pain became worse and had difficulty sleeping.  States the pain chronically waxes and wanes.  Usually mild pain in the morning, improves throughout the day, and little to no pain at nighttime.  Denies urinary or bowel incontinence.  Denies changes in bowel habits or urinary symptoms other than some mild darkening urine color over the last few months.  This has not changed, and was in fact present prior to the imaging and lab work performed when he was seen at [facility] ED.  Denies penile pain swelling or discharge, or testicular pain or swelling.  Denies weakness, numbness, swelling in the lower extremities.  Denies IVDU, hemoptysis, or new injury/trauma.   Reports minimal pain at this time.  The history is provided by the patient and medical records.  Back Pain      Home Medications Prior to Admission medications   Medication Sig Start Date End Date Taking? Authorizing Provider   albuterol (VENTOLIN HFA) 108 (90 Base) MCG/ACT inhaler Inhale 1-2 puffs into the lungs every 6 (six) hours as needed for wheezing or shortness of breath. 08/20/22   Francene Finders, PA-C  amoxicillin (AMOXIL) 500 MG capsule Take 1 capsule (500 mg total) by mouth 2 (two) times daily for 10 days. 09/28/22 10/08/22  Theresia Lo, NP  losartan (COZAAR) 50 MG tablet Take 1 tablet (50 mg total) by mouth daily. 09/28/22   Theresia Lo, NP  meloxicam (MOBIC) 7.5 MG tablet Take 1 tablet (7.5 mg total) by mouth daily. Patient not taking: Reported on 09/28/2022 09/04/22   Cletis Athens, MD      Allergies    Patient has no known allergies.    Review of Systems   Review of Systems  Musculoskeletal:  Positive for back pain.    Physical Exam Updated Vital Signs BP (!) 178/95 (BP Location: Left Arm)   Pulse 87   Temp 98.1 F (36.7 C) (Oral)   Resp 17   Ht 6' (1.829 m)   Wt 127 kg   SpO2 97%   BMI 37.97 kg/m  Physical Exam Vitals and nursing note reviewed.  Constitutional:      General: He is not in acute distress.    Appearance: He is well-developed. He is not ill-appearing, toxic-appearing or diaphoretic.  HENT:     Head: Normocephalic and atraumatic.  Eyes:     Conjunctiva/sclera: Conjunctivae  normal.  Neck:     Comments: Very supple on exam, no meningismus Cardiovascular:     Rate and Rhythm: Normal rate and regular rhythm.     Heart sounds: No murmur heard.    Comments: DP and PT pulses 2+ bilaterally. Pulmonary:     Effort: Pulmonary effort is normal. No respiratory distress.     Breath sounds: Normal breath sounds.  Abdominal:     General: Abdomen is protuberant. Bowel sounds are normal. There is no distension.     Palpations: Abdomen is soft. There is no shifting dullness.     Tenderness: There is no abdominal tenderness. There is no right CVA tenderness, left CVA tenderness or guarding. Negative signs include Murphy's sign and McBurney's sign.  Musculoskeletal:         General: No swelling.     Cervical back: Neck supple. No rigidity.       Back:     Right lower leg: No edema.     Left lower leg: No edema.     Comments: ROM of hips, back, lower extremities appears grossly intact.  Gait intact.  Coordination appears grossly intact.  Skin:    General: Skin is warm and dry.     Capillary Refill: Capillary refill takes less than 2 seconds.  Neurological:     Mental Status: He is alert and oriented to person, place, and time.     GCS: GCS eye subscore is 4. GCS verbal subscore is 5. GCS motor subscore is 6.     Sensory: No sensory deficit.     Motor: No weakness.     Coordination: Coordination normal.     Gait: Gait normal.     Deep Tendon Reflexes: Reflexes normal.     Comments: No saddle anesthesia.  Normal patellar and Achilles reflexes bilaterally.  Lower extremities appear grossly intact sensation.  Mildly positive SLR of right lower extremity.  Psychiatric:        Mood and Affect: Mood normal.     ED Results / Procedures / Treatments   Labs (all labs ordered are listed, but only abnormal results are displayed) Labs Reviewed  URINALYSIS, ROUTINE W REFLEX MICROSCOPIC    EKG None  Radiology No results found.  Procedures Procedures  {Document cardiac monitor, telemetry assessment procedure when appropriate:1}  Medications Ordered in ED Medications  acetaminophen (TYLENOL) tablet 650 mg (has no administration in time range)    ED Course/ Medical Decision Making/ A&P                           Medical Decision Making  66 y.o. male presents to the ED for concern of Back Pain   This involves an extensive number of treatment options, and is a complaint that carries with it a high risk of complications and morbidity.  The emergent differential diagnosis prior to evaluation includes, but is not limited to: Sprain, sciatica, discitis, cauda equina  This is not an exhaustive differential.   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  Notably recent [facility] CT results, see below Results - [place] (STONE) (07/14/2022 1:06 PM EDT) Impressions 07/14/2022 1:32 PM EDT 1.  Possible mild inflammation versus prominent vessels around a sigmoid diverticulum in the right hemidiaphragm.  In the correct clinical context, this could reflect early acute diverticulitis.  2.  No obstructive uropathy. Bilateral nonobstructing punctate nephrolithiasis.  3.  Soft tissue density in the anterior pancreatic body. This could reflect a lymph node, cyst, or mass. Recommend nonemergent CT or MRI abdomen (pancreas protocol) for further evaluation.   Lab Tests: I ordered, and personally interpreted labs.  The pertinent results include:   UA  Imaging Studies: None  ED Course: Pt well-appearing on exam.  Presenting to the ED today due to acute on chronic lower back pain.  States the pain is on the right side of the lower back, radiates down into the groin and down the right leg.  Pain has been intermittent over the last year, worsening over the last few months.  Was seen in the [facility] ED 2 to 3 months ago, waited 15 hours in the waiting room, had imaging performed.  Was told there may be some relation to a kidney stone, however was not diagnosed with one.  Followed up with his PCP and was sent for colonoscopy which came back negative.  Has follow-up with PCP in 2 weeks, though states last night the pain became worse and had difficulty sleeping.  Again pain chronically waxes and wanes.  Usually mild pain in the morning, improves throughout the day, and little to no pain at nighttime.  No urinary or bowel incontinence.  No urinary symptoms other than some mild darkening urine color over the last few months.   This has not changed, and was in fact present prior to the imaging and lab work performed when he was seen at [facility] ED.  Without weakness, numbness, swelling in the lower extremities.  No clinical evidence of DVT.  No IVDU, hemoptysis, or new injury/trauma.  Pt reports concern over urine discoloration.  UA pending.   Presentation clinically indicative of sciatica or pinched nerve.  Tenderness of the right SI joint with tenderness in the right hip/glutes wrapping into groin.  No bony tenderness of the hip.  Abdomen soft and nontender.  Without midline spinal tenderness or abnormality.  Doubt acute fracture or dislocation in the spine, pain chronic in nature with waxing and waning symptoms over months to years, considered x-ray imaging of lumbar spine however I believe this may be appropriately deferred at this time.  No neurological deficits and normal neuro exam as described above.  No loss of bowel or bladder control, or saddle anesthesia.  Low concern for cauda equina, discitis, or infectious spinal process.  No fever, night sweats, weight loss, h/o cancer, IVDU.  Mildly positive SLR on right.  Again known hx of sciatica.  Lower extremities appear neurovascularly intact.  Patient can ambulate well without much difficulty.  Pain managed in ED.  Heat therapy and conservative symptom mangagement discussed with patient.  Recommend close follow up with PCP/neurosurgery. Patient in NAD and in good condition at time of discharge.  Disposition: After consideration the patient's encounter today, I do not feel today's workup suggests an emergent condition requiring admission or immediate intervention beyond what has been performed at this time.  Safe for discharge; instructed to return immediately for worsening symptoms, change in symptoms or any other concerns.  I have reviewed the patients home medicines and have made adjustments as needed.  Discussed course of treatment with the patient, whom  demonstrated understanding.  Patient in agreement and has no further questions.    This chart was dictated using voice recognition software.  Despite  best efforts to proofread, errors can occur which can change the documentation meaning.   {Document critical care time when appropriate:1} {Document review of labs and clinical decision tools ie heart score, Chads2Vasc2 etc:1}  {Document your independent review of radiology images, and any outside records:1} {Document your discussion with family members, caretakers, and with consultants:1} {Document social determinants of health affecting pt's care:1} {Document your decision making why or why not admission, treatments were needed:1} Final Clinical Impression(s) / ED Diagnoses Final diagnoses:  Right low back pain, unspecified chronicity, unspecified whether sciatica present    Rx / DC Orders ED Discharge Orders     None

## 2022-10-03 ENCOUNTER — Telehealth (HOSPITAL_COMMUNITY): Payer: Self-pay | Admitting: Emergency Medicine

## 2022-10-03 DIAGNOSIS — M1710 Unilateral primary osteoarthritis, unspecified knee: Secondary | ICD-10-CM

## 2022-10-03 DIAGNOSIS — M5440 Lumbago with sciatica, unspecified side: Secondary | ICD-10-CM

## 2022-10-03 MED ORDER — MELOXICAM 7.5 MG PO TABS: 7.5000 mg | ORAL_TABLET | Freq: Every day | ORAL | 0 refills | Status: DC

## 2022-10-03 MED ORDER — LIDOCAINE 4 % EX PTCH: 1.0000 | MEDICATED_PATCH | CUTANEOUS | 0 refills | Status: DC

## 2022-10-03 NOTE — Telephone Encounter (Signed)
Intended to send prescription for zofran and lidocaine patches to pharmacy.  Sending these prescriptions related to recent ED visit for back pain.

## 2022-10-11 ENCOUNTER — Encounter: Payer: Self-pay | Admitting: Nurse Practitioner

## 2022-10-11 ENCOUNTER — Ambulatory Visit (INDEPENDENT_AMBULATORY_CARE_PROVIDER_SITE_OTHER): Payer: Self-pay | Admitting: Nurse Practitioner

## 2022-10-11 VITALS — BP 174/97 | HR 82 | Temp 97.8°F

## 2022-10-11 DIAGNOSIS — I10 Essential (primary) hypertension: Secondary | ICD-10-CM

## 2022-10-11 DIAGNOSIS — M5440 Lumbago with sciatica, unspecified side: Secondary | ICD-10-CM

## 2022-10-11 MED ORDER — GABAPENTIN 100 MG PO CAPS: 100.0000 mg | ORAL_CAPSULE | Freq: Two times a day (BID) | ORAL | 3 refills | Status: DC

## 2022-10-11 NOTE — Progress Notes (Signed)
Established Patient Office Visit  Subjective:  Patient ID: Aaron Clarke, male    DOB: [birth year]  Age: 66 y.o. MRN: [id number]  CC:  Chief Complaint  Patient presents with   Hypertension    Does check his bP occasionally at home and he says usually pretty good. Declines SOB, blurry vision, chest pain but has headaches sometimes.      HPI  Aaron Clarke presents for follow-up on hypertension and he also complains of leg pain.  He states that he checks blood pressure at home but he do not remember the numbers.   Hypertension Pertinent negatives include no shortness of breath.  Leg Pain  The incident occurred more than 1 week ago. The incident occurred at home. There was no injury mechanism. The pain is present in the left thigh. The quality of the pain is described as burning. The pain is at a severity of 10/10. The pain is severe. Associated symptoms include numbness. Pertinent negatives include no inability to bear weight. The symptoms are aggravated by movement. He has tried NSAIDs and acetaminophen for the symptoms. The treatment provided mild relief.     Past Medical History:  Diagnosis Date   Asthma    Cholelithiases    Chronic kidney disease    Kidney stone 04/2011   Family history of breast cancer    mother    Past Surgical History:  Procedure Laterality Date   CHOLECYSTECTOMY  12/19/2011   Procedure: LAPAROSCOPIC CHOLECYSTECTOMY WITH INTRAOPERATIVE CHOLANGIOGRAM;  Surgeon: Adin Hector, MD;  Location: [place];  Service: General;  Laterality: N/A;   COLONOSCOPY WITH PROPOFOL N/A 09/13/2022   Procedure: COLONOSCOPY WITH PROPOFOL;  Surgeon: Lin Landsman, MD;  Location: [facility] END[facility]COPY;  Service: Gastroenterology;  Laterality: N/A;   Colonscopy     Fatty tumor  2003   L chest    Family History  Problem Relation Age of Onset   Cancer Mother        breast   Cancer Father        colon   Heart disease Father    Asthma Sister    Anesthesia  problems Neg Hx     Social History   Socioeconomic History   Marital status: Significant Other    Spouse name: Not on file   Number of children: Not on file   Years of education: Not on file   Highest education level: Not on file  Occupational History   Not on file  Tobacco Use   Smoking status: Former    Years: 14.00    Types: Cigarettes    Quit date: 06/19/1988    Years since quitting: 34.3   Smokeless tobacco: Never  Substance and Sexual Activity   Alcohol use: Yes    Comment: 2 -3 per day   Drug use: No   Sexual activity: Not on file  Other Topics Concern   Not on file  Social History Narrative   Not on file   Social Determinants of Health   Financial Resource Strain: Not on file  Food Insecurity: Not on file  Transportation Needs: Not on file  Physical Activity: Not on file  Stress: Not on file  Social Connections: Not on file  Intimate Partner Violence: Not on file     Outpatient Medications Prior to Visit  Medication Sig Dispense Refill   albuterol (VENTOLIN HFA) 108 (90 Base) MCG/ACT inhaler Inhale 1-2 puffs into the lungs every 6 (six) hours as needed for wheezing or shortness of  breath. 8 g 0   lidocaine 4 % Place 1 patch onto the skin daily. 15 patch 0   losartan (COZAAR) 50 MG tablet Take 1 tablet (50 mg total) by mouth daily. 30 tablet 1   meloxicam (MOBIC) 7.5 MG tablet Take 1 tablet (7.5 mg total) by mouth daily. 14 tablet 0   Facility-Administered Medications Prior to Visit  Medication Dose Route Frequency Provider Last Rate Last Admin   diclofenac Sodium (VOLTAREN) 1 % topical gel 2 g  2 g Topical QID Masoud, Viann Shove, MD       ofloxacin (FLOXIN) 0.3 % OTIC (EAR) solution 5 drop  5 drop Left EAR Daily Masoud, Viann Shove, MD        No Known Allergies  ROS Review of Systems  Constitutional: Negative.   HENT: Negative.    Eyes: Negative.   Respiratory:  Negative for chest tightness and shortness of breath.   Cardiovascular: Negative.    Gastrointestinal: Negative.   Endocrine: Negative.   Genitourinary: Negative.   Musculoskeletal:        Left upper leg pain  Neurological:  Positive for numbness.      Objective:    Physical Exam Constitutional:      Appearance: Normal appearance. He is obese.  HENT:     Head: Normocephalic.     Right Ear: Tympanic membrane normal.     Left Ear: Tympanic membrane normal.     Nose: Nose normal.     Mouth/Throat:     Mouth: Mucous membranes are moist.     Pharynx: Oropharynx is clear.  Eyes:     Conjunctiva/sclera: Conjunctivae normal.     Pupils: Pupils are equal, round, and reactive to light.  Cardiovascular:     Rate and Rhythm: Normal rate and regular rhythm.     Pulses: Normal pulses.     Heart sounds: Normal heart sounds.  Pulmonary:     Effort: Pulmonary effort is normal.     Breath sounds: Normal breath sounds.  Abdominal:     General: Bowel sounds are normal. There is no distension.     Palpations: Abdomen is soft.     Hernia: No hernia is present.  Musculoskeletal:        General: Tenderness present.     Cervical back: Normal range of motion.     Comments: Tenderness with straight leg raise.  Neurological:     General: No focal deficit present.     Mental Status: He is alert and oriented to person, place, and time. Mental status is at baseline.  Psychiatric:        Mood and Affect: Mood normal.        Behavior: Behavior normal.        Thought Content: Thought content normal.        Judgment: Judgment normal.     BP (!) 174/97   Pulse 82   Temp 97.8 F (36.6 C) (Temporal)   SpO2 95%  Wt Readings from Last 3 Encounters:  10/30/22 274 lb (124.3 kg)  10/23/22 279 lb 15.8 oz (127 kg)  10/01/22 280 lb (127 kg)     Health Maintenance  Topic Date Due   DTaP/Tdap/Td (1 - Tdap) Never done   Hepatitis C Screening  08/08/2023 (Originally 10/14/1974)   COLONOSCOPY (Pts 45-32yr Insurance coverage will need to be confirmed)  09/13/2025   HIV Screening   Completed   HPV VACCINES  Aged Out   INFLUENZA VACCINE  Discontinued   COVID-19 Vaccine  Discontinued   Zoster Vaccines- Shingrix  Discontinued    There are no preventive care reminders to display for this patient.  No results found for: "TSH" Lab Results  Component Value Date   WBC 5.3 10/23/2022   HGB 16.3 10/23/2022   HCT 47.5 10/23/2022   MCV 86.5 10/23/2022   PLT 136 (L) 10/23/2022   Lab Results  Component Value Date   NA 139 10/24/2022   K 4.5 10/24/2022   CO2 25 10/24/2022   GLUCOSE 162 (H) 10/24/2022   BUN 16 10/24/2022   CREATININE 1.14 10/24/2022   BILITOT 0.5 10/24/2022   ALKPHOS 44 10/24/2022   AST 21 10/24/2022   ALT 18 10/24/2022   PROT 6.3 (L) 10/24/2022   ALBUMIN 3.4 (L) 10/24/2022   CALCIUM 9.1 10/24/2022   ANIONGAP 8 10/24/2022   Lab Results  Component Value Date   CHOL 116 10/24/2022   Lab Results  Component Value Date   HDL 36 (L) 10/24/2022   Lab Results  Component Value Date   LDLCALC 74 10/24/2022   Lab Results  Component Value Date   TRIG 30 10/24/2022   Lab Results  Component Value Date   CHOLHDL 3.2 10/24/2022   No results found for: "HGBA1C"    Assessment & Plan:   Problem List Items Addressed This Visit       Cardiovascular and Mediastinum   Primary hypertension    Patient BP  Vitals:   10/11/22 1153 10/11/22 1154  BP: (!) 177/99 (!) 174/97    in the office today. Encourage patient to cut down on salt and consume heart healthy diet. Continue losartan 50 mg daily        Nervous and Auditory   Lumbago of lumbar region with sciatica - Primary    Started him on gabapentin 100 mg twice daily. Advised patient to perform alternate hot and cold pack. Will continue to monitor      Relevant Medications   gabapentin (NEURONTIN) 100 MG capsule     Meds ordered this encounter  Medications   gabapentin (NEURONTIN) 100 MG capsule    Sig: Take 1 capsule (100 mg total) by mouth 2 (two) times daily.    Dispense:  90  capsule    Refill:  3     Follow-up: No follow-ups on file.    Theresia Lo, NP

## 2022-10-18 ENCOUNTER — Encounter: Payer: Self-pay | Admitting: Nurse Practitioner

## 2022-10-18 DIAGNOSIS — I10 Essential (primary) hypertension: Secondary | ICD-10-CM | POA: Insufficient documentation

## 2022-10-18 NOTE — Assessment & Plan Note (Signed)
Started him on amoxicillin started him on amoxicillin. Advised patient to avoid water sports or activity. We will recheck him in 2 weeks

## 2022-10-18 NOTE — Assessment & Plan Note (Signed)
Stable on medication.  Next

## 2022-10-18 NOTE — Assessment & Plan Note (Signed)
Patient BP  Vitals:   09/28/22 1442 09/28/22 1444  BP: (!) 156/90 (!) 157/91    in the office today. Advised pt to follow a low sodium and heart healthy diet. Started him on losartan 50 mg daily. Encourage patient to check blood pressure at home and bring the readings to next appointment

## 2022-10-23 ENCOUNTER — Inpatient Hospital Stay (HOSPITAL_COMMUNITY): Payer: Self-pay

## 2022-10-23 ENCOUNTER — Emergency Department (HOSPITAL_COMMUNITY): Payer: Self-pay

## 2022-10-23 ENCOUNTER — Inpatient Hospital Stay (HOSPITAL_COMMUNITY)
Admission: EM | Admit: 2022-10-23 | Discharge: 2022-10-24 | DRG: 175 | Disposition: A | Discharge status: Home / Self Care | Payer: Self-pay | Attending: Internal Medicine | Admitting: Internal Medicine

## 2022-10-23 ENCOUNTER — Encounter (HOSPITAL_COMMUNITY): Payer: Self-pay | Admitting: Emergency Medicine

## 2022-10-23 DIAGNOSIS — I2699 Other pulmonary embolism without acute cor pulmonale: Secondary | ICD-10-CM

## 2022-10-23 DIAGNOSIS — I2694 Multiple subsegmental pulmonary emboli without acute cor pulmonale: Principal | ICD-10-CM | POA: Diagnosis present

## 2022-10-23 DIAGNOSIS — Z8249 Family history of ischemic heart disease and other diseases of the circulatory system: Secondary | ICD-10-CM

## 2022-10-23 DIAGNOSIS — R0609 Other forms of dyspnea: Secondary | ICD-10-CM

## 2022-10-23 DIAGNOSIS — R0902 Hypoxemia: Secondary | ICD-10-CM

## 2022-10-23 DIAGNOSIS — G629 Polyneuropathy, unspecified: Secondary | ICD-10-CM | POA: Diagnosis present

## 2022-10-23 DIAGNOSIS — J9601 Acute respiratory failure with hypoxia: Secondary | ICD-10-CM | POA: Diagnosis present

## 2022-10-23 DIAGNOSIS — I1310 Hypertensive heart and chronic kidney disease without heart failure, with stage 1 through stage 4 chronic kidney disease, or unspecified chronic kidney disease: Secondary | ICD-10-CM | POA: Diagnosis present

## 2022-10-23 DIAGNOSIS — M1611 Unilateral primary osteoarthritis, right hip: Secondary | ICD-10-CM | POA: Diagnosis present

## 2022-10-23 DIAGNOSIS — G8929 Other chronic pain: Secondary | ICD-10-CM | POA: Diagnosis present

## 2022-10-23 DIAGNOSIS — Z825 Family history of asthma and other chronic lower respiratory diseases: Secondary | ICD-10-CM

## 2022-10-23 DIAGNOSIS — N182 Chronic kidney disease, stage 2 (mild): Secondary | ICD-10-CM | POA: Diagnosis present

## 2022-10-23 DIAGNOSIS — Z791 Long term (current) use of non-steroidal anti-inflammatories (NSAID): Secondary | ICD-10-CM

## 2022-10-23 DIAGNOSIS — J45901 Unspecified asthma with (acute) exacerbation: Secondary | ICD-10-CM | POA: Diagnosis present

## 2022-10-23 DIAGNOSIS — Z79899 Other long term (current) drug therapy: Secondary | ICD-10-CM

## 2022-10-23 DIAGNOSIS — Z87891 Personal history of nicotine dependence: Secondary | ICD-10-CM

## 2022-10-23 DIAGNOSIS — I493 Ventricular premature depolarization: Secondary | ICD-10-CM | POA: Diagnosis present

## 2022-10-23 DIAGNOSIS — R7989 Other specified abnormal findings of blood chemistry: Secondary | ICD-10-CM

## 2022-10-23 HISTORY — DX: Other pulmonary embolism without acute cor pulmonale: I26.99

## 2022-10-23 LAB — BASIC METABOLIC PANEL
Anion gap: 6 (ref 5–15)
BUN: 16 mg/dL (ref 8–23)
CO2: 26 mmol/L (ref 22–32)
Calcium: 8.5 mg/dL — ABNORMAL LOW (ref 8.9–10.3)
Chloride: 108 mmol/L (ref 98–111)
Creatinine, Ser: 1.27 mg/dL — ABNORMAL HIGH (ref 0.61–1.24)
GFR, Estimated: >60 mL/min (ref >60)
Glucose, Bld: 121 mg/dL — ABNORMAL HIGH (ref 70–99)
Potassium: 3.3 mmol/L — ABNORMAL LOW (ref 3.5–5.1)
Sodium: 140 mmol/L (ref 135–145)

## 2022-10-23 LAB — CBC WITH DIFFERENTIAL/PLATELET
Abs Immature Granulocytes: 0.02 10*3/uL (ref 0.00–0.07)
Basophils Absolute: 0.1 10*3/uL (ref 0.0–0.1)
Basophils Relative: 1 %
Eosinophils Absolute: 0.4 10*3/uL (ref 0.0–0.5)
Eosinophils Relative: 8 %
HCT: 47.5 % (ref 39.0–52.0)
Hemoglobin: 16.3 g/dL (ref 13.0–17.0)
Immature Granulocytes: 0 %
Lymphocytes Relative: 18 %
Lymphs Abs: 0.9 10*3/uL (ref 0.7–4.0)
MCH: 29.7 pg (ref 26.0–34.0)
MCHC: 34.3 g/dL (ref 30.0–36.0)
MCV: 86.5 fL (ref 80.0–100.0)
Monocytes Absolute: 0.4 10*3/uL (ref 0.1–1.0)
Monocytes Relative: 8 %
Neutro Abs: 3.5 10*3/uL (ref 1.7–7.7)
Neutrophils Relative %: 65 %
Platelets: 136 10*3/uL — ABNORMAL LOW (ref 150–400)
RBC: 5.49 MIL/uL (ref 4.22–5.81)
RDW: 13.3 % (ref 11.5–15.5)
WBC: 5.3 10*3/uL (ref 4.0–10.5)
nRBC: 0 % (ref 0.0–0.2)

## 2022-10-23 LAB — CREATININE, SERUM
Creatinine, Ser: 1.15 mg/dL (ref 0.61–1.24)
GFR, Estimated: >60 mL/min (ref >60)

## 2022-10-23 LAB — CK: Total CK: 160 U/L (ref 49–397)

## 2022-10-23 LAB — BRAIN NATRIURETIC PEPTIDE: B Natriuretic Peptide: 74.7 pg/mL (ref 0.0–100.0)

## 2022-10-23 LAB — TROPONIN I (HIGH SENSITIVITY): Troponin I (High Sensitivity): 157 ng/L (ref <18)

## 2022-10-23 MED ORDER — MORPHINE SULFATE (PF) 4 MG/ML IV SOLN
4.0000 mg | Freq: Once | INTRAVENOUS | Status: AC
  Administered 2022-10-23: 4 mg via INTRAVENOUS
  Filled 2022-10-23: qty 1

## 2022-10-23 MED ORDER — METHYLPREDNISOLONE SODIUM SUCC 40 MG IJ SOLR
40.0000 mg | Freq: Every day | INTRAMUSCULAR | Status: DC
  Administered 2022-10-23 – 2022-10-24 (×2): 40 mg via INTRAVENOUS
  Filled 2022-10-23 (×3): qty 1

## 2022-10-23 MED ORDER — POLYETHYLENE GLYCOL 3350 17 G PO PACK: 17.0000 g | PACK | Freq: Every day | ORAL | Status: DC | PRN

## 2022-10-23 MED ORDER — IOHEXOL 350 MG/ML SOLN
75.0000 mL | Freq: Once | INTRAVENOUS | Status: AC | PRN
  Administered 2022-10-23: 75 mL via INTRAVENOUS

## 2022-10-23 MED ORDER — OXYCODONE-ACETAMINOPHEN 5-325 MG PO TABS
1.0000 | ORAL_TABLET | Freq: Four times a day (QID) | ORAL | Status: DC | PRN
  Administered 2022-10-24: 2 via ORAL
  Filled 2022-10-23: qty 2
  Filled 2022-10-23: qty 1

## 2022-10-23 MED ORDER — ONDANSETRON HCL 4 MG/2ML IJ SOLN
4.0000 mg | Freq: Once | INTRAMUSCULAR | Status: AC
  Administered 2022-10-23: 4 mg via INTRAVENOUS
  Filled 2022-10-23: qty 2

## 2022-10-23 MED ORDER — POTASSIUM CHLORIDE CRYS ER 20 MEQ PO TBCR
40.0000 meq | EXTENDED_RELEASE_TABLET | Freq: Once | ORAL | Status: AC
  Administered 2022-10-23: 40 meq via ORAL
  Filled 2022-10-23: qty 2

## 2022-10-23 MED ORDER — SENNOSIDES-DOCUSATE SODIUM 8.6-50 MG PO TABS
1.0000 | ORAL_TABLET | Freq: Every day | ORAL | Status: DC
  Administered 2022-10-23: 1 via ORAL
  Filled 2022-10-23: qty 1

## 2022-10-23 MED ORDER — HYDROMORPHONE HCL 1 MG/ML IJ SOLN: 0.5000 mg | INTRAMUSCULAR | Status: DC | PRN

## 2022-10-23 MED ORDER — GABAPENTIN 100 MG PO CAPS
100.0000 mg | ORAL_CAPSULE | Freq: Two times a day (BID) | ORAL | Status: DC
  Administered 2022-10-23 – 2022-10-24 (×2): 100 mg via ORAL
  Filled 2022-10-23 (×2): qty 1

## 2022-10-23 MED ORDER — PROCHLORPERAZINE EDISYLATE 10 MG/2ML IJ SOLN: 5.0000 mg | Freq: Four times a day (QID) | INTRAMUSCULAR | Status: DC | PRN

## 2022-10-23 MED ORDER — HEPARIN BOLUS VIA INFUSION
6000.0000 [IU] | Freq: Once | INTRAVENOUS | Status: AC
  Administered 2022-10-23: 6000 [IU] via INTRAVENOUS
  Filled 2022-10-23: qty 6000

## 2022-10-23 MED ORDER — DIAZEPAM 5 MG/ML IJ SOLN
5.0000 mg | Freq: Once | INTRAMUSCULAR | Status: AC
  Administered 2022-10-23: 5 mg via INTRAVENOUS
  Filled 2022-10-23: qty 2

## 2022-10-23 MED ORDER — OXYCODONE-ACETAMINOPHEN 5-325 MG PO TABS
2.0000 | ORAL_TABLET | Freq: Once | ORAL | Status: AC
  Administered 2022-10-23: 2 via ORAL
  Filled 2022-10-23: qty 2

## 2022-10-23 MED ORDER — HEPARIN (PORCINE) 25000 UT/250ML-% IV SOLN
1850.0000 [IU]/h | INTRAVENOUS | Status: AC
  Administered 2022-10-23: 1850 [IU]/h via INTRAVENOUS
  Filled 2022-10-23: qty 250

## 2022-10-23 MED ORDER — MELATONIN 5 MG PO TABS: 5.0000 mg | ORAL_TABLET | Freq: Every evening | ORAL | Status: DC | PRN

## 2022-10-23 MED ORDER — LOSARTAN POTASSIUM 50 MG PO TABS
50.0000 mg | ORAL_TABLET | Freq: Every day | ORAL | Status: DC
  Administered 2022-10-23 – 2022-10-24 (×2): 50 mg via ORAL
  Filled 2022-10-23 (×3): qty 1

## 2022-10-23 MED ORDER — ACETAMINOPHEN 325 MG PO TABS: 650.0000 mg | ORAL_TABLET | Freq: Four times a day (QID) | ORAL | Status: DC | PRN

## 2022-10-23 MED ORDER — ENOXAPARIN SODIUM 60 MG/0.6ML IJ SOSY
60.0000 mg | PREFILLED_SYRINGE | INTRAMUSCULAR | Status: DC
  Filled 2022-10-23: qty 0.6

## 2022-10-23 MED ORDER — PANTOPRAZOLE SODIUM 40 MG PO TBEC
40.0000 mg | DELAYED_RELEASE_TABLET | Freq: Every day | ORAL | Status: DC
  Administered 2022-10-24 (×2): 40 mg via ORAL
  Filled 2022-10-23 (×3): qty 1

## 2022-10-23 NOTE — ED Provider Triage Note (Signed)
Emergency Medicine Provider Triage Evaluation Note  Aaron Clarke , a 66 y.o. male  was evaluated in triage.  Pt complains of worsening shortness of breath over the past 1 to 2 months.  Patient also complains of lower extremity swelling and right-sided buttock/leg pain worse with standing.  Patient denies any recent trauma or injury.  He states that the right-sided leg/thigh pain is keeping him from walking at this time due to pain.  He states he has been evaluated in the emergency room's before for the same complaint with no imaging.  He has his primary care but no specialist for this pain.  He denies any heart failure history but does endorse history of asthma.  He states his shortness of breath feels different than asthma.  It is worse with lying down and worse at night.  He denies chest pain, abdominal pain, nausea, vomiting at this time  Review of Systems  Positive: As above Negative: As above  Physical Exam  SpO2 93%  Gen:   Awake, no distress   Resp:  Normal effort  MSK:   Moves extremities without difficulty  Other:  No tenderness to palpation of the right hip or right lumbar area, swelling noted to bilateral lower extremities  Medical Decision Making  Medically screening exam initiated at 10:19 AM.  Appropriate orders placed.  Howell Groesbeck was informed that the remainder of the evaluation will be completed by another provider, this initial triage assessment does not replace that evaluation, and the importance of remaining in the ED until their evaluation is complete.     Dorothyann Peng, PA-C 10/23/22 1021

## 2022-10-23 NOTE — ED Provider Notes (Signed)
[facility]ENC[facility] DEPARTMENT Provider Note   CSN: [id number] Arrival date & time: 10/23/22  1009     History  Chief Complaint  Patient presents with   Shortness of Breath    Aaron Clarke is a 66 y.o. male with PMH hypertension, osteoarthritis, chronic kidney disease who presents to the ED complaining of chronic pain in his legs.  Patient and wife state that pain has been going on at least 7 to 8 months and that now patient is having difficulty with ambulation due to the severe pain in his legs.  Patient describes pain as worse in bilateral thighs and particularly worse on the right side.  He notes that when he stands up he feels like both legs are going to "give out" and he has such significant pain that he starts to feel short of breath.  He denies feeling short of breath when he is at rest or any other time except when he has this pain.  He denies chest pain, fever, chills, abdominal pain, nausea, vomiting, diarrhea, or an injury to his back or hips.  He does note some lower back pain as well but this is also not new.  Patient and wife note that he has been evaluated in multiple EDs as well as by his primary care for the symptoms and placed on gabapentin and meloxicam for his arthritis.  Other than this, he has only been taking Tylenol for his pain.  Wife states that patient was referred to an orthopedist but has not followed up there. He has not completed any physical therapy for his symptoms.      Home Medications Prior to Admission medications   Medication Sig Start Date End Date Taking? Authorizing Provider  albuterol (VENTOLIN HFA) 108 (90 Base) MCG/ACT inhaler Inhale 1-2 puffs into the lungs every 6 (six) hours as needed for wheezing or shortness of breath. 08/20/22   Francene Finders, PA-C  gabapentin (NEURONTIN) 100 MG capsule Take 1 capsule (100 mg total) by mouth 2 (two) times daily. 10/11/22   Theresia Lo, NP  lidocaine 4 % Place 1 patch onto the  skin daily. 44/03/47   Prince Rome, PA-C  losartan (COZAAR) 50 MG tablet Take 1 tablet (50 mg total) by mouth daily. 09/28/22   Theresia Lo, NP  meloxicam (MOBIC) 7.5 MG tablet Take 1 tablet (7.5 mg total) by mouth daily. 42/59/56   Prince Rome, PA-C      Allergies    Patient has no known allergies.    Review of Systems   Review of Systems  Constitutional:  Positive for activity change. Negative for appetite change, chills, diaphoresis, fatigue, fever and unexpected weight change.  HENT:  Negative for congestion, ear pain, rhinorrhea and sore throat.   Eyes:  Negative for pain and visual disturbance.  Respiratory:  Positive for shortness of breath. Negative for apnea, cough, choking, chest tightness and wheezing.   Cardiovascular:  Negative for chest pain, palpitations and leg swelling.  Gastrointestinal:  Negative for abdominal pain, constipation, diarrhea, nausea and vomiting.  Genitourinary:  Negative for dysuria, hematuria and testicular pain.  Musculoskeletal:  Positive for back pain and gait problem. Negative for arthralgias.  Skin:  Negative for color change and rash.  Neurological:  Negative for seizures and syncope.  All other systems reviewed and are negative.   Physical Exam Updated Vital Signs BP (!) 151/83 (BP Location: Left Arm)   Pulse 75   Temp 98 F (36.7 C) (Oral)  Resp 15   Ht 6' (1.829 m)   Wt 127 kg   SpO2 95%   BMI 37.97 kg/m  Physical Exam Vitals and nursing note reviewed.  Constitutional:      General: He is not in acute distress.    Appearance: Normal appearance. He is obese. He is not ill-appearing, toxic-appearing or diaphoretic.  HENT:     Head: Normocephalic and atraumatic.     Mouth/Throat:     Mouth: Mucous membranes are moist.  Eyes:     Extraocular Movements: Extraocular movements intact.     Conjunctiva/sclera: Conjunctivae normal.  Cardiovascular:     Rate and Rhythm: Normal rate and regular rhythm.     Heart  sounds: Normal heart sounds. No murmur heard. Pulmonary:     Effort: Pulmonary effort is normal.     Breath sounds: Normal breath sounds. No decreased breath sounds, wheezing, rhonchi or rales.  Chest:     Chest wall: No deformity.  Abdominal:     General: Abdomen is flat.     Palpations: Abdomen is soft.     Tenderness: There is no abdominal tenderness. There is no guarding or rebound.  Musculoskeletal:     Cervical back: Neck supple.     Right lower leg: No edema.     Left lower leg: No edema.  Skin:    General: Skin is warm and dry.     Capillary Refill: Capillary refill takes less than 2 seconds.     Coloration: Skin is not pale.     Findings: No rash.  Neurological:     Mental Status: He is alert and oriented to person, place, and time. Mental status is at baseline.     Cranial Nerves: Cranial nerves 2-12 are intact.  Psychiatric:        Mood and Affect: Mood normal.        Behavior: Behavior normal.     ED Results / Procedures / Treatments   Labs (all labs ordered are listed, but only abnormal results are displayed) Labs Reviewed  BASIC METABOLIC PANEL - Abnormal; Notable for the following components:      Result Value   Potassium 3.3 (*)    Glucose, Bld 121 (*)    Creatinine, Ser 1.27 (*)    Calcium 8.5 (*)    All other components within normal limits  CBC WITH DIFFERENTIAL/PLATELET - Abnormal; Notable for the following components:   Platelets 136 (*)    All other components within normal limits  TROPONIN I (HIGH SENSITIVITY) - Abnormal; Notable for the following components:   Troponin I (High Sensitivity) 157 (*)    All other components within normal limits  BRAIN NATRIURETIC PEPTIDE  CK  HIV ANTIBODY (ROUTINE TESTING W REFLEX)  CBC  CREATININE, SERUM    EKG EKG reviewed--normal sinus rhythm, no ST-T segment changes, occasional ectopy  Radiology DG Hip Unilat W or Wo Pelvis 2-3 Views Right  Result Date: 10/23/2022 CLINICAL DATA:  Right hip pain  radiating to the right lower back. No recent falls or trauma. EXAM: DG HIP (WITH OR WITHOUT PELVIS) 2-3V RIGHT COMPARISON:  Abdominal radiograph 04/24/2012 FINDINGS: Mildly decreased bone mineralization. Severe superolateral right femoroacetabular joint space narrowing and peripheral osteophytosis. Mild-to-moderate right femoral head-neck junction circumferential degenerative osteophytosis. Bilateral severe superior right femoroacetabular subchondral sclerosis. Moderate superolateral left femoroacetabular joint space narrowing and superolateral left acetabular and femoral head-neck junction degenerative osteophytosis. Mild bilateral sacroiliac subchondral sclerosis. Mild bilateral sacroiliac subchondral sclerosis. No acute fracture or dislocation.  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 Electronically Signed   By: Yvonne Kendall M.D.   On: 10/23/2022 11:10   DG Lumbar Spine Complete  Result Date: 10/23/2022 CLINICAL DATA:  Right-sided radiculopathy. EXAM: LUMBAR SPINE - COMPLETE 4+ VIEW COMPARISON:  Chest and abdominal radiographs 04/30/2012, chest two views 03/29/2022, CT abdomen and pelvis 05/23/2011 FINDINGS: Visualization of the inferior thoracic ribs on prior radiographs limits counting the number of rib-bearing thoracic type vertebral bodies, however overall on prior radiographs and CT there appear to be 11 rib-bearing thoracic type vertebral bodies with very small hypoplastic ribs at the T12 level. Distal to this, the next 5 vertebral bodies are considered L1 through L5. Mild dextrocurvature centered at L2. Moderate left L2-3 and mild left L1-2 and right T12-L1 disc space narrowing. Chronic appearing concave degenerative Schmorl's nodes predominantly at the anterior aspect of the inferior L1 endplate, posterior aspect of the inferior L2 endplate, and superior greater than inferior L3 through L5 endplates. Large anterior T12-L1, L1-2, L3-4, and L5-S1 bridging osteophytes.  Mild anterior L2-3, mild posterior L4-5, and moderate to severe L5-S1 endplate osteophytes. Right upper quadrant likely cholecystectomy clips. IMPRESSION: 1. Moderate left L2-3 and mild left L1-2 and right T12-L1 disc space narrowing. 2. Large anterior T12-L1, L1-2, L3-4, and L5-S1 endplate osteophytes. 3. Mild dextrocurvature centered at L2. Electronically Signed   By: Yvonne Kendall M.D.   On: 10/23/2022 11:09   DG Chest 2 View  Result Date: 10/23/2022 CLINICAL DATA:  Right-sided radiculopathy.  Chest pain. EXAM: CHEST - 2 VIEW COMPARISON:  Chest two views 03/29/2022 and 07/06/2017 FINDINGS: Cardiac silhouette is at the upper limits of normal size. Mediastinal contours are within normal limits with mild calcification within the aortic arch. Mild chronic bilateral interstitial thickening within the bilateral lower lungs, similar to prior. No new acute airspace opacity is visualized. No pleural effusion or pneumothorax. Moderate multilevel disc space narrowing with large anterior level bridging degenerative osteophytes of the thoracic spine, similar to prior. Likely cholecystectomy clips are visualized on lateral view. IMPRESSION: 1. No acute cardiopulmonary process. 2. Mild chronic interstitial thickening within the bilateral lower lungs, similar to prior. This may represent mild interstitial scarring. Electronically Signed   By: Yvonne Kendall M.D.   On: 10/23/2022 11:01    Procedures None  Medications Ordered in ED Medications  enoxaparin (LOVENOX) injection 40 mg (has no administration in time range)  potassium chloride SA (KLOR-CON M) CR tablet 40 mEq (40 mEq Oral Given 10/23/22 1701)  oxyCODONE-acetaminophen (PERCOCET/ROXICET) 5-325 MG per tablet 2 tablet (2 tablets Oral Given 10/23/22 1701)  morphine (PF) 4 MG/ML injection 4 mg (4 mg Intravenous Given 10/23/22 1815)  ondansetron (ZOFRAN) injection 4 mg (4 mg Intravenous Given 10/23/22 1815)  diazepam (VALIUM) injection 5 mg (5 mg Intravenous Given  10/23/22 1816)    ED Course/ Medical Decision Making/ A&P                           Medical Decision Making Amount and/or Complexity of Data Reviewed Labs: ordered. Decision-making details documented in ED Course. Radiology: ordered. Decision-making details documented in ED Course. ECG/medicine tests: ordered. Decision-making details documented in ED Course.  Risk OTC drugs. Prescription drug management. Decision regarding hospitalization.   HEART Score for Major Cardiac Events from [email or web address]  on 10/23/2022 ** All calculations should be rechecked by clinician prior to use **  RESULT SUMMARY: 5 points Moderate Score (4-6 points)  Risk of MACE of  12-16.6%.  If troponin is positive, many experts recommend further workup and admission even with a low HEART Score.   INPUTS: History --> 1 = Moderately suspicious EKG --> 0 = Normal Age --> 1 = 45-64 Risk factors --> 1 = 1-2 risk factors Initial troponin --> 2 = >3 normal limit  TIMI Risk Score for UA/NSTEMI from [email or web address]  on 10/23/2022 ** All calculations should be rechecked by clinician prior to use **  RESULT SUMMARY: 1 points 5% risk at 14 days of: all-cause mortality, new or recurrent MI, or severe recurrent ischemia requiring urgent revascularization.   INPUTS: Age ?49 --> 0 = No ?3 CAD risk factors --> 0 = No Known CAD (stenosis ?50%) --> 0 = No ASA use in past 7 days --> 0 = No Severe angina (?2 episodes in 24 hrs) --> 0 = No EKG ST changes ?0.30m --> 0 = No Positive cardiac marker --> 1 = Yes  Wells' Criteria for Pulmonary Embolism from [email or web address] on 10/23/2022 ** All calculations should be rechecked by clinician prior to use **  RESULT SUMMARY: 0.0 points Low risk group: 1.3% chance of PE in an ED population.   Another study assigned scores ? 4 as "PE Unlikely" and had a 3% incidence of PE.   INPUTS: Clinical signs and symptoms of DVT --> 0 = No PE is #1 diagnosis OR equally likely --> 0 =  No Heart rate > 100 --> 0 = No Immobilization at least 3 days OR surgery in the previous 4 weeks --> 0 = No Previous, objectively diagnosed PE or DVT --> 0 = No Hemoptysis --> 0 = No Malignancy w/ treatment within 6 months or palliative --> 0 = No   On re-exam following Percocet ordered for pain, pt still having significant pain. Discussed with attending MD and ordered additional pain medication including morphine and Valium as well as Zofran for nausea. Shortly after receiving this medication, pt had hypoxic episode while at rest. States he was falling asleep when it happened and he did not subjectively feel short of breath. On monitor, pt had oxygen saturation 86% on room air. Around this same time, critical troponin result reported at 157. Revisited pt who now states yesterday he called EMS to his home due to an episode of severe left arm pain. Denies having any chest pain at that time but says pain in his arm persisted for about 4 hours and he declined EMS transport to hospital. Last night was when pt's wife noted he became hypoxic into the 70s when ambulating to the restroom.   This is a 66year old male who initially presented for chronic hip pain worse on the right that was causing him to have difficulty with ambulation.  Wife noted that he did have a period of hypoxia last night when ambulating to the restroom and patient has intermittently been complaining of shortness of breath for the past week.  No prior history of respiratory illness.  No known history of cardiac disease.  Pt denies subjective shortness of breath and says he only notices problems with his breathing when he is in severe pain. Patient says he came here today for the pain in his legs but with dyspnea and hypoxia further cardiac workup pursued.  With this, EKG was ordered which showed normal sinus rhythm with some occasional ectopy but no ST-T changes and initial troponin was also ordered which returned elevated at 157.  No prior  high-sensitivity troponin to compare this to.  Patient  denies ever having any chest pain but does mention an episode of left arm pain that prompted an EMS call yesterday for which he declined transport.  With patient's acute hypoxia on exertion, patient will need to be admitted for further workup to determine etiology and to determine if he has an oxygen demand.  He has never been on home oxygen.  He denies a history of sleep apnea though with body habitus would suspect this is an underlying contributor of his shortness of breath.  Cardiology was consulted due to elevated troponin and had no further recommendations but will see patient. Very remote history of asthma but no wheezing or abnormal lung sounds on exam though with intermittent hypoxia would keep on the differential. Low risk for PE per Wells criteria and though pt has been more sedentary recently with his chronic pain he is getting up and walking daily. No history of arrhythmia, DVT/PE, acute lower extremity pain or swelling. He was not tachycardic or tachypneic on exam and for much of ED stay remained with normal oxygen saturation on room air that only briefly dropped after administration of morphine. Regardless, with elevated troponin and acute hypoxia will admit to hospitalist for further workup and management. Aware that he will need close follow-up with an orthopedist once discharged for further management of his chronic osteoarthritis and degenerative changes in the back and hips.          Final Clinical Impression(s) / ED Diagnoses Final diagnoses:  Hypoxia  Other chronic pain  Primary osteoarthritis of right hip    Rx / DC Orders ED Discharge Orders     None         Suzzette Righter, PA-C 10/23/22 2032    Deno Etienne, DO 10/23/22 2059

## 2022-10-23 NOTE — ED Notes (Signed)
O2 at 2LPM [place] initiated after Morphine administration. O2 desat to 88% on RA. Alert and oriented x 4. O2 95-98% on the monitor.

## 2022-10-23 NOTE — ED Triage Notes (Signed)
Pt arrives via EMS with SOB for a month. O2 stayed around 93% RA for EMS. Pt reports left hand tingling yesterday and some chest tightness.

## 2022-10-23 NOTE — Progress Notes (Signed)
PHARMACIST - PHYSICIAN COMMUNICATION  CONCERNING:  Enoxaparin (Lovenox) for DVT Prophylaxis    RECOMMENDATION: Patient was prescribed enoxaprin '40mg'$  q24 hours for VTE prophylaxis.   Filed Weights   10/23/22 1023  Weight: 127 kg (279 lb 15.8 oz)    Body mass index is 37.97 kg/m.  Estimated Creatinine Clearance: 81 mL/min (A) (by C-G formula based on SCr of 1.27 mg/dL (H)).   Based on [place] patient is candidate for enoxaparin 0.'5mg'$ /kg TBW SQ every 24 hours based on BMI being >30.  DESCRIPTION: Pharmacy has adjusted enoxaparin dose per [facility] policy.  Patient is now receiving enoxaparin 60 mg every 24 hours    Berta Minor, PharmD Clinical Pharmacist  10/23/2022 8:35 PM

## 2022-10-23 NOTE — Consult Note (Signed)
Cardiology Consultation   Patient ID: Aaron Clarke MRN: [id number]; DOB: 04/20/1956  Admit date: 10/23/2022 Date of Consult: 10/23/2022  PCP:  Cletis Athens, [place] Providers Cardiologist: New to [facility]    Patient Profile:   Aaron Clarke is a 66 y.o. male with a hx of asthma, chronic back pain, HTN, CKD II, who is being seen 10/23/2022 for the evaluation of elevated troponin at the request of Dr. Tyrone Nine.  History of Present Illness:   Aaron Clarke presented to ER today c/o chronic pain of his legs, that is worsened over the past 7-8 month, now he is having difficulties with ambulation. He reports gait disturbance. He feels SOB due to severe pain. He had multiple ER visits for similar complaints. He was given morphine at ED, subsequently had pox dropping to 88%.   He states he had left first 3 digit hand pain since yesterday morning, pain lasted all day, severe 10/10. He never had left arm pain, chest pain. He had some left hand numbness. He states few month ago he had one episode of chest pain while doing exertional work , lasted 20 minutes and resolved spontaneously. He never mentioned it anyone. His fiance is at bedside, states he has frequent deep breathing pattern during sleep, last night was dropping to 76% where she uses her family member's oxygen for him. He seemed labored with breathing. He denied any weight gain, orthopnea, leg edema, of PND. His father had heart disease. He denied tobacco use, uses ETOH occasionally.   Admission diagnostic showed k 3.3, Cr 1.27, GFR >60. PLT 136k. Hs trop 157 x1. BNP 74. UA + Hgb and protein. CXR no acute finding. 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 Lumbar x-ray showed disc narring, see report. EKG showed SR 78 bpm, PVC, PACs, non-specific IVCD. Cardiology is consulted for elevated troponin.     Past Medical History:  Diagnosis Date   Asthma    Cholelithiases     Chronic kidney disease    Kidney stone 04/2011   Family history of breast cancer    mother    Past Surgical History:  Procedure Laterality Date   CHOLECYSTECTOMY  12/19/2011   Procedure: LAPAROSCOPIC CHOLECYSTECTOMY WITH INTRAOPERATIVE CHOLANGIOGRAM;  Surgeon: Adin Hector, MD;  Location: [facility];  Service: General;  Laterality: N/A;   COLONOSCOPY WITH PROPOFOL N/A 09/13/2022   Procedure: COLONOSCOPY WITH PROPOFOL;  Surgeon: Lin Landsman, MD;  Location: [facility];  Service: Gastroenterology;  Laterality: N/A;   Colonscopy     Fatty tumor  2003   L chest     Home Medications:  Prior to Admission medications   Medication Sig Start Date End Date Taking? Authorizing Provider  albuterol (VENTOLIN HFA) 108 (90 Base) MCG/ACT inhaler Inhale 1-2 puffs into the lungs every 6 (six) hours as needed for wheezing or shortness of breath. 08/20/22   Francene Finders, PA-C  gabapentin (NEURONTIN) 100 MG capsule Take 1 capsule (100 mg total) by mouth 2 (two) times daily. 10/11/22   Theresia Lo, NP  lidocaine 4 % Place 1 patch onto the skin daily. 48/18/56   Prince Rome, PA-C  losartan (COZAAR) 50 MG tablet Take 1 tablet (50 mg total) by mouth daily. 09/28/22   Theresia Lo, NP  meloxicam (MOBIC) 7.5 MG tablet Take 1 tablet (7.5 mg total) by mouth daily. 31/49/70   Prince Rome, PA-C    Inpatient Medications: Scheduled Meds:  diclofenac Sodium  2  g Topical QID   ofloxacin  5 drop Left EAR Daily   Continuous Infusions:  PRN Meds:   Allergies:   No Known Allergies  Social History:   Social History   Socioeconomic History   Marital status: Significant Other    Spouse name: Not on file   Number of children: Not on file   Years of education: Not on file   Highest education level: Not on file  Occupational History   Not on file  Tobacco Use   Smoking status: Former    Years: 14.00    Types: Cigarettes    Quit date: 06/19/1988    Years since quitting:  34.3   Smokeless tobacco: Never  Substance and Sexual Activity   Alcohol use: Yes    Comment: 2 -3 per day   Drug use: No   Sexual activity: Not on file  Other Topics Concern   Not on file  Social History Narrative   Not on file   Social Determinants of Health   Financial Resource Strain: Not on file  Food Insecurity: Not on file  Transportation Needs: Not on file  Physical Activity: Not on file  Stress: Not on file  Social Connections: Not on file  Intimate Partner Violence: Not on file    Family History:    Family History  Problem Relation Age of Onset   Cancer Mother        breast   Cancer Father        colon   Heart disease Father    Asthma Sister    Anesthesia problems Neg Hx      ROS:  Constitutional: Denied fever, chills, malaise, night sweats Eyes: Denied vision change or loss Ears/Nose/Mouth/Throat: Denied ear ache, sore throat, coughing, sinus pain Cardiovascular: denied chest pain/pressure Respiratory: denied shortness of breath Gastrointestinal: Denied nausea, vomiting, abdominal pain, diarrhea Genital/Urinary: Denied dysuria, hematuria, urinary frequency/urgency Musculoskeletal: see HPI  Skin: Denied rash, wound Neuro: Denied headache, dizziness, syncope Psych: Denied history of depression/anxiety  Endocrine: Denied history of diabetes   Physical Exam/Data:   Vitals:   10/23/22 1024 10/23/22 1432 10/23/22 1555 10/23/22 1821  BP: 133/80 (!) 150/72 (!) 160/67 (!) 151/83  Pulse: 78 84 71 75  Resp: '16 18 18 15  '$ Temp: 98 F (36.7 C)  98.3 F (36.8 C) 98 F (36.7 C)  TempSrc: Oral   Oral  SpO2: 93% 94% 99% 95%  Weight:      Height:       No intake or output data in the 24 hours ending 10/23/22 1921    10/23/2022   10:23 AM 10/01/2022    8:07 AM 09/13/2022    7:14 AM  Last 3 Weights  Weight (lbs) 279 lb 15.8 oz 280 lb 275 lb  Weight (kg) 127 kg 127.007 kg 124.739 kg     Body mass index is 37.97 kg/m.   Vitals:  Vitals:   10/23/22  1555 10/23/22 1821  BP: (!) 160/67 (!) 151/83  Pulse: 71 75  Resp: 18 15  Temp: 98.3 F (36.8 C) 98 F (36.7 C)  SpO2: 99% 95%   General Appearance: In no apparent distress, laying in bed, obese  HEENT: Normocephalic, atraumatic.  Neck: Supple, trachea midline, + JVD  Cardiovascular: Regular rate and rhythm, normal S1-S2,  no murmur Respiratory: Resting breathing unlabored, lungs sounds clear to auscultation bilaterally, no use of accessory muscles. On [place] oxygen.  No wheezes, rales or rhonchi.   Gastrointestinal: Bowel sounds positive,  abdomen soft Extremities: Able to move all extremities in bed without difficulty, no edema of BLE  Musculoskeletal: Normal muscle bulk and tone Skin: Intact, warm, dry. No rashes or petechiae noted in exposed areas.  Neurologic: Alert, oriented to person, place and time. Fluent speech,no cognitive deficit, no gross focal neuro deficit Psychiatric: Normal affect. Mood is appropriate.     EKG:  The EKG was personally reviewed and demonstrates:   EKG showed SR 78 bpm, PVC, PACs, non-specific IVCD.  Telemetry:  Telemetry was personally reviewed and demonstrates:  sinus rhythm, occasional PVCs  Relevant CV Studies:  No previous cardiac workup   Laboratory Data:  High Sensitivity Troponin:   Recent Labs  Lab 10/23/22 1715  TROPONINIHS 157*     Chemistry Recent Labs  Lab 10/23/22 1023  NA 140  K 3.3*  CL 108  CO2 26  GLUCOSE 121*  BUN 16  CREATININE 1.27*  CALCIUM 8.5*  GFRNONAA >60  ANIONGAP 6    No results for input(s): "PROT", "ALBUMIN", "AST", "ALT", "ALKPHOS", "BILITOT" in the last 168 hours. Lipids No results for input(s): "CHOL", "TRIG", "HDL", "LABVLDL", "LDLCALC", "CHOLHDL" in the last 168 hours.  Hematology Recent Labs  Lab 10/23/22 1023  WBC 5.3  RBC 5.49  HGB 16.3  HCT 47.5  MCV 86.5  MCH 29.7  MCHC 34.3  RDW 13.3  PLT 136*   Thyroid No results for input(s): "TSH", "FREET4" in the last 168 hours.  BNP Recent  Labs  Lab 10/23/22 1023  BNP 74.7    DDimer No results for input(s): "DDIMER" in the last 168 hours.   Radiology/Studies:  DG Hip Unilat W or Wo Pelvis 2-3 Views Right  Result Date: 10/23/2022 CLINICAL DATA:  Right hip pain radiating to the right lower back. No recent falls or trauma. EXAM: DG HIP (WITH OR WITHOUT PELVIS) 2-3V RIGHT COMPARISON:  Abdominal radiograph 04/24/2012 FINDINGS: Mildly decreased bone mineralization. Severe superolateral right femoroacetabular joint space narrowing and peripheral osteophytosis. Mild-to-moderate right femoral head-neck junction circumferential degenerative osteophytosis. Bilateral severe superior right femoroacetabular subchondral sclerosis. Moderate superolateral left femoroacetabular joint space narrowing and superolateral left acetabular and femoral head-neck junction degenerative osteophytosis. Mild bilateral sacroiliac subchondral sclerosis. Mild bilateral sacroiliac subchondral sclerosis. No acute fracture or dislocation. 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 Electronically Signed   By: Yvonne Kendall M.D.   On: 10/23/2022 11:10   DG Lumbar Spine Complete  Result Date: 10/23/2022 CLINICAL DATA:  Right-sided radiculopathy. EXAM: LUMBAR SPINE - COMPLETE 4+ VIEW COMPARISON:  Chest and abdominal radiographs 04/30/2012, chest two views 03/29/2022, CT abdomen and pelvis 05/23/2011 FINDINGS: Visualization of the inferior thoracic ribs on prior radiographs limits counting the number of rib-bearing thoracic type vertebral bodies, however overall on prior radiographs and CT there appear to be 11 rib-bearing thoracic type vertebral bodies with very small hypoplastic ribs at the T12 level. Distal to this, the next 5 vertebral bodies are considered L1 through L5. Mild dextrocurvature centered at L2. Moderate left L2-3 and mild left L1-2 and right T12-L1 disc space narrowing. Chronic appearing concave degenerative Schmorl's nodes  predominantly at the anterior aspect of the inferior L1 endplate, posterior aspect of the inferior L2 endplate, and superior greater than inferior L3 through L5 endplates. Large anterior T12-L1, L1-2, L3-4, and L5-S1 bridging osteophytes. Mild anterior L2-3, mild posterior L4-5, and moderate to severe L5-S1 endplate osteophytes. Right upper quadrant likely cholecystectomy clips. IMPRESSION: 1. Moderate left L2-3 and mild left L1-2 and right T12-L1 disc space narrowing. 2.  Large anterior T12-L1, L1-2, L3-4, and L5-S1 endplate osteophytes. 3. Mild dextrocurvature centered at L2. Electronically Signed   By: Yvonne Kendall M.D.   On: 10/23/2022 11:09   DG Chest 2 View  Result Date: 10/23/2022 CLINICAL DATA:  Right-sided radiculopathy.  Chest pain. EXAM: CHEST - 2 VIEW COMPARISON:  Chest two views 03/29/2022 and 07/06/2017 FINDINGS: Cardiac silhouette is at the upper limits of normal size. Mediastinal contours are within normal limits with mild calcification within the aortic arch. Mild chronic bilateral interstitial thickening within the bilateral lower lungs, similar to prior. No new acute airspace opacity is visualized. No pleural effusion or pneumothorax. Moderate multilevel disc space narrowing with large anterior level bridging degenerative osteophytes of the thoracic spine, similar to prior. Likely cholecystectomy clips are visualized on lateral view. IMPRESSION: 1. No acute cardiopulmonary process. 2. Mild chronic interstitial thickening within the bilateral lower lungs, similar to prior. This may represent mild interstitial scarring. Electronically Signed   By: Yvonne Kendall M.D.   On: 10/23/2022 11:01     Assessment and Plan:   Elevated troponin - never had chest pain, came for leg pain, had left hand pain yesterday  - Hs trop 145 x1, trend to peak - EKG no acute finding - Will check Echo given SOB - further workup recommendation pending cardiac enzymes trend  - check CK  Hypoxia - consider OSA  workup, rule out PE if indicated  Left hand pain - appears neuropathy related    Risk Assessment/Risk Scores:    For questions or updates, please contact [place] Please consult [email or web address] for contact info under    Signed, Margie Billet, NP  10/23/2022 7:21 PM

## 2022-10-23 NOTE — Progress Notes (Signed)
ANTICOAGULATION CONSULT NOTE - Initial Consult  Pharmacy Consult for Heparin Indication: pulmonary embolus  No Known Allergies  Patient Measurements: Height: 6' (182.9 cm) Weight: 127 kg (279 lb 15.8 oz) IBW/kg (Calculated) : 77.6 Heparin Dosing Weight: 105 kg  Vital Signs: Temp: 98 F (36.7 C) (12/04 2042) Temp Source: Oral (12/04 2042) BP: 150/80 (12/04 2042) Pulse Rate: 78 (12/04 2042)  Labs: Recent Labs    10/23/22 1023 10/23/22 1715 10/23/22 2006  HGB 16.3  --   --   HCT 47.5  --   --   PLT 136*  --   --   CREATININE 1.27*  --  1.15  CKTOTAL  --   --  160  TROPONINIHS  --  157*  --     Estimated Creatinine Clearance: 89.4 mL/min (by C-G formula based on SCr of 1.15 mg/dL).   Medical History: Past Medical History:  Diagnosis Date   Asthma    Cholelithiases    Chronic kidney disease    Kidney stone 04/2011   Family history of breast cancer    mother    Medications:  Current Facility-Administered Medications on File Prior to Encounter  Medication Dose Route Frequency Provider Last Rate Last Admin   diclofenac Sodium (VOLTAREN) 1 % topical gel 2 g  2 g Topical QID Cletis Athens, MD       ofloxacin (FLOXIN) 0.3 % OTIC (EAR) solution 5 drop  5 drop Left EAR Daily Cletis Athens, MD       Current Outpatient Medications on File Prior to Encounter  Medication Sig Dispense Refill   albuterol (VENTOLIN HFA) 108 (90 Base) MCG/ACT inhaler Inhale 1-2 puffs into the lungs every 6 (six) hours as needed for wheezing or shortness of breath. 8 g 0   gabapentin (NEURONTIN) 100 MG capsule Take 1 capsule (100 mg total) by mouth 2 (two) times daily. 90 capsule 3   lidocaine 4 % Place 1 patch onto the skin daily. 15 patch 0   losartan (COZAAR) 50 MG tablet Take 1 tablet (50 mg total) by mouth daily. 30 tablet 1   meloxicam (MOBIC) 7.5 MG tablet Take 1 tablet (7.5 mg total) by mouth daily. 14 tablet 0     Assessment: 66 y.o. male with bilateral PE for heparin Goal of  Therapy:  Heparin level 0.3-0.7 units/ml Monitor platelets by anticoagulation protocol: Yes   Plan:  Heparin 6000 units IV bolus, then start heparin 1850 units/hr Check heparin level in 8 hours.   Caryl Pina 10/23/2022,11:37 PM

## 2022-10-23 NOTE — Progress Notes (Addendum)
CTA chest returned positive for bilateral segmental and subsegmental pulmonary emboli.  The right ventricle is distended suggesting underlying right heart strain.    Heparin drip ordered with pharmacy consultation.  2D echo also ordered and is pending.  The patient will transfer to progressive care unit from telemetry medical.   Time: 15 minutes.

## 2022-10-23 NOTE — H&P (Addendum)
History and Physical  Aaron Clarke WUJ:[id number] DOB: May 31, 1956 DOA: 10/23/2022  Referring physician: Prescott Gum, PA-EDP. PCP: Cletis Athens, MD  Outpatient Specialists: GI, general surgery. Patient coming from: Home.  Chief Complaint: Shortness of breath and hypoxia.  HPI: Aaron Clarke is a 66 y.o. male with medical history significant for hypertension, asthma, who presented to [facility] ED from home due to shortness of breath and hypoxia with home oximeter in the 70s.  Additionally, he endorses having severe pain in his hips for more than 1 year that has progressed, and right leg pain for days.  He was recently given a diagnosis of osteoarthritis and was placed on gabapentin and Meloxicam.  In the ED, he was noted to be hypoxic with O2 saturation of 86% on room air.  EDP ordered troponin which resulted +157.  The patient denies chest pain however he admitted to have an episode of severe left arm pain yesterday for which EMS was activated.  He declined EMS transport to the hospital at that time.    Per his wife, last night he became hypoxic after ambulating to the bathroom with a home pulse ox in the 70s.    Due to elevated troponin, cardiology was consulted by EDP.  EDP requested admission for further evaluation and management of his symptomatology.  The patient was admitted by [facility] hospitalist service.  ED Course: Tmax 98.3.  BP 151/83, pulse 75, respiration rate 15, O2 saturation 95% on room air.  Lab studies remarkable for serum potassium 3.3, creatinine 1.27, GFR greater than 60.  High-sensitivity troponin 157.  CBC unremarkable except for platelet count 136.  Review of Systems: Review of systems as noted in the HPI. All other systems reviewed and are negative.   Past Medical History:  Diagnosis Date   Asthma    Cholelithiases    Chronic kidney disease    Kidney stone 04/2011   Family history of breast cancer    mother   Past Surgical History:  Procedure Laterality  Date   CHOLECYSTECTOMY  12/19/2011   Procedure: LAPAROSCOPIC CHOLECYSTECTOMY WITH INTRAOPERATIVE CHOLANGIOGRAM;  Surgeon: Adin Hector, MD;  Location: [place];  Service: General;  Laterality: N/A;   COLONOSCOPY WITH PROPOFOL N/A 09/13/2022   Procedure: COLONOSCOPY WITH PROPOFOL;  Surgeon: Lin Landsman, MD;  Location: [facility] ENDOSCOPY;  Service: Gastroenterology;  Laterality: N/A;   Colonscopy     Fatty tumor  2003   L chest    Social History:  reports that he quit smoking about 34 years ago. His smoking use included cigarettes. He has never used smokeless tobacco. He reports current alcohol use. He reports that he does not use drugs.   No Known Allergies  Family History  Problem Relation Age of Onset   Cancer Mother        breast   Cancer Father        colon   Heart disease Father    Asthma Sister    Anesthesia problems Neg Hx       Prior to Admission medications   Medication Sig Start Date End Date Taking? Authorizing Provider  albuterol (VENTOLIN HFA) 108 (90 Base) MCG/ACT inhaler Inhale 1-2 puffs into the lungs every 6 (six) hours as needed for wheezing or shortness of breath. 08/20/22   Francene Finders, PA-C  gabapentin (NEURONTIN) 100 MG capsule Take 1 capsule (100 mg total) by mouth 2 (two) times daily. 10/11/22   Theresia Lo, NP  lidocaine 4 % Place 1 patch onto the skin  daily. 95/28/41   Prince Rome, PA-C  losartan (COZAAR) 50 MG tablet Take 1 tablet (50 mg total) by mouth daily. 09/28/22   Theresia Lo, NP  meloxicam (MOBIC) 7.5 MG tablet Take 1 tablet (7.5 mg total) by mouth daily. 32/44/01   Prince Rome, PA-C    Physical Exam: BP (!) 151/83 (BP Location: Left Arm)   Pulse 75   Temp 98 F (36.7 C) (Oral)   Resp 15   Ht 6' (1.829 m)   Wt 127 kg   SpO2 95%   BMI 37.97 kg/m   General: 66 y.o. year-old male well developed well nourished in no acute distress.  Alert and oriented x3. Cardiovascular: Regular rate and rhythm with no  rubs or gallops.  No thyromegaly or JVD noted.  Trace lower extremity edema. 2/4 pulses in all 4 extremities. Respiratory: Mild wheezes bilaterally. Good inspiratory effort. Abdomen: Soft nontender nondistended with normal bowel sounds x4 quadrants. Muskuloskeletal: No cyanosis or clubbing.  Trace lower extremity edema bilaterally with tenderness with palplation RLE.  Neuro: CN II-XII intact, strength, sensation, reflexes Skin: No ulcerative lesions noted or rashes Psychiatry: Judgement and insight appear normal. Mood is appropriate for condition and setting          Labs on Admission:  Basic Metabolic Panel: Recent Labs  Lab 10/23/22 1023  NA 140  K 3.3*  CL 108  CO2 26  GLUCOSE 121*  BUN 16  CREATININE 1.27*  CALCIUM 8.5*   Liver Function Tests: No results for input(s): "AST", "ALT", "ALKPHOS", "BILITOT", "PROT", "ALBUMIN" in the last 168 hours. No results for input(s): "LIPASE", "AMYLASE" in the last 168 hours. No results for input(s): "AMMONIA" in the last 168 hours. CBC: Recent Labs  Lab 10/23/22 1023  WBC 5.3  NEUTROABS 3.5  HGB 16.3  HCT 47.5  MCV 86.5  PLT 136*   Cardiac Enzymes: No results for input(s): "CKTOTAL", "CKMB", "CKMBINDEX", "TROPONINI" in the last 168 hours.  BNP (last 3 results) Recent Labs    10/23/22 1023  BNP 74.7    ProBNP (last 3 results) No results for input(s): "PROBNP" in the last 8760 hours.  CBG: No results for input(s): "GLUCAP" in the last 168 hours.  Radiological Exams on Admission: DG Hip Unilat W or Wo Pelvis 2-3 Views Right  Result Date: 10/23/2022 CLINICAL DATA:  Right hip pain radiating to the right lower back. No recent falls or trauma. EXAM: DG HIP (WITH OR WITHOUT PELVIS) 2-3V RIGHT COMPARISON:  Abdominal radiograph 04/24/2012 FINDINGS: Mildly decreased bone mineralization. Severe superolateral right femoroacetabular joint space narrowing and peripheral osteophytosis. Mild-to-moderate right femoral head-neck junction  circumferential degenerative osteophytosis. Bilateral severe superior right femoroacetabular subchondral sclerosis. Moderate superolateral left femoroacetabular joint space narrowing and superolateral left acetabular and femoral head-neck junction degenerative osteophytosis. Mild bilateral sacroiliac subchondral sclerosis. Mild bilateral sacroiliac subchondral sclerosis. No acute fracture or dislocation. 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 Electronically Signed   By: Yvonne Kendall M.D.   On: 10/23/2022 11:10   DG Lumbar Spine Complete  Result Date: 10/23/2022 CLINICAL DATA:  Right-sided radiculopathy. EXAM: LUMBAR SPINE - COMPLETE 4+ VIEW COMPARISON:  Chest and abdominal radiographs 04/30/2012, chest two views 03/29/2022, CT abdomen and pelvis 05/23/2011 FINDINGS: Visualization of the inferior thoracic ribs on prior radiographs limits counting the number of rib-bearing thoracic type vertebral bodies, however overall on prior radiographs and CT there appear to be 11 rib-bearing thoracic type vertebral bodies with very small hypoplastic ribs at the T12 level.  Distal to this, the next 5 vertebral bodies are considered L1 through L5. Mild dextrocurvature centered at L2. Moderate left L2-3 and mild left L1-2 and right T12-L1 disc space narrowing. Chronic appearing concave degenerative Schmorl's nodes predominantly at the anterior aspect of the inferior L1 endplate, posterior aspect of the inferior L2 endplate, and superior greater than inferior L3 through L5 endplates. Large anterior T12-L1, L1-2, L3-4, and L5-S1 bridging osteophytes. Mild anterior L2-3, mild posterior L4-5, and moderate to severe L5-S1 endplate osteophytes. Right upper quadrant likely cholecystectomy clips. IMPRESSION: 1. Moderate left L2-3 and mild left L1-2 and right T12-L1 disc space narrowing. 2. Large anterior T12-L1, L1-2, L3-4, and L5-S1 endplate osteophytes. 3. Mild dextrocurvature centered at L2.  Electronically Signed   By: Yvonne Kendall M.D.   On: 10/23/2022 11:09   DG Chest 2 View  Result Date: 10/23/2022 CLINICAL DATA:  Right-sided radiculopathy.  Chest pain. EXAM: CHEST - 2 VIEW COMPARISON:  Chest two views 03/29/2022 and 07/06/2017 FINDINGS: Cardiac silhouette is at the upper limits of normal size. Mediastinal contours are within normal limits with mild calcification within the aortic arch. Mild chronic bilateral interstitial thickening within the bilateral lower lungs, similar to prior. No new acute airspace opacity is visualized. No pleural effusion or pneumothorax. Moderate multilevel disc space narrowing with large anterior level bridging degenerative osteophytes of the thoracic spine, similar to prior. Likely cholecystectomy clips are visualized on lateral view. IMPRESSION: 1. No acute cardiopulmonary process. 2. Mild chronic interstitial thickening within the bilateral lower lungs, similar to prior. This may represent mild interstitial scarring. Electronically Signed   By: Yvonne Kendall M.D.   On: 10/23/2022 11:01    EKG: I independently viewed the EKG done and my findings are as followed: Sinus rhythm rate of 78, with occasional PVCs.  Nonspecific ST-T changes.  QTc 469.  Assessment/Plan Present on Admission:  Acute hypoxic respiratory failure (HCC)  Principal Problem:   Acute hypoxic respiratory failure (HCC)  Acute hypoxic respiratory failure, secondary to acute asthma exacerbation, versus others History of asthma Chest x-ray nonacute Afebrile no leukocytosis Mild diffuse wheezing on exam- IV Solumedrol 40 mg daily ordered. Duonebs, bronchodilators Incentive spirometer Maintain O2 saturation greater than 92%.  Not on oxygen supplementation at baseline. Sedentary lifestyle for the past few months.  Rule out pulmonary embolism. Follow CT angio chest.  Elevated troponin, suspect demand ischemia in the setting of hypoxia First set of high-sensitivity troponin 157 No  evidence of acute ischemia on 12-lead EKG Denies any chest pain at the time of this visit. Trend troponin Monitor on telemetry Obtain complete 2D echo and fasting lipid panel.  Essential hypertension BP is elevated, not at goal Resume home losartan Monitor vital signs.  Chronic bilateral hip pain Endorses severe pain to the point where he cannot walk Bilateral hip pain revealed severe right and moderate left femoral acetabular osteoarthritis. On gabapentin and Mobic prior to admission Hold off Mobic in the setting of elevated troponin Continue home gabapentin The patient requested orthopedic surgery's consultation. Pain control and bowel regimen.  Ambulatory dysfunction in the setting of severe bilateral hip pain Orthopedic surgery/PT OT assessment Fall precautions   Critical care time: 65 minutes.   DVT prophylaxis: Subcu Lovenox daily  Code Status: Full code  Family Communication: Updated the patient's wife at bedside.  Disposition Plan: Admitted to telemetry medical unit  Consults called: Orthopedic surgery, Dr. Mardelle Matte via secure chat.  Admission status: Inpatient status.   Status is: Inpatient The patient requires at least 2  midnights for further evaluation and treatment of present condition.   Kayleen Memos MD [facility] Pager [phone]  If 7PM-7AM, please contact night-coverage [email or web address] Password [email or web address]  10/23/2022, 8:27 PM

## 2022-10-24 ENCOUNTER — Inpatient Hospital Stay (HOSPITAL_COMMUNITY): Payer: Self-pay

## 2022-10-24 ENCOUNTER — Other Ambulatory Visit (HOSPITAL_COMMUNITY): Payer: Self-pay

## 2022-10-24 DIAGNOSIS — R0602 Shortness of breath: Secondary | ICD-10-CM

## 2022-10-24 DIAGNOSIS — I82409 Acute embolism and thrombosis of unspecified deep veins of unspecified lower extremity: Secondary | ICD-10-CM

## 2022-10-24 DIAGNOSIS — I339 Acute and subacute endocarditis, unspecified: Secondary | ICD-10-CM

## 2022-10-24 DIAGNOSIS — I82403 Acute embolism and thrombosis of unspecified deep veins of lower extremity, bilateral: Secondary | ICD-10-CM

## 2022-10-24 DIAGNOSIS — R0609 Other forms of dyspnea: Secondary | ICD-10-CM

## 2022-10-24 DIAGNOSIS — I2699 Other pulmonary embolism without acute cor pulmonale: Secondary | ICD-10-CM

## 2022-10-24 DIAGNOSIS — R7989 Other specified abnormal findings of blood chemistry: Secondary | ICD-10-CM

## 2022-10-24 DIAGNOSIS — I2609 Other pulmonary embolism with acute cor pulmonale: Secondary | ICD-10-CM

## 2022-10-24 HISTORY — DX: Acute embolism and thrombosis of unspecified deep veins of unspecified lower extremity: I82.409

## 2022-10-24 HISTORY — DX: Acute embolism and thrombosis of unspecified deep veins of lower extremity, bilateral: I82.403

## 2022-10-24 LAB — COMPREHENSIVE METABOLIC PANEL
ALT: 18 U/L (ref 0–44)
AST: 21 U/L (ref 15–41)
Albumin: 3.4 g/dL — ABNORMAL LOW (ref 3.5–5.0)
Alkaline Phosphatase: 44 U/L (ref 38–126)
Anion gap: 8 (ref 5–15)
BUN: 16 mg/dL (ref 8–23)
CO2: 25 mmol/L (ref 22–32)
Calcium: 9.1 mg/dL (ref 8.9–10.3)
Chloride: 106 mmol/L (ref 98–111)
Creatinine, Ser: 1.14 mg/dL (ref 0.61–1.24)
GFR, Estimated: >60 mL/min (ref >60)
Glucose, Bld: 162 mg/dL — ABNORMAL HIGH (ref 70–99)
Potassium: 4.5 mmol/L (ref 3.5–5.1)
Sodium: 139 mmol/L (ref 135–145)
Total Bilirubin: 0.5 mg/dL (ref 0.3–1.2)
Total Protein: 6.3 g/dL — ABNORMAL LOW (ref 6.5–8.1)

## 2022-10-24 LAB — ECHOCARDIOGRAM COMPLETE
AR max vel: 3.41 cm2
AV Area VTI: 3.26 cm2
AV Area mean vel: 3.28 cm2
AV Mean grad: 6 mmHg
AV Peak grad: 10.2 mmHg
Ao pk vel: 1.6 m/s
Area-P 1/2: 2.99 cm2
Height: 72 in
Weight: 4479.75 oz

## 2022-10-24 LAB — LIPID PANEL
Cholesterol: 116 mg/dL (ref 0–200)
HDL: 36 mg/dL — ABNORMAL LOW (ref >40)
LDL Cholesterol: 74 mg/dL (ref 0–99)
Total CHOL/HDL Ratio: 3.2 RATIO
Triglycerides: 30 mg/dL (ref <150)
VLDL: 6 mg/dL (ref 0–40)

## 2022-10-24 LAB — TROPONIN I (HIGH SENSITIVITY)
Troponin I (High Sensitivity): 164 ng/L (ref <18)
Troponin I (High Sensitivity): 199 ng/L (ref <18)

## 2022-10-24 LAB — HIV ANTIBODY (ROUTINE TESTING W REFLEX): HIV Screen 4th Generation wRfx: NONREACTIVE

## 2022-10-24 LAB — MAGNESIUM: Magnesium: 1.9 mg/dL (ref 1.7–2.4)

## 2022-10-24 LAB — PHOSPHORUS: Phosphorus: 1.7 mg/dL — ABNORMAL LOW (ref 2.5–4.6)

## 2022-10-24 MED ORDER — APIXABAN 5 MG PO TABS
5.0000 mg | ORAL_TABLET | Freq: Two times a day (BID) | ORAL | 0 refills | Status: DC
  Filled 2022-10-24: qty 60, 30d supply, fill #0

## 2022-10-24 MED ORDER — K-PHOS 500 MG PO TABS
500.0000 mg | ORAL_TABLET | Freq: Three times a day (TID) | ORAL | 0 refills | Status: AC
  Filled 2022-10-24: qty 120, 30d supply, fill #0

## 2022-10-24 MED ORDER — APIXABAN 5 MG PO TABS: 5.0000 mg | ORAL_TABLET | Freq: Two times a day (BID) | ORAL | Status: DC

## 2022-10-24 MED ORDER — APIXABAN 5 MG PO TABS
10.0000 mg | ORAL_TABLET | Freq: Two times a day (BID) | ORAL | 0 refills | Status: DC
  Filled 2022-10-24: qty 60, 15d supply, fill #0

## 2022-10-24 MED ORDER — PERFLUTREN LIPID MICROSPHERE
1.0000 mL | INTRAVENOUS | Status: AC | PRN
  Administered 2022-10-24: 2 mL via INTRAVENOUS

## 2022-10-24 MED ORDER — APIXABAN 5 MG PO TABS
10.0000 mg | ORAL_TABLET | Freq: Two times a day (BID) | ORAL | Status: DC
  Administered 2022-10-24: 10 mg via ORAL
  Filled 2022-10-24 (×2): qty 2

## 2022-10-24 MED ORDER — SODIUM PHOSPHATES 45 MMOLE/15ML IV SOLN
20.0000 mmol | Freq: Once | INTRAVENOUS | Status: DC
  Filled 2022-10-24: qty 6.67

## 2022-10-24 MED ORDER — APIXABAN (ELIQUIS) VTE STARTER PACK (10MG AND 5MG)
ORAL_TABLET | ORAL | 0 refills | Status: DC
  Filled 2022-10-24: qty 74, 30d supply, fill #0

## 2022-10-24 NOTE — Progress Notes (Signed)
BLE venous duplex has been completed.  Preliminary findings given to Dr. Jamse Arn.   Results can be found under chart review under CV PROC. 10/24/2022 11:19 AM Keeara Frees RVT, RDMS

## 2022-10-24 NOTE — Progress Notes (Incomplete)
Echocardiogram 2D Echocardiogram has been performed.  Aaron Clarke 10/24/2022, 9:23 AM

## 2022-10-24 NOTE — Discharge Instructions (Addendum)
Information on my medicine - ELIQUIS (apixaban)  Why was Eliquis prescribed for you? Eliquis was prescribed to treat blood clots that may have been found in the veins of your legs (deep vein thrombosis) or in your lungs (pulmonary embolism) and to reduce the risk of them occurring again.  What do You need to know about Eliquis ? The starting dose is 10 mg (two 5 mg tablets) taken TWICE daily for the FIRST SEVEN (7) DAYS, then on 10/31/22  the dose is reduced to ONE 5 mg tablet taken TWICE daily.  Eliquis may be taken with or without food.   Try to take the dose about the same time in the morning and in the evening. If you have difficulty swallowing the tablet whole please discuss with your pharmacist how to take the medication safely.  Take Eliquis exactly as prescribed and DO NOT stop taking Eliquis without talking to the doctor who prescribed the medication.  Stopping may increase your risk of developing a new blood clot.  Refill your prescription before you run out.  After discharge, you should have regular check-up appointments with your healthcare provider that is prescribing your Eliquis.    What do you do if you miss a dose? If a dose of ELIQUIS is not taken at the scheduled time, take it as soon as possible on the same day and twice-daily administration should be resumed. The dose should not be doubled to make up for a missed dose.  Important Safety Information A possible side effect of Eliquis is bleeding. You should call your healthcare provider right away if you experience any of the following: Bleeding from an injury or your nose that does not stop. Unusual colored urine (red or dark brown) or unusual colored stools (red or black). Unusual bruising for unknown reasons. A serious fall or if you hit your head (even if there is no bleeding).  Some medicines may interact with Eliquis and might increase your risk of bleeding or clotting while on Eliquis. To help avoid this,  consult your healthcare provider or pharmacist prior to using any new prescription or non-prescription medications, including herbals, vitamins, non-steroidal anti-inflammatory drugs (NSAIDs) and supplements.  This website has more information on Eliquis (apixaban): http://www.eliquis.com/eliquis/home

## 2022-10-24 NOTE — ED Notes (Signed)
Echo at bedside

## 2022-10-24 NOTE — ED Notes (Signed)
Dtr picking up px from [place].

## 2022-10-24 NOTE — Discharge Summary (Signed)
Aaron Clarke IOM:[id number] DOB: Jan 25, 1956 DOA: 10/23/2022  PCP: Cletis Athens, MD  Admit date: 10/23/2022  Discharge date: 10/24/2022  Admitted From: Home   disposition: Home   Recommendations for Outpatient Follow-up:   Follow up with PCP in 1-2 weeks for ongoing Eliquis prescription  Home Health: N/A Equipment/Devices: N/A Consultations: Cardiology Discharge Condition: Improved CODE STATUS: Full Diet Recommendation: Heart Healthy carb modified  Diet Order             Diet - low sodium heart healthy           Diet heart healthy/carb modified Room service appropriate? Yes; Fluid consistency: Thin  Diet effective now                    Chief Complaint  Patient presents with   Shortness of Breath     Brief history of present illness from the day of admission and additional interim summary     Aaron Clarke is a 66 y.o. male with medical history significant for hypertension, asthma, who presented to [facility] ED from home due to shortness of breath and hypoxia with home oximeter in the 70s.  Additionally, he endorses having severe pain in his hips for more than 1 year that has progressed, and right leg pain for days.  He was recently given a diagnosis of osteoarthritis and was placed on gabapentin and Meloxicam.   In the ED, he was noted to be hypoxic with O2 saturation of 86% on room air.  EDP ordered troponin which resulted +157.  The patient denies chest pain however he admitted to have an episode of severe left arm pain yesterday for which EMS was activated.  He declined EMS transport to the hospital at that time.     Per his wife, last night he became hypoxic after ambulating to the bathroom with a home pulse ox in the 70s.    Workup in the ED revealed bilateral pulmonary emboli and elevated  troponin to 157.                                                                  Hospital Course    Patient was started on IV heparin drip for bilateral PE.  He was also seen by cardiology who felt that elevated troponin was likely secondary to PE and did not represent any ACS.  No further workup was recommended from a cardiology standpoint.  Echocardiogram was completed which showed normal LV and RV function without any RV strain.  Patient was tapered off oxygen and was tolerating room air without difficulty.  Patient was was transitioned off heparin and onto DOAC and discharged home on Eliquis to follow-up with his PCP for ongoing monitoring of anticoagulation and treatment of any bilateral PE.   Discharge diagnosis  Principal Problem:   Acute hypoxic respiratory failure (HCC) Active Problems:   Dyspnea on exertion   Elevated troponin   Pulmonary embolus [facility])    Discharge instructions    Discharge Instructions     Diet - low sodium heart healthy   Complete by: As directed    Discharge instructions   Complete by: As directed    1) You need to see your PCP in 7-10 days to check how you are doing. Also, your PCP will need to give you follow-up prescriptions for Eliquis since you can only get 1 month supply from here--but you will need to be on it for at least 3-6 months.  Do not use any NSAIDs or aspirin and that includes ibuprofen, naproxen, Celebrex or meloxicam while you are on the Eliquis due to increased risk of bleeding. 2) Your Phostate level is low--I am putting you on oral phosphate supplement, this will help with strength in your muscles. Your PCP can monitor your Potassium Phosphate levels at your next visit and tell you know long you need to stay on it.   Increase activity slowly   Complete by: As directed        Discharge Medications   Allergies as of 10/24/2022   No Known Allergies      Medication List     STOP taking these medications    meloxicam  7.5 MG tablet Commonly known as: MOBIC       TAKE these medications    acetaminophen 500 MG tablet Commonly known as: TYLENOL Take 500 mg by mouth every 6 (six) hours as needed for mild pain or moderate pain.   albuterol 108 (90 Base) MCG/ACT inhaler Commonly known as: VENTOLIN HFA Inhale 1-2 puffs into the lungs every 6 (six) hours as needed for wheezing or shortness of breath.   Eliquis DVT/PE Starter Pack Generic drug: Apixaban Starter Pack ('10mg'$  and '5mg'$ ) Take as directed on package: start with two-'5mg'$  tablets twice daily for 7 days. On day 8, switch to one-'5mg'$  tablet twice daily.   gabapentin 100 MG capsule Commonly known as: NEURONTIN Take 1 capsule (100 mg total) by mouth 2 (two) times daily.   K-Phos 500 MG tablet Generic drug: potassium phosphate (monobasic) Take 1 tablet (500 mg total) by mouth 4 (four) times daily -  with meals and at bedtime.   lidocaine 4 % Place 1 patch onto the skin daily.   losartan 50 MG tablet Commonly known as: COZAAR Take 1 tablet (50 mg total) by mouth daily.   PROBIOTIC DIGESTIVE SUPPORT PO Take 1 tablet by mouth daily.   VITAMIN B-12 PO Take 1 tablet by mouth daily.               Durable Medical Equipment  (From admission, onward)           Start     Ordered   10/24/22 1057  For home use only DME oxygen  Once       Question Answer Comment  Length of Need 6 Months   Mode or (Route) Nasal cannula   Liters per Minute 3   Oxygen delivery system Gas      10/24/22 1056              Major procedures and Radiology Reports - PLEASE review detailed and final reports thoroughly  -      VAS [place] LOWER EXTREMITY VENOUS (DVT)  Result Date: 10/24/2022  Lower Venous DVT Study Patient Name:  Aaron Clarke  Date of Exam:   10/24/2022 Medical Rec #: [id number]           Accession #:    [id number] Date of Birth: 06/09/1956          Patient Gender: M Patient Age:   66 years Exam Location:  [facility] Procedure:       VAS [place] LOWER EXTREMITY VENOUS (DVT) Referring Phys: Archie Patten HALL --------------------------------------------------------------------------------  Indications: SOB, and pulmonary embolism.  Limitations: Poor ultrasound/tissue interface. Comparison Study: No previous exams Performing Technologist: Jody Hill RVT, RDMS  Examination Guidelines: A complete evaluation includes B-mode imaging, spectral Doppler, color Doppler, and power Doppler as needed of all accessible portions of each vessel. Bilateral testing is considered an integral part of a complete examination. Limited examinations for reoccurring indications may be performed as noted. The reflux portion of the exam is performed with the patient in reverse Trendelenburg.  +---------+---------------+---------+-----------+----------+--------------+ RIGHT    CompressibilityPhasicitySpontaneityPropertiesThrombus Aging +---------+---------------+---------+-----------+----------+--------------+ CFV      Full           Yes      Yes                                 +---------+---------------+---------+-----------+----------+--------------+ SFJ      Full                                                        +---------+---------------+---------+-----------+----------+--------------+ FV Prox  Full           Yes      Yes                                 +---------+---------------+---------+-----------+----------+--------------+ FV Mid   Full           Yes      Yes                                 +---------+---------------+---------+-----------+----------+--------------+ FV DistalFull           Yes      Yes                                 +---------+---------------+---------+-----------+----------+--------------+ PFV      Full                                                        +---------+---------------+---------+-----------+----------+--------------+ POP      Full           Yes      Yes                                  +---------+---------------+---------+-----------+----------+--------------+ PTV      Partial        No       No  Acute          +---------+---------------+---------+-----------+----------+--------------+ PERO     Full                                                        +---------+---------------+---------+-----------+----------+--------------+   +---------+---------------+---------+-----------+----------+--------------+ LEFT     CompressibilityPhasicitySpontaneityPropertiesThrombus Aging +---------+---------------+---------+-----------+----------+--------------+ CFV      Full           Yes      Yes                                 +---------+---------------+---------+-----------+----------+--------------+ SFJ      Full                                                        +---------+---------------+---------+-----------+----------+--------------+ FV Prox  Full           Yes      Yes                                 +---------+---------------+---------+-----------+----------+--------------+ FV Mid   Full           Yes      Yes                                 +---------+---------------+---------+-----------+----------+--------------+ FV DistalFull           Yes      Yes                                 +---------+---------------+---------+-----------+----------+--------------+ PFV      Full                                                        +---------+---------------+---------+-----------+----------+--------------+ POP      Full           Yes      Yes                                 +---------+---------------+---------+-----------+----------+--------------+ PTV      Full                                                        +---------+---------------+---------+-----------+----------+--------------+ PERO     Full                                                         +---------+---------------+---------+-----------+----------+--------------+  Gastroc  None           No       No                   Acute          +---------+---------------+---------+-----------+----------+--------------+    Summary: BILATERAL: -No evidence of popliteal cyst, bilaterally. RIGHT: - Findings consistent with acute deep vein thrombosis involving the right posterior tibial veins.  LEFT: - Findings consistent with acute deep vein thrombosis involving the left gastrocnemius veins.  *See table(s) above for measurements and observations.    Preliminary    ECHOCARDIOGRAM COMPLETE  Result Date: 10/24/2022    ECHOCARDIOGRAM REPORT   Patient Name:   Aaron Clarke Date of Exam: 10/24/2022 Medical Rec #:  [id number]          Height:       72.0 in Accession #:    [id number]         Weight:       280.0 lb Date of Birth:  June 26, 1956         BSA:          2.458 m Patient Age:    42 years           BP:           155/89 mmHg Patient Gender: M                  HR:           74 bpm. Exam Location:  Inpatient Procedure: 2D Echo, Cardiac Doppler, Color Doppler and Intracardiac            Opacification Agent Indications:    SBE I33.9  History:        Patient has no prior history of Echocardiogram examinations.                 Signs/Symptoms:Shortness of Breath; Risk Factors:Hypertension.                 Family history of Breast Cancer.  Sonographer:    Ronny Flurry Referring Phys: [id number] Margie Billet IMPRESSIONS  1. Left ventricular ejection fraction, by estimation, is 55 to 60%. The left ventricle has normal function. The left ventricle has no regional wall motion abnormalities. Left ventricular diastolic parameters are indeterminate.  2. Right ventricular systolic function is normal. The right ventricular size is normal. Tricuspid regurgitation signal is inadequate for assessing PA pressure.  3. The mitral valve is grossly normal. Trivial mitral valve regurgitation. No evidence of mitral stenosis.  4. The  aortic valve is tricuspid. Aortic valve regurgitation is not visualized. No aortic stenosis is present.  5. The inferior vena cava is normal in size with greater than 50% respiratory variability, suggesting right atrial pressure of 3 mmHg. FINDINGS  Left Ventricle: Left ventricular ejection fraction, by estimation, is 55 to 60%. The left ventricle has normal function. The left ventricle has no regional wall motion abnormalities. Definity contrast agent was given IV to delineate the left ventricular  endocardial borders. The left ventricular internal cavity size was normal in size. There is no left ventricular hypertrophy. Left ventricular diastolic parameters are indeterminate. Right Ventricle: The right ventricular size is normal. No increase in right ventricular wall thickness. Right ventricular systolic function is normal. Tricuspid regurgitation signal is inadequate for assessing PA pressure. Left Atrium: Left atrial size was normal in size. Right Atrium: Right atrial size was normal in size. Pericardium: There is no evidence  of pericardial effusion. Mitral Valve: The mitral valve is grossly normal. Trivial mitral valve regurgitation. No evidence of mitral valve stenosis. Tricuspid Valve: The tricuspid valve is grossly normal. Tricuspid valve regurgitation is trivial. No evidence of tricuspid stenosis. Aortic Valve: The aortic valve is tricuspid. Aortic valve regurgitation is not visualized. No aortic stenosis is present. Aortic valve mean gradient measures 6.0 mmHg. Aortic valve peak gradient measures 10.2 mmHg. Aortic valve area, by VTI measures 3.26  cm. Pulmonic Valve: The pulmonic valve was grossly normal. Pulmonic valve regurgitation is not visualized. No evidence of pulmonic stenosis. Aorta: The aortic root is normal in size and structure. Venous: The inferior vena cava is normal in size with greater than 50% respiratory variability, suggesting right atrial pressure of 3 mmHg. IAS/Shunts: The atrial  septum is grossly normal.  LEFT VENTRICLE PLAX 2D LVOT diam:     2.40 cm   Diastology LV SV:         108       LV e' medial:    6.74 cm/s LV SV Index:   44        LV E/e' medial:  17.4 LVOT Area:     4.52 cm  LV e' lateral:   10.70 cm/s                          LV E/e' lateral: 10.9  RIGHT VENTRICLE RV S prime:     9.90 cm/s TAPSE (M-mode): 2.7 cm LEFT ATRIUM             Index        RIGHT ATRIUM           Index LA Vol (A2C):   83.1 ml 33.81 ml/m  RA Area:     16.80 cm LA Vol (A4C):   59.8 ml 24.33 ml/m  RA Volume:   42.40 ml  17.25 ml/m LA Biplane Vol: 72.9 ml 29.66 ml/m  AORTIC VALVE AV Area (Vmax):    3.41 cm AV Area (Vmean):   3.28 cm AV Area (VTI):     3.26 cm AV Vmax:           160.00 cm/s AV Vmean:          112.000 cm/s AV VTI:            0.332 m AV Peak Grad:      10.2 mmHg AV Mean Grad:      6.0 mmHg LVOT Vmax:         120.55 cm/s LVOT Vmean:        81.200 cm/s LVOT VTI:          0.239 m LVOT/AV VTI ratio: 0.72  AORTA Ao Root diam: 3.60 cm MITRAL VALVE MV Area (PHT): 2.99 cm     SHUNTS MV Decel Time: 254 msec     Systemic VTI:  0.24 m MV E velocity: 117.00 cm/s  Systemic Diam: 2.40 cm MV A velocity: 112.00 cm/s MV E/A ratio:  1.04 Aaron Chiquito MD Electronically signed by Aaron Chiquito MD Signature Date/Time: 10/24/2022/10:08:51 AM    Final    CT Angio Chest Pulmonary Embolism (PE) W or WO Contrast  Addendum Date: 10/23/2022   ADDENDUM REPORT: 10/23/2022 23:32 ADDENDUM: Critical Value/emergent results were called by telephone at the time of interpretation on 10/23/2022 at 11:31 pm to patient's nurse Bonna Gains, who verbally acknowledged these results. Electronically Signed   By: Brett Fairy M.D.   On:  10/23/2022 23:32   Result Date: 10/23/2022 CLINICAL DATA:  Chronic dyspnea, chest wall or pleural disease suspected. Shortness of breath for 1 month. EXAM: CT ANGIOGRAPHY CHEST WITH CONTRAST TECHNIQUE: Multidetector CT imaging of the chest was performed using the standard protocol during bolus  administration of intravenous contrast. Multiplanar CT image reconstructions and MIPs were obtained to evaluate the vascular anatomy. RADIATION DOSE REDUCTION: This exam was performed according to the departmental dose-optimization program which includes automated exposure control, adjustment of the mA and/or kV according to patient size and/or use of iterative reconstruction technique. CONTRAST:  34m OMNIPAQUE IOHEXOL 350 MG/ML SOLN COMPARISON:  None Available. FINDINGS: Cardiovascular: The heart is enlarged and there is no pericardial effusion. A few scattered coronary artery calcifications are noted. The aorta and pulmonary trunk are normal in caliber. There are segmental and subsegmental pulmonary artery filling defects in left upper lobe. Segmental pulmonary artery filling defects are noted in the left lower lobe. Segmental and subsegmental pulmonary artery filling defects are noted in the right upper and lower lobes. The right ventricle is distended suggesting underlying right heart strain. Mediastinum/Nodes: No enlarged mediastinal, hilar, or axillary lymph nodes. Calcified lymph nodes are present at the left hilum. Thyroid gland, trachea, and esophagus demonstrate no significant findings. Lungs/Pleura: Mild atelectasis bilaterally. No effusion or pneumothorax. Calcified granuloma is present in the left lower lobe. Upper Abdomen: The gallbladder is surgically absent. Multiple calcified granuloma in the spleen. No acute abnormality. Musculoskeletal: Gynecomastia is present bilaterally. Degenerative changes are present in the thoracic spine. No acute osseous abnormality. Review of the MIP images confirms the above findings. IMPRESSION: 1. Bilateral segmental and subsegmental pulmonary emboli. The right ventricle is distended suggesting underlying right heart strain. 2. Evidence of prior granulomatous disease. 3. Cardiomegaly. Electronically Signed: By: LBrett FairyM.D. On: 10/23/2022 23:24   DG Hip Unilat  W or Wo Pelvis 2-3 Views Right  Result Date: 10/23/2022 CLINICAL DATA:  Right hip pain radiating to the right lower back. No recent falls or trauma. EXAM: DG HIP (WITH OR WITHOUT PELVIS) 2-3V RIGHT COMPARISON:  Abdominal radiograph 04/24/2012 FINDINGS: Mildly decreased bone mineralization. Severe superolateral right femoroacetabular joint space narrowing and peripheral osteophytosis. Mild-to-moderate right femoral head-neck junction circumferential degenerative osteophytosis. Bilateral severe superior right femoroacetabular subchondral sclerosis. Moderate superolateral left femoroacetabular joint space narrowing and superolateral left acetabular and femoral head-neck junction degenerative osteophytosis. Mild bilateral sacroiliac subchondral sclerosis. Mild bilateral sacroiliac subchondral sclerosis. No acute fracture or dislocation. 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 Electronically Signed   By: RYvonne KendallM.D.   On: 10/23/2022 11:10   DG Lumbar Spine Complete  Result Date: 10/23/2022 CLINICAL DATA:  Right-sided radiculopathy. EXAM: LUMBAR SPINE - COMPLETE 4+ VIEW COMPARISON:  Chest and abdominal radiographs 04/30/2012, chest two views 03/29/2022, CT abdomen and pelvis 05/23/2011 FINDINGS: Visualization of the inferior thoracic ribs on prior radiographs limits counting the number of rib-bearing thoracic type vertebral bodies, however overall on prior radiographs and CT there appear to be 11 rib-bearing thoracic type vertebral bodies with very small hypoplastic ribs at the T12 level. Distal to this, the next 5 vertebral bodies are considered L1 through L5. Mild dextrocurvature centered at L2. Moderate left L2-3 and mild left L1-2 and right T12-L1 disc space narrowing. Chronic appearing concave degenerative Schmorl's nodes predominantly at the anterior aspect of the inferior L1 endplate, posterior aspect of the inferior L2 endplate, and superior greater than inferior L3  through L5 endplates. Large anterior T12-L1, L1-2, L3-4, and L5-S1 bridging osteophytes.  Mild anterior L2-3, mild posterior L4-5, and moderate to severe L5-S1 endplate osteophytes. Right upper quadrant likely cholecystectomy clips. IMPRESSION: 1. Moderate left L2-3 and mild left L1-2 and right T12-L1 disc space narrowing. 2. Large anterior T12-L1, L1-2, L3-4, and L5-S1 endplate osteophytes. 3. Mild dextrocurvature centered at L2. Electronically Signed   By: Yvonne Kendall M.D.   On: 10/23/2022 11:09   DG Chest 2 View  Result Date: 10/23/2022 CLINICAL DATA:  Right-sided radiculopathy.  Chest pain. EXAM: CHEST - 2 VIEW COMPARISON:  Chest two views 03/29/2022 and 07/06/2017 FINDINGS: Cardiac silhouette is at the upper limits of normal size. Mediastinal contours are within normal limits with mild calcification within the aortic arch. Mild chronic bilateral interstitial thickening within the bilateral lower lungs, similar to prior. No new acute airspace opacity is visualized. No pleural effusion or pneumothorax. Moderate multilevel disc space narrowing with large anterior level bridging degenerative osteophytes of the thoracic spine, similar to prior. Likely cholecystectomy clips are visualized on lateral view. IMPRESSION: 1. No acute cardiopulmonary process. 2. Mild chronic interstitial thickening within the bilateral lower lungs, similar to prior. This may represent mild interstitial scarring. Electronically Signed   By: Yvonne Kendall M.D.   On: 10/23/2022 11:01    Micro Results    No results found for this or any previous visit (from the past 240 hour(s)).  Today   Subjective    Aaron Clarke feels much improved since admission.  Feels ready to go home.  Denies chest pain, shortness of breath or abdominal pain.  Feels they can take care of themselves with the resources they have at home.  Objective   Blood pressure 128/79, pulse 82, temperature 98.1 F (36.7 C), temperature source Oral, resp.  rate 20, height 6' (1.829 m), weight 127 kg, SpO2 94 %.  No intake or output data in the 24 hours ending 10/24/22 1447  Exam General: Patient appears well and in good spirits sitting up in bed in no acute distress.  Eyes: sclera anicteric, conjuctiva mild injection bilaterally, significant rhinophyma CVS: S1-S2, regular  Respiratory:  decreased air entry bilaterally secondary to decreased inspiratory effort, rales at bases  GI: NABS, soft, NT  LE: No edema.  Neuro: A/O x 3,  grossly nonfocal.     Data Review   CBC w Diff:  Lab Results  Component Value Date   WBC 5.3 10/23/2022   HGB 16.3 10/23/2022   HCT 47.5 10/23/2022   PLT 136 (L) 10/23/2022   LYMPHOPCT 18 10/23/2022   MONOPCT 8 10/23/2022   EOSPCT 8 10/23/2022   BASOPCT 1 10/23/2022    CMP:  Lab Results  Component Value Date   NA 139 10/24/2022   K 4.5 10/24/2022   CL 106 10/24/2022   CO2 25 10/24/2022   BUN 16 10/24/2022   CREATININE 1.14 10/24/2022   PROT 6.3 (L) 10/24/2022   ALBUMIN 3.4 (L) 10/24/2022   BILITOT 0.5 10/24/2022   ALKPHOS 44 10/24/2022   AST 21 10/24/2022   ALT 18 10/24/2022  .   Total Time in preparing paper work, data evaluation and todays exam - 35 minutes  Aaron Clarke M.D on 10/24/2022 at 2:47 PM  [facility]

## 2022-10-24 NOTE — Discharge Planning (Signed)
RNCM consulted regarding home oxygen equipment.  Pt will need to qualify for home oxygen by meeting saturation qualification parameters.

## 2022-10-24 NOTE — Progress Notes (Signed)
Rounding Note    Patient Name: Aaron Clarke Date of Encounter: 10/24/2022  [place] Cardiologist: None   Subjective   Patient resting comfortably in bed. He denies chest pain this morning. Mild dyspnea but stable on O2 at 2LPM via [place]. Discussed plans for echocardiogram and lower extremity dopplers. Patient has significant orthopedic pain that has recently resulted in a sedentary lifestyle. No clear symptoms of cardiac chest pain in recent timeframe prior to admission. Patient's spouse is at bedside and confirms nocturnal symptoms of apnea and loud snoring.  Inpatient Medications    Scheduled Meds:  diclofenac Sodium  2 g Topical QID   gabapentin  100 mg Oral BID   losartan  50 mg Oral Daily   methylPREDNISolone (SOLU-MEDROL) injection  40 mg Intravenous Daily   ofloxacin  5 drop Left EAR Daily   pantoprazole  40 mg Oral Daily   senna-docusate  1 tablet Oral QHS   Continuous Infusions:  heparin 1,850 Units/hr (10/23/22 2358)   PRN Meds: acetaminophen, HYDROmorphone (DILAUDID) injection, melatonin, oxyCODONE-acetaminophen, polyethylene glycol, prochlorperazine   Vital Signs    Vitals:   10/24/22 0437 10/24/22 0500 10/24/22 0545 10/24/22 0630  BP:  138/84 (!) 155/89   Pulse:  72 81 76  Resp:  13 (!) 21 17  Temp: 98.9 F (37.2 C)     TempSrc: Oral     SpO2:  94% 97% 94%  Weight:      Height:       No intake or output data in the 24 hours ending 10/24/22 0719    10/23/2022   10:23 AM 10/01/2022    8:07 AM 09/13/2022    7:14 AM  Last 3 Weights  Weight (lbs) 279 lb 15.8 oz 280 lb 275 lb  Weight (kg) 127 kg 127.007 kg 124.739 kg      Telemetry    Sinus rhythm - Personally Reviewed  ECG    No new tracing  Physical Exam   GEN: No acute distress.   Neck: No JVD Cardiac: RRR, no murmurs, rubs, or gallops.  Respiratory: Quiet bilateral upper lobe expiratory wheezing GI: Soft, nontender, non-distended  MS: No edema; No deformity. Neuro:   Nonfocal  Psych: Normal affect   Labs    High Sensitivity Troponin:   Recent Labs  Lab 10/23/22 1715 10/23/22 2340 10/24/22 0344  TROPONINIHS 157* 199* 164*     Chemistry Recent Labs  Lab 10/23/22 1023 10/23/22 2006 10/24/22 0347  NA 140  --  139  K 3.3*  --  4.5  CL 108  --  106  CO2 26  --  25  GLUCOSE 121*  --  162*  BUN 16  --  16  CREATININE 1.27* 1.15 1.14  CALCIUM 8.5*  --  9.1  MG  --   --  1.9  PROT  --   --  6.3*  ALBUMIN  --   --  3.4*  AST  --   --  21  ALT  --   --  18  ALKPHOS  --   --  44  BILITOT  --   --  0.5  GFRNONAA >60 >60 >60  ANIONGAP 6  --  8    Lipids  Recent Labs  Lab 10/24/22 0347  CHOL 116  TRIG 30  HDL 36*  LDLCALC 74  CHOLHDL 3.2    Hematology Recent Labs  Lab 10/23/22 1023  WBC 5.3  RBC 5.49  HGB 16.3  HCT 47.5  MCV 86.5  MCH 29.7  MCHC 34.3  RDW 13.3  PLT 136*   Thyroid No results for input(s): "TSH", "FREET4" in the last 168 hours.  BNP Recent Labs  Lab 10/23/22 1023  BNP 74.7    DDimer No results for input(s): "DDIMER" in the last 168 hours.   Radiology    CT Angio Chest Pulmonary Embolism (PE) W or WO Contrast  Addendum Date: 10/23/2022   ADDENDUM REPORT: 10/23/2022 23:32 ADDENDUM: Critical Value/emergent results were called by telephone at the time of interpretation on 10/23/2022 at 11:31 pm to patient's nurse Bonna Gains, who verbally acknowledged these results. Electronically Signed   By: Brett Fairy M.D.   On: 10/23/2022 23:32   Result Date: 10/23/2022 CLINICAL DATA:  Chronic dyspnea, chest wall or pleural disease suspected. Shortness of breath for 1 month. EXAM: CT ANGIOGRAPHY CHEST WITH CONTRAST TECHNIQUE: Multidetector CT imaging of the chest was performed using the standard protocol during bolus administration of intravenous contrast. Multiplanar CT image reconstructions and MIPs were obtained to evaluate the vascular anatomy. RADIATION DOSE REDUCTION: This exam was performed according to the  departmental dose-optimization program which includes automated exposure control, adjustment of the mA and/or kV according to patient size and/or use of iterative reconstruction technique. CONTRAST:  42m OMNIPAQUE IOHEXOL 350 MG/ML SOLN COMPARISON:  None Available. FINDINGS: Cardiovascular: The heart is enlarged and there is no pericardial effusion. A few scattered coronary artery calcifications are noted. The aorta and pulmonary trunk are normal in caliber. There are segmental and subsegmental pulmonary artery filling defects in left upper lobe. Segmental pulmonary artery filling defects are noted in the left lower lobe. Segmental and subsegmental pulmonary artery filling defects are noted in the right upper and lower lobes. The right ventricle is distended suggesting underlying right heart strain. Mediastinum/Nodes: No enlarged mediastinal, hilar, or axillary lymph nodes. Calcified lymph nodes are present at the left hilum. Thyroid gland, trachea, and esophagus demonstrate no significant findings. Lungs/Pleura: Mild atelectasis bilaterally. No effusion or pneumothorax. Calcified granuloma is present in the left lower lobe. Upper Abdomen: The gallbladder is surgically absent. Multiple calcified granuloma in the spleen. No acute abnormality. Musculoskeletal: Gynecomastia is present bilaterally. Degenerative changes are present in the thoracic spine. No acute osseous abnormality. Review of the MIP images confirms the above findings. IMPRESSION: 1. Bilateral segmental and subsegmental pulmonary emboli. The right ventricle is distended suggesting underlying right heart strain. 2. Evidence of prior granulomatous disease. 3. Cardiomegaly. Electronically Signed: By: LBrett FairyM.D. On: 10/23/2022 23:24   DG Hip Unilat W or Wo Pelvis 2-3 Views Right  Result Date: 10/23/2022 CLINICAL DATA:  Right hip pain radiating to the right lower back. No recent falls or trauma. EXAM: DG HIP (WITH OR WITHOUT PELVIS) 2-3V RIGHT  COMPARISON:  Abdominal radiograph 04/24/2012 FINDINGS: Mildly decreased bone mineralization. Severe superolateral right femoroacetabular joint space narrowing and peripheral osteophytosis. Mild-to-moderate right femoral head-neck junction circumferential degenerative osteophytosis. Bilateral severe superior right femoroacetabular subchondral sclerosis. Moderate superolateral left femoroacetabular joint space narrowing and superolateral left acetabular and femoral head-neck junction degenerative osteophytosis. Mild bilateral sacroiliac subchondral sclerosis. Mild bilateral sacroiliac subchondral sclerosis. No acute fracture or dislocation. 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 Electronically Signed   By: RYvonne KendallM.D.   On: 10/23/2022 11:10   DG Lumbar Spine Complete  Result Date: 10/23/2022 CLINICAL DATA:  Right-sided radiculopathy. EXAM: LUMBAR SPINE - COMPLETE 4+ VIEW COMPARISON:  Chest and abdominal radiographs 04/30/2012, chest two views 03/29/2022, CT abdomen and pelvis 05/23/2011  FINDINGS: Visualization of the inferior thoracic ribs on prior radiographs limits counting the number of rib-bearing thoracic type vertebral bodies, however overall on prior radiographs and CT there appear to be 11 rib-bearing thoracic type vertebral bodies with very small hypoplastic ribs at the T12 level. Distal to this, the next 5 vertebral bodies are considered L1 through L5. Mild dextrocurvature centered at L2. Moderate left L2-3 and mild left L1-2 and right T12-L1 disc space narrowing. Chronic appearing concave degenerative Schmorl's nodes predominantly at the anterior aspect of the inferior L1 endplate, posterior aspect of the inferior L2 endplate, and superior greater than inferior L3 through L5 endplates. Large anterior T12-L1, L1-2, L3-4, and L5-S1 bridging osteophytes. Mild anterior L2-3, mild posterior L4-5, and moderate to severe L5-S1 endplate osteophytes. Right upper  quadrant likely cholecystectomy clips. IMPRESSION: 1. Moderate left L2-3 and mild left L1-2 and right T12-L1 disc space narrowing. 2. Large anterior T12-L1, L1-2, L3-4, and L5-S1 endplate osteophytes. 3. Mild dextrocurvature centered at L2. Electronically Signed   By: Yvonne Kendall M.D.   On: 10/23/2022 11:09   DG Chest 2 View  Result Date: 10/23/2022 CLINICAL DATA:  Right-sided radiculopathy.  Chest pain. EXAM: CHEST - 2 VIEW COMPARISON:  Chest two views 03/29/2022 and 07/06/2017 FINDINGS: Cardiac silhouette is at the upper limits of normal size. Mediastinal contours are within normal limits with mild calcification within the aortic arch. Mild chronic bilateral interstitial thickening within the bilateral lower lungs, similar to prior. No new acute airspace opacity is visualized. No pleural effusion or pneumothorax. Moderate multilevel disc space narrowing with large anterior level bridging degenerative osteophytes of the thoracic spine, similar to prior. Likely cholecystectomy clips are visualized on lateral view. IMPRESSION: 1. No acute cardiopulmonary process. 2. Mild chronic interstitial thickening within the bilateral lower lungs, similar to prior. This may represent mild interstitial scarring. Electronically Signed   By: Yvonne Kendall M.D.   On: 10/23/2022 11:01    Cardiac Studies   TTE pending  Patient Profile     Aaron Clarke is a 66 y.o. male with a hx of asthma, chronic back pain, HTN, CKD II, who is being seen 10/23/2022 for the evaluation of elevated troponin at the request of Dr. Tyrone Nine.   Assessment & Plan    Elevated troponin Bilateral segmental and subsegmental pulmonary emboli Hypoxia   Patient presenting to the ED after he was noted to be short of breath and hypoxic into the 70s at home. Initially hypoxic on room air in the ED as well. CXR non-acute. CTA chest with bilateral segmental and subsegmental pulmonary emboli and RV distension with cardiomegaly. Troponin 157, 199,  164.   Echo is pending, I have called the echo lab and requested that this be expedited given RV distension on CT Continue Heparin, follow lower extremity dopplers. Troponin leak attributed to hypoxia with PE. Patient without any recent angina-like symptoms. No further cardiac workup planned at this time.  Hypertension  Resume home Losartan, closely monitor BP with RV strain.  Concern for OSA  Patient/spouse reporting apneic episodes and loud snoring at night. Patient should have a sleep study completed following discharge.  Per primary team:  Chronic hip pain with ambulatory dysfunction 2/2 osteoarthritis.       For questions or updates, please contact [place] Please consult [email or web address] for contact info under        Signed, Lily Kocher, PA-C  10/24/2022, 7:19 AM

## 2022-10-30 ENCOUNTER — Ambulatory Visit (INDEPENDENT_AMBULATORY_CARE_PROVIDER_SITE_OTHER): Payer: Self-pay | Admitting: Internal Medicine

## 2022-10-30 ENCOUNTER — Encounter: Payer: Self-pay | Admitting: Internal Medicine

## 2022-10-30 VITALS — BP 180/90 | HR 85 | Ht 72.0 in | Wt 274.0 lb

## 2022-10-30 DIAGNOSIS — M5440 Lumbago with sciatica, unspecified side: Secondary | ICD-10-CM

## 2022-10-30 DIAGNOSIS — I10 Essential (primary) hypertension: Secondary | ICD-10-CM

## 2022-10-30 DIAGNOSIS — Z6841 Body Mass Index (BMI) 40.0 and over, adult: Secondary | ICD-10-CM

## 2022-10-30 DIAGNOSIS — L711 Rhinophyma: Secondary | ICD-10-CM

## 2022-10-30 DIAGNOSIS — I2699 Other pulmonary embolism without acute cor pulmonale: Secondary | ICD-10-CM

## 2022-10-30 MED ORDER — TRIAMCINOLONE ACETONIDE 40 MG/ML IJ SUSP
40.0000 mg | Freq: Once | INTRAMUSCULAR | Status: AC
  Administered 2022-10-30: 40 mg via INTRAMUSCULAR

## 2022-10-30 MED ORDER — VALSARTAN-HYDROCHLOROTHIAZIDE 160-12.5 MG PO TABS: 1.0000 | ORAL_TABLET | Freq: Every day | ORAL | 3 refills | Status: DC

## 2022-10-30 NOTE — Addendum Note (Signed)
Addended by: Angus Seller on: 10/30/2022 02:56 PM   Modules accepted: Orders

## 2022-10-30 NOTE — Progress Notes (Signed)
Established Patient Office Visit  Subjective:  Patient ID: Aaron Clarke, male    DOB: December 19, 1955  Age: 66 y.o. MRN: [id number]  CC:  Chief Complaint  Patient presents with   Hospitalization Follow-up    Got a shot the other day but was no help. Still hurts like crazy    HPI  Aaron Clarke presents for follow up  Past Medical History:  Diagnosis Date   Asthma    Cholelithiases    Chronic kidney disease    Kidney stone 04/2011   Family history of breast cancer    mother    Past Surgical History:  Procedure Laterality Date   CHOLECYSTECTOMY  12/19/2011   Procedure: LAPAROSCOPIC CHOLECYSTECTOMY WITH INTRAOPERATIVE CHOLANGIOGRAM;  Surgeon: Adin Hector, MD;  Location: [facility];  Service: General;  Laterality: N/A;   COLONOSCOPY WITH PROPOFOL N/A 09/13/2022   Procedure: COLONOSCOPY WITH PROPOFOL;  Surgeon: Lin Landsman, MD;  Location: [facility];  Service: Gastroenterology;  Laterality: N/A;   Colonscopy     Fatty tumor  2003   L chest    Family History  Problem Relation Age of Onset   Cancer Mother        breast   Cancer Father        colon   Heart disease Father    Asthma Sister    Anesthesia problems Neg Hx     Social History   Socioeconomic History   Marital status: Significant Other    Spouse name: Not on file   Number of children: Not on file   Years of education: Not on file   Highest education level: Not on file  Occupational History   Not on file  Tobacco Use   Smoking status: Former    Years: 14.00    Types: Cigarettes    Quit date: 06/19/1988    Years since quitting: 34.3   Smokeless tobacco: Never  Substance and Sexual Activity   Alcohol use: Yes    Comment: 2 -3 per day   Drug use: No   Sexual activity: Not on file  Other Topics Concern   Not on file  Social History Narrative   Not on file   Social Determinants of Health   Financial Resource Strain: Not on file  Food Insecurity: Not on file  Transportation  Needs: Not on file  Physical Activity: Not on file  Stress: Not on file  Social Connections: Not on file  Intimate Partner Violence: Not on file     Current Outpatient Medications:    acetaminophen (TYLENOL) 500 MG tablet, Take 500 mg by mouth every 6 (six) hours as needed for mild pain or moderate pain., Disp: , Rfl:    albuterol (VENTOLIN HFA) 108 (90 Base) MCG/ACT inhaler, Inhale 1-2 puffs into the lungs every 6 (six) hours as needed for wheezing or shortness of breath., Disp: 8 g, Rfl: 0   APIXABAN (ELIQUIS) VTE STARTER PACK ('10MG'$  AND '5MG'$ ), Take as directed on package: start with two-'5mg'$  tablets twice daily for 7 days. On day 8, switch to one-'5mg'$  tablet twice daily., Disp: 74 each, Rfl: 0   Cyanocobalamin (VITAMIN B-12 PO), Take 1 tablet by mouth daily., Disp: , Rfl:    gabapentin (NEURONTIN) 100 MG capsule, Take 1 capsule (100 mg total) by mouth 2 (two) times daily., Disp: 90 capsule, Rfl: 3   Lactobacillus-Inulin (PROBIOTIC DIGESTIVE SUPPORT PO), Take 1 tablet by mouth daily., Disp: , Rfl:    lidocaine 4 %, Place 1 patch  onto the skin daily., Disp: 15 patch, Rfl: 0   potassium phosphate, monobasic, (K-PHOS) 500 MG tablet, Take 1 tablet (500 mg total) by mouth 4 (four) times daily -  with meals and at bedtime., Disp: 120 tablet, Rfl: 0   valsartan-hydrochlorothiazide (DIOVAN-HCT) 160-12.5 MG tablet, Take 1 tablet by mouth daily., Disp: 90 tablet, Rfl: 3  Current Facility-Administered Medications:    diclofenac Sodium (VOLTAREN) 1 % topical gel 2 g, 2 g, Topical, QID, Sophie Tamez, MD   ofloxacin (FLOXIN) 0.3 % OTIC (EAR) solution 5 drop, 5 drop, Left EAR, Daily, Mycheal Veldhuizen, MD   No Known Allergies  ROS Review of Systems  Constitutional: Negative.   HENT: Negative.    Eyes: Negative.   Respiratory: Negative.    Cardiovascular: Negative.   Gastrointestinal: Negative.   Endocrine: Negative.   Genitourinary: Negative.   Musculoskeletal: Negative.   Skin: Negative.    Allergic/Immunologic: Negative.   Neurological: Negative.   Hematological: Negative.   Psychiatric/Behavioral: Negative.    All other systems reviewed and are negative.     Objective:    Physical Exam Vitals reviewed.  Constitutional:      Appearance: Normal appearance.  HENT:     Mouth/Throat:     Mouth: Mucous membranes are moist.  Eyes:     Pupils: Pupils are equal, round, and reactive to light.  Neck:     Vascular: No carotid bruit.  Cardiovascular:     Rate and Rhythm: Normal rate and regular rhythm.     Pulses: Normal pulses.     Heart sounds: Normal heart sounds.  Pulmonary:     Effort: Pulmonary effort is normal.     Breath sounds: Normal breath sounds.  Abdominal:     General: Bowel sounds are normal.     Palpations: Abdomen is soft. There is no hepatomegaly, splenomegaly or mass.     Tenderness: There is no abdominal tenderness.     Hernia: No hernia is present.  Musculoskeletal:     Cervical back: Neck supple.     Right lower leg: No edema.     Left lower leg: No edema.  Skin:    Findings: No rash.  Neurological:     Mental Status: He is alert and oriented to person, place, and time.     Motor: No weakness.  Psychiatric:        Mood and Affect: Mood normal.        Behavior: Behavior normal.     BP (!) 180/90   Pulse 85   Ht 6' (1.829 m)   Wt 274 lb (124.3 kg)   SpO2 98%   BMI 37.16 kg/m  Wt Readings from Last 3 Encounters:  10/30/22 274 lb (124.3 kg)  10/23/22 279 lb 15.8 oz (127 kg)  10/01/22 280 lb (127 kg)     Health Maintenance Due  Topic Date Due   DTaP/Tdap/Td (1 - Tdap) Never done    There are no preventive care reminders to display for this patient.  No results found for: "TSH" Lab Results  Component Value Date   WBC 5.3 10/23/2022   HGB 16.3 10/23/2022   HCT 47.5 10/23/2022   MCV 86.5 10/23/2022   PLT 136 (L) 10/23/2022   Lab Results  Component Value Date   NA 139 10/24/2022   K 4.5 10/24/2022   CO2 25 10/24/2022    GLUCOSE 162 (H) 10/24/2022   BUN 16 10/24/2022   CREATININE 1.14 10/24/2022   BILITOT 0.5 10/24/2022  ALKPHOS 44 10/24/2022   AST 21 10/24/2022   ALT 18 10/24/2022   PROT 6.3 (L) 10/24/2022   ALBUMIN 3.4 (L) 10/24/2022   CALCIUM 9.1 10/24/2022   ANIONGAP 8 10/24/2022   Lab Results  Component Value Date   CHOL 116 10/24/2022   Lab Results  Component Value Date   HDL 36 (L) 10/24/2022   Lab Results  Component Value Date   LDLCALC 74 10/24/2022   Lab Results  Component Value Date   TRIG 30 10/24/2022   Lab Results  Component Value Date   CHOLHDL 3.2 10/24/2022   No results found for: "HGBA1C"    Assessment & Plan:   Problem List Items Addressed This Visit       Cardiovascular and Mediastinum   Primary hypertension     Patient denies any chest pain or shortness of breath there is no history of palpitation or paroxysmal nocturnal dyspnea   patient was advised to follow low-salt low-cholesterol diet    ideally I want to keep systolic blood pressure below 130 mmHg, patient was asked to check blood pressure one times a week and give me a report on that.  Patient will be follow-up in 3 months  or earlier as needed, patient will call me back for any change in the cardiovascular symptoms Patient was advised to buy a book from local bookstore concerning blood pressure and read several chapters  every day.  This will be supplemented by some of the material we will give him from the office.  Patient should also utilize other resources like YouTube and Internet to learn more about the blood pressure and the diet.  Blood pressure was found to be elevated today.      Relevant Medications   valsartan-hydrochlorothiazide (DIOVAN-HCT) 160-12.5 MG tablet   Pulmonary embolus (HCC) - Primary    Stable without any recurrence, heart is regular chest no rales.      Relevant Medications   valsartan-hydrochlorothiazide (DIOVAN-HCT) 160-12.5 MG tablet     Nervous and Auditory    Lumbago of lumbar region with sciatica    Refer to Ortho, patient wants to get his hip replaced I told him that it will take another for 4 months because he is on anticoagulation        Other   Class 3 severe obesity due to excess calories with serious comorbidity and body mass index (BMI) of 40.0 to 44.9 in adult [facility])    - I encouraged the patient to lose weight.  - I educated them on making healthy dietary choices including eating more fruits and vegetables and less fried foods. - I encouraged the patient to exercise more, and educated on the benefits of exercise including weight loss, diabetes prevention, and hypertension prevention.   Dietary counseling with a registered dietician  Referral to a weight management support group (e.g. Weight Watchers, Overeaters Anonymous)  If your BMI is greater than 29 or you have gained more than 15 pounds you should work on weight loss.  Attend a healthy cooking class      Relevant Medications   valsartan-hydrochlorothiazide (DIOVAN-HCT) 160-12.5 MG tablet   Rhinophyma    Meds ordered this encounter  Medications   valsartan-hydrochlorothiazide (DIOVAN-HCT) 160-12.5 MG tablet    Sig: Take 1 tablet by mouth daily.    Dispense:  90 tablet    Refill:  3    Follow-up: No follow-ups on file.    Cletis Athens, MD

## 2022-10-30 NOTE — Assessment & Plan Note (Signed)

## 2022-10-30 NOTE — Assessment & Plan Note (Addendum)
Patient denies any chest pain or shortness of breath there is no history of palpitation or paroxysmal nocturnal dyspnea   patient was advised to follow low-salt low-cholesterol diet    ideally I want to keep systolic blood pressure below 130 mmHg, patient was asked to check blood pressure one times a week and give me a report on that.  Patient will be follow-up in 3 months  or earlier as needed, patient will call me back for any change in the cardiovascular symptoms Patient was advised to buy a book from local bookstore concerning blood pressure and read several chapters  every day.  This will be supplemented by some of the material we will give him from the office.  Patient should also utilize other resources like YouTube and Internet to learn more about the blood pressure and the diet.  Blood pressure was found to be elevated today.

## 2022-10-30 NOTE — Assessment & Plan Note (Signed)
Refer to Ortho, patient wants to get his hip replaced I told him that it will take another for 4 months because he is on anticoagulation

## 2022-10-30 NOTE — Assessment & Plan Note (Signed)
Stable without any recurrence, heart is regular chest no rales.

## 2022-11-01 ENCOUNTER — Telehealth: Payer: Self-pay

## 2022-11-01 NOTE — Telephone Encounter (Signed)
Patient would like to increase his gabapentin. Please advise.

## 2022-11-02 NOTE — Assessment & Plan Note (Signed)
Patient BP  Vitals:   10/11/22 1153 10/11/22 1154  BP: (!) 177/99 (!) 174/97    in the office today. Encourage patient to cut down on salt and consume heart healthy diet. Continue losartan 50 mg daily

## 2022-11-02 NOTE — Assessment & Plan Note (Signed)
Started him on gabapentin 100 mg twice daily. Advised patient to perform alternate hot and cold pack. Will continue to monitor

## 2022-11-07 ENCOUNTER — Ambulatory Visit (INDEPENDENT_AMBULATORY_CARE_PROVIDER_SITE_OTHER): Payer: Self-pay | Admitting: Internal Medicine

## 2022-11-07 ENCOUNTER — Encounter: Payer: Self-pay | Admitting: Internal Medicine

## 2022-11-07 VITALS — BP 128/84 | HR 100 | Ht 72.0 in

## 2022-11-07 DIAGNOSIS — I2694 Multiple subsegmental pulmonary emboli without acute cor pulmonale: Secondary | ICD-10-CM

## 2022-11-07 DIAGNOSIS — J4531 Mild persistent asthma with (acute) exacerbation: Secondary | ICD-10-CM

## 2022-11-07 DIAGNOSIS — S76011A Strain of muscle, fascia and tendon of right hip, initial encounter: Secondary | ICD-10-CM | POA: Insufficient documentation

## 2022-11-07 DIAGNOSIS — E66813 Obesity, class 3: Secondary | ICD-10-CM

## 2022-11-07 DIAGNOSIS — M1611 Unilateral primary osteoarthritis, right hip: Secondary | ICD-10-CM | POA: Insufficient documentation

## 2022-11-07 DIAGNOSIS — M25551 Pain in right hip: Secondary | ICD-10-CM | POA: Insufficient documentation

## 2022-11-07 DIAGNOSIS — Z6841 Body Mass Index (BMI) 40.0 and over, adult: Secondary | ICD-10-CM

## 2022-11-07 DIAGNOSIS — M7061 Trochanteric bursitis, right hip: Secondary | ICD-10-CM | POA: Insufficient documentation

## 2022-11-07 DIAGNOSIS — M5440 Lumbago with sciatica, unspecified side: Secondary | ICD-10-CM

## 2022-11-07 NOTE — Assessment & Plan Note (Signed)

## 2022-11-07 NOTE — Assessment & Plan Note (Signed)
Patient was not found to have any wheezing heart is regular

## 2022-11-07 NOTE — Assessment & Plan Note (Signed)
Stable at the present time. 

## 2022-11-07 NOTE — Assessment & Plan Note (Signed)
Refer to podiatry 

## 2022-11-07 NOTE — Progress Notes (Signed)
Established Patient Office Visit  Subjective:  Patient ID: Aaron Clarke, male    DOB: 22-Apr-1956  Age: 66 y.o. MRN: [id number]  CC: No chief complaint on file.   Hip Pain  The incident occurred more than 1 week ago. The pain is present in the right hip and right thigh. The quality of the pain is described as aching and shooting. The pain is at a severity of 8/10. The pain is moderate. Associated symptoms include an inability to bear weight, a loss of motion and muscle weakness. He has tried non-weight bearing for the symptoms.    Aaron Clarke presents for back and rt leg pain  Past Medical History:  Diagnosis Date   Asthma    Cholelithiases    Chronic kidney disease    Kidney stone 04/2011   Family history of breast cancer    mother    Past Surgical History:  Procedure Laterality Date   CHOLECYSTECTOMY  12/19/2011   Procedure: LAPAROSCOPIC CHOLECYSTECTOMY WITH INTRAOPERATIVE CHOLANGIOGRAM;  Surgeon: Adin Hector, MD;  Location: [facility];  Service: General;  Laterality: N/A;   COLONOSCOPY WITH PROPOFOL N/A 09/13/2022   Procedure: COLONOSCOPY WITH PROPOFOL;  Surgeon: Lin Landsman, MD;  Location: [facility];  Service: Gastroenterology;  Laterality: N/A;   Colonscopy     Fatty tumor  2003   L chest    Family History  Problem Relation Age of Onset   Cancer Mother        breast   Cancer Father        colon   Heart disease Father    Asthma Sister    Anesthesia problems Neg Hx     Social History   Socioeconomic History   Marital status: Significant Other    Spouse name: Not on file   Number of children: Not on file   Years of education: Not on file   Highest education level: Not on file  Occupational History   Not on file  Tobacco Use   Smoking status: Former    Years: 14.00    Types: Cigarettes    Quit date: 06/19/1988    Years since quitting: 34.4   Smokeless tobacco: Never  Substance and Sexual Activity   Alcohol use: Yes    Comment:  2 -3 per day   Drug use: No   Sexual activity: Not on file  Other Topics Concern   Not on file  Social History Narrative   Not on file   Social Determinants of Health   Financial Resource Strain: Not on file  Food Insecurity: Not on file  Transportation Needs: Not on file  Physical Activity: Not on file  Stress: Not on file  Social Connections: Not on file  Intimate Partner Violence: Not on file     Current Outpatient Medications:    acetaminophen (TYLENOL) 500 MG tablet, Take 500 mg by mouth every 6 (six) hours as needed for mild pain or moderate pain., Disp: , Rfl:    albuterol (VENTOLIN HFA) 108 (90 Base) MCG/ACT inhaler, Inhale 1-2 puffs into the lungs every 6 (six) hours as needed for wheezing or shortness of breath., Disp: 8 g, Rfl: 0   APIXABAN (ELIQUIS) VTE STARTER PACK ('10MG'$  AND '5MG'$ ), Take as directed on package: start with two-'5mg'$  tablets twice daily for 7 days. On day 8, switch to one-'5mg'$  tablet twice daily., Disp: 74 each, Rfl: 0   Cyanocobalamin (VITAMIN B-12 PO), Take 1 tablet by mouth daily., Disp: , Rfl:  gabapentin (NEURONTIN) 100 MG capsule, Take 1 capsule (100 mg total) by mouth 2 (two) times daily., Disp: 90 capsule, Rfl: 3   Lactobacillus-Inulin (PROBIOTIC DIGESTIVE SUPPORT PO), Take 1 tablet by mouth daily., Disp: , Rfl:    lidocaine 4 %, Place 1 patch onto the skin daily., Disp: 15 patch, Rfl: 0   potassium phosphate, monobasic, (K-PHOS) 500 MG tablet, Take 1 tablet (500 mg total) by mouth 4 (four) times daily -  with meals and at bedtime., Disp: 120 tablet, Rfl: 0   valsartan-hydrochlorothiazide (DIOVAN-HCT) 160-12.5 MG tablet, Take 1 tablet by mouth daily., Disp: 90 tablet, Rfl: 3  Current Facility-Administered Medications:    diclofenac Sodium (VOLTAREN) 1 % topical gel 2 g, 2 g, Topical, QID, Meshia Rau, MD   ofloxacin (FLOXIN) 0.3 % OTIC (EAR) solution 5 drop, 5 drop, Left EAR, Daily, Rosielee Corporan, MD   No Known Allergies  ROS Review of Systems   Constitutional: Negative.   HENT: Negative.    Eyes: Negative.   Respiratory: Negative.    Cardiovascular: Negative.   Gastrointestinal: Negative.   Endocrine: Negative.   Genitourinary: Negative.   Musculoskeletal: Negative.   Skin: Negative.   Allergic/Immunologic: Negative.   Neurological: Negative.   Hematological: Negative.   Psychiatric/Behavioral: Negative.    All other systems reviewed and are negative.     Objective:    Physical Exam Musculoskeletal:        General: Tenderness present.     Comments: Pain rt thigh and rt hip     BP 128/84   Pulse 100   Ht 6' (1.829 m)   SpO2 97%   BMI 37.16 kg/m  Wt Readings from Last 3 Encounters:  10/30/22 274 lb (124.3 kg)  10/23/22 279 lb 15.8 oz (127 kg)  10/01/22 280 lb (127 kg)     Health Maintenance Due  Topic Date Due   DTaP/Tdap/Td (1 - Tdap) Never done    There are no preventive care reminders to display for this patient.  No results found for: "TSH" Lab Results  Component Value Date   WBC 5.3 10/23/2022   HGB 16.3 10/23/2022   HCT 47.5 10/23/2022   MCV 86.5 10/23/2022   PLT 136 (L) 10/23/2022   Lab Results  Component Value Date   NA 139 10/24/2022   K 4.5 10/24/2022   CO2 25 10/24/2022   GLUCOSE 162 (H) 10/24/2022   BUN 16 10/24/2022   CREATININE 1.14 10/24/2022   BILITOT 0.5 10/24/2022   ALKPHOS 44 10/24/2022   AST 21 10/24/2022   ALT 18 10/24/2022   PROT 6.3 (L) 10/24/2022   ALBUMIN 3.4 (L) 10/24/2022   CALCIUM 9.1 10/24/2022   ANIONGAP 8 10/24/2022   Lab Results  Component Value Date   CHOL 116 10/24/2022   Lab Results  Component Value Date   HDL 36 (L) 10/24/2022   Lab Results  Component Value Date   LDLCALC 74 10/24/2022   Lab Results  Component Value Date   TRIG 30 10/24/2022   Lab Results  Component Value Date   CHOLHDL 3.2 10/24/2022   No results found for: "HGBA1C"    Assessment & Plan:   Problem List Items Addressed This Visit       Cardiovascular and  Mediastinum   Pulmonary embolus ([place]) - Primary    Stable at the present time        Respiratory   Asthma    Patient was not found to have any wheezing heart is regular  Nervous and Auditory   Lumbago of lumbar region with sciatica    Refer to orthopedic        Other   Class 3 severe obesity due to excess calories with serious comorbidity and body mass index (BMI) of 40.0 to 44.9 in adult [facility])    - I encouraged the patient to lose weight.  - I educated them on making healthy dietary choices including eating more fruits and vegetables and less fried foods. - I encouraged the patient to exercise more, and educated on the benefits of exercise including weight loss, diabetes prevention, and hypertension prevention.   Dietary counseling with a registered dietician  Referral to a weight management support group (e.g. Weight Watchers, Overeaters Anonymous)  If your BMI is greater than 29 or you have gained more than 15 pounds you should work on weight loss.  Attend a healthy cooking class       No orders of the defined types were placed in this encounter.   Follow-up: No follow-ups on file.    Cletis Athens, MD

## 2022-11-14 ENCOUNTER — Other Ambulatory Visit: Payer: Self-pay | Admitting: Plastic Surgery

## 2022-11-27 ENCOUNTER — Ambulatory Visit (INDEPENDENT_AMBULATORY_CARE_PROVIDER_SITE_OTHER): Payer: 59 | Admitting: Internal Medicine

## 2022-11-27 ENCOUNTER — Encounter: Payer: Self-pay | Admitting: Internal Medicine

## 2022-11-27 VITALS — BP 132/84 | HR 87 | Ht 72.0 in | Wt 273.0 lb

## 2022-11-27 DIAGNOSIS — Z6841 Body Mass Index (BMI) 40.0 and over, adult: Secondary | ICD-10-CM

## 2022-11-27 DIAGNOSIS — M1710 Unilateral primary osteoarthritis, unspecified knee: Secondary | ICD-10-CM

## 2022-11-27 DIAGNOSIS — L711 Rhinophyma: Secondary | ICD-10-CM

## 2022-11-27 DIAGNOSIS — I2694 Multiple subsegmental pulmonary emboli without acute cor pulmonale: Secondary | ICD-10-CM

## 2022-11-27 DIAGNOSIS — M5440 Lumbago with sciatica, unspecified side: Secondary | ICD-10-CM

## 2022-11-27 DIAGNOSIS — J4531 Mild persistent asthma with (acute) exacerbation: Secondary | ICD-10-CM

## 2022-11-27 MED ORDER — APIXABAN 5 MG PO TABS: 5.0000 mg | ORAL_TABLET | Freq: Two times a day (BID) | ORAL | 4 refills | Status: DC

## 2022-11-27 MED ORDER — VALSARTAN-HYDROCHLOROTHIAZIDE 160-12.5 MG PO TABS: 1.0000 | ORAL_TABLET | Freq: Every day | ORAL | 3 refills | Status: DC

## 2022-11-27 NOTE — Assessment & Plan Note (Signed)
Refer to Ortho?

## 2022-11-27 NOTE — Assessment & Plan Note (Signed)
Refer to dermatology 

## 2022-11-27 NOTE — Assessment & Plan Note (Signed)
Stable at the present time. 

## 2022-11-27 NOTE — Progress Notes (Signed)
Established Patient Office Visit  Subjective:  Patient ID: Aaron Clarke, male    DOB: 03-Oct-1956  Age: 67 y.o. MRN: [id number]  CC:  Chief Complaint  Patient presents with   Follow-up    HPI  Aaron Clarke presents for check up  Past Medical History:  Diagnosis Date   Asthma    Cholelithiases    Chronic kidney disease    Kidney stone 04/2011   Family history of breast cancer    mother    Past Surgical History:  Procedure Laterality Date   CHOLECYSTECTOMY  12/19/2011   Procedure: LAPAROSCOPIC CHOLECYSTECTOMY WITH INTRAOPERATIVE CHOLANGIOGRAM;  Surgeon: Adin Hector, MD;  Location: [facility];  Service: General;  Laterality: N/A;   COLONOSCOPY WITH PROPOFOL N/A 09/13/2022   Procedure: COLONOSCOPY WITH PROPOFOL;  Surgeon: Lin Landsman, MD;  Location: [facility];  Service: Gastroenterology;  Laterality: N/A;   Colonscopy     Fatty tumor  2003   L chest    Family History  Problem Relation Age of Onset   Cancer Mother        breast   Cancer Father        colon   Heart disease Father    Asthma Sister    Anesthesia problems Neg Hx     Social History   Socioeconomic History   Marital status: Significant Other    Spouse name: Not on file   Number of children: Not on file   Years of education: Not on file   Highest education level: Not on file  Occupational History   Not on file  Tobacco Use   Smoking status: Former    Years: 14.00    Types: Cigarettes    Quit date: 06/19/1988    Years since quitting: 34.4   Smokeless tobacco: Never  Substance and Sexual Activity   Alcohol use: Yes    Comment: 2 -3 per day   Drug use: No   Sexual activity: Not on file  Other Topics Concern   Not on file  Social History Narrative   Not on file   Social Determinants of Health   Financial Resource Strain: Not on file  Food Insecurity: Not on file  Transportation Needs: Not on file  Physical Activity: Not on file  Stress: Not on file  Social  Connections: Not on file  Intimate Partner Violence: Not on file     Current Outpatient Medications:    acetaminophen (TYLENOL) 500 MG tablet, Take 500 mg by mouth every 6 (six) hours as needed for mild pain or moderate pain., Disp: , Rfl:    albuterol (VENTOLIN HFA) 108 (90 Base) MCG/ACT inhaler, Inhale 1-2 puffs into the lungs every 6 (six) hours as needed for wheezing or shortness of breath., Disp: 8 g, Rfl: 0   Cyanocobalamin (VITAMIN B-12 PO), Take 1 tablet by mouth daily., Disp: , Rfl:    gabapentin (NEURONTIN) 100 MG capsule, Take 1 capsule (100 mg total) by mouth 2 (two) times daily., Disp: 90 capsule, Rfl: 3   Lactobacillus-Inulin (PROBIOTIC DIGESTIVE SUPPORT PO), Take 1 tablet by mouth daily., Disp: , Rfl:    lidocaine 4 %, Place 1 patch onto the skin daily., Disp: 15 patch, Rfl: 0   valsartan-hydrochlorothiazide (DIOVAN-HCT) 160-12.5 MG tablet, Take 1 tablet by mouth daily., Disp: 90 tablet, Rfl: 3   VITAMIN D PO, Take by mouth., Disp: , Rfl:   Current Facility-Administered Medications:    diclofenac Sodium (VOLTAREN) 1 % topical gel 2 g, 2  g, Topical, QID, Elizet Kaplan, MD   ofloxacin (FLOXIN) 0.3 % OTIC (EAR) solution 5 drop, 5 drop, Left EAR, Daily, Jackquelyn Sundberg, MD   No Known Allergies  ROS Review of Systems  Constitutional: Negative.   HENT: Negative.    Eyes: Negative.   Respiratory: Negative.    Cardiovascular: Negative.   Gastrointestinal: Negative.   Endocrine: Negative.   Genitourinary: Negative.   Musculoskeletal: Negative.   Skin: Negative.   Allergic/Immunologic: Negative.   Neurological: Negative.   Hematological: Negative.   Psychiatric/Behavioral: Negative.    All other systems reviewed and are negative.     Objective:    Physical Exam Vitals reviewed.  Constitutional:      Appearance: Normal appearance.  HENT:     Mouth/Throat:     Mouth: Mucous membranes are moist.  Eyes:     Pupils: Pupils are equal, round, and reactive to light.   Neck:     Vascular: No carotid bruit.  Cardiovascular:     Rate and Rhythm: Normal rate and regular rhythm.     Pulses: Normal pulses.     Heart sounds: Normal heart sounds.  Pulmonary:     Effort: Pulmonary effort is normal.     Breath sounds: Normal breath sounds.  Abdominal:     General: Bowel sounds are normal.     Palpations: Abdomen is soft. There is no hepatomegaly, splenomegaly or mass.     Tenderness: There is no abdominal tenderness.     Hernia: No hernia is present.  Musculoskeletal:     Cervical back: Neck supple.     Right lower leg: No edema.     Left lower leg: No edema.  Skin:    Findings: No rash.  Neurological:     Mental Status: He is alert and oriented to person, place, and time.     Motor: No weakness.  Psychiatric:        Mood and Affect: Mood normal.        Behavior: Behavior normal.     BP 132/84   Pulse 87   Ht 6' (1.829 m)   Wt 273 lb (123.8 kg)   SpO2 96%   BMI 37.03 kg/m  Wt Readings from Last 3 Encounters:  11/27/22 273 lb (123.8 kg)  10/30/22 274 lb (124.3 kg)  10/23/22 279 lb 15.8 oz (127 kg)     Health Maintenance Due  Topic Date Due   DTaP/Tdap/Td (1 - Tdap) Never done    There are no preventive care reminders to display for this patient.  No results found for: "TSH" Lab Results  Component Value Date   WBC 5.3 10/23/2022   HGB 16.3 10/23/2022   HCT 47.5 10/23/2022   MCV 86.5 10/23/2022   PLT 136 (L) 10/23/2022   Lab Results  Component Value Date   NA 139 10/24/2022   K 4.5 10/24/2022   CO2 25 10/24/2022   GLUCOSE 162 (H) 10/24/2022   BUN 16 10/24/2022   CREATININE 1.14 10/24/2022   BILITOT 0.5 10/24/2022   ALKPHOS 44 10/24/2022   AST 21 10/24/2022   ALT 18 10/24/2022   PROT 6.3 (L) 10/24/2022   ALBUMIN 3.4 (L) 10/24/2022   CALCIUM 9.1 10/24/2022   ANIONGAP 8 10/24/2022   Lab Results  Component Value Date   CHOL 116 10/24/2022   Lab Results  Component Value Date   HDL 36 (L) 10/24/2022   Lab Results   Component Value Date   LDLCALC 74 10/24/2022  Lab Results  Component Value Date   TRIG 30 10/24/2022   Lab Results  Component Value Date   CHOLHDL 3.2 10/24/2022   No results found for: "HGBA1C"    Assessment & Plan:   Problem List Items Addressed This Visit       Cardiovascular and Mediastinum   Pulmonary embolus ([place]) - Primary    Patient is on blood thinner        Respiratory   Asthma    Stable at the present time        Nervous and Auditory   Lumbago of lumbar region with sciatica    - Patient's back pain is under control with medication.  - Encouraged the patient to stretch or do yoga as able to help with back pain        Musculoskeletal and Integument   Knee arthropathy    Refer to Ortho        Other   Class 3 severe obesity due to excess calories with serious comorbidity and body mass index (BMI) of 40.0 to 44.9 in adult [facility])    - I encouraged the patient to lose weight.  - I educated them on making healthy dietary choices including eating more fruits and vegetables and less fried foods. - I encouraged the patient to exercise more, and educated on the benefits of exercise including weight loss, diabetes prevention, and hypertension prevention.   Dietary counseling with a registered dietician  Referral to a weight management support group (e.g. Weight Watchers, Overeaters Anonymous)  If your BMI is greater than 29 or you have gained more than 15 pounds you should work on weight loss.  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   Follow-up: No follow-ups on file.    Cletis Athens, MD

## 2022-11-27 NOTE — Assessment & Plan Note (Signed)

## 2022-11-27 NOTE — Assessment & Plan Note (Signed)
Patient is on blood thinner

## 2022-11-27 NOTE — Assessment & Plan Note (Signed)
-   Patient's back pain is under control with medication.  - Encouraged the patient to stretch or do yoga as able to help with back pain 

## 2022-12-14 NOTE — Progress Notes (Signed)
Left message

## 2023-05-02 ENCOUNTER — Ambulatory Visit
Admission: EM | Admit: 2023-05-02 | Discharge: 2023-05-02 | Disposition: A | Discharge status: Home / Self Care | Payer: Self-pay | Attending: Emergency Medicine | Admitting: Emergency Medicine

## 2023-05-02 DIAGNOSIS — H60393 Other infective otitis externa, bilateral: Secondary | ICD-10-CM

## 2023-05-02 DIAGNOSIS — J4521 Mild intermittent asthma with (acute) exacerbation: Secondary | ICD-10-CM

## 2023-05-02 MED ORDER — PREDNISONE 20 MG PO TABS: 40.0000 mg | ORAL_TABLET | Freq: Every day | ORAL | 0 refills | Status: DC

## 2023-05-02 MED ORDER — CIPROFLOXACIN-DEXAMETHASONE 0.3-0.1 % OT SUSP: 4.0000 [drp] | Freq: Two times a day (BID) | OTIC | 0 refills | Status: DC

## 2023-05-02 MED ORDER — PROMETHAZINE-DM 6.25-15 MG/5ML PO SYRP: 5.0000 mL | ORAL_SOLUTION | Freq: Every evening | ORAL | 0 refills | Status: DC | PRN

## 2023-05-02 NOTE — Discharge Instructions (Signed)
On exam there is Aaron Clarke drainage in both your ears and therefore you are being treated for an outer ear infection  Place 4 drops of Ciprodex into both ears every morning and every evening for 7 days, if you have any in your medicine from the pharmacy please call the clinic so we can send in a new prescription for you  Avoid ear cleaning or object placement into the canal to prevent further irritation  If you begin to have any pain you may take Tylenol and/or Motrin every 6 hours as needed  If your symptoms continue to persist past use of the medicine please follow-up for reevaluation  Currently on exam your lungs are clear and you are getting enough air without assistance, do not believe you to have pneumonia but believe your asthma is flared  Starting tomorrow take prednisone every morning with food for 5 days to help open and relax the airway, should calm, shortness of breath and wheezing  You may use cough syrup at bedtime as needed for additional comfort, be mindful this may make you sleepy  For any concerns regarding your breathing please follow-up for reevaluation

## 2023-05-02 NOTE — ED Provider Notes (Signed)
[name]    CSN: [id number] Arrival date & time: 05/02/23  1546      History   Chief Complaint Chief Complaint  Patient presents with   Otalgia    HPI Aaron Clarke is a 67 y.o. male.   Presents for evaluation of bilateral ear drainage and pruritus present for 7 days.  Has been experiencing shortness of breath at rest, wheezing and a nonproductive cough and only present overnight, symptoms improving throughout the day.  Has attempted use of Mucinex and an inhaler which has been minimally helpful.  No known sick contacts.  Tolerating food and liquids.  Denies ear pain, decreased hearing.  Denies recent swimming.  History of asthma.  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 09/04/2022   Knee arthropathy 09/04/2022   Screening for colon cancer 08/07/2022   Lumbago of lumbar region with sciatica 08/07/2022   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   Rosacea 05/20/2021   Cholecystitis with cholelithiasis 12/19/2011   Class 3 severe obesity due to excess calories with serious comorbidity and body mass index (BMI) of 40.0 to 44.9 in adult [facility]) 12/14/2011   Asthma 12/14/2011    Past Surgical History:  Procedure Laterality Date   CHOLECYSTECTOMY  12/19/2011   Procedure: LAPAROSCOPIC CHOLECYSTECTOMY WITH INTRAOPERATIVE CHOLANGIOGRAM;  Surgeon: Ernestene Mention, MD;  Location: [facility] OR;  Service: General;   Laterality: N/A;   COLONOSCOPY WITH PROPOFOL N/A 09/13/2022   Procedure: COLONOSCOPY WITH PROPOFOL;  Surgeon: Toney Reil, MD;  Location: [facility];  Service: Gastroenterology;  Laterality: N/A;   Colonscopy     Fatty tumor  2003   L chest       Home Medications    Prior to Admission medications   Medication Sig Start Date End Date Taking? Authorizing Provider  albuterol (VENTOLIN HFA) 108 (90 Base) MCG/ACT inhaler Inhale 1-2 puffs into the lungs every 6 (six) hours as needed for wheezing or shortness of breath. 08/20/22  Yes Tomi Bamberger, PA-C  Cyanocobalamin (VITAMIN B-12 PO) Take 1 tablet by mouth daily.   Yes [provider]  Lactobacillus-Inulin (PROBIOTIC DIGESTIVE SUPPORT PO) Take 1 tablet by mouth daily.   Yes [provider]  valsartan-hydrochlorothiazide (DIOVAN-HCT) 160-12.5 MG tablet Take 1 tablet by mouth daily. 11/27/22  Yes Corky Downs, MD  VITAMIN D PO Take by mouth.   Yes [provider]  acetaminophen (TYLENOL) 500 MG tablet Take 500 mg by mouth every 6 (six) hours as needed for mild pain or moderate pain.    [provider]  apixaban (ELIQUIS) 5 MG TABS tablet Take 1 tablet (5 mg total) by mouth 2 (two) times daily. 11/27/22   Corky Downs, MD  gabapentin (NEURONTIN) 100 MG capsule Take 1 capsule (100 mg total) by mouth 2 (two) times daily. 10/11/22   Kara Dies, NP  lidocaine 4 % Place 1 patch onto the skin daily. 10/03/22   Cecil Cobbs, PA-C    Family History Family History  Problem Relation Age of Onset   Cancer Mother        breast   Cancer Father        colon   Heart disease Father    Asthma Sister    Anesthesia problems Neg Hx     Social History Social History   Tobacco Use   Smoking status: Former    Years: 14    Types: Cigarettes    Quit date: 06/19/1988    Years since quitting: 34.8   Smokeless tobacco: Never  Vaping Use   Vaping Use: Never used  Substance Use Topics    Alcohol use: Not Currently    Comment: Quit drinking in 2023   Drug use: No     Allergies   Patient has no known allergies.   Review of Systems Review of Systems  Constitutional: Negative.   HENT:  Positive for congestion and ear pain. Negative for dental problem, drooling, ear discharge, facial swelling, hearing loss, mouth sores, nosebleeds, postnasal drip, rhinorrhea, sinus pressure, sinus pain, sneezing, sore throat, tinnitus, trouble swallowing and voice change.   Respiratory:  Positive for cough, shortness of breath and wheezing. Negative for apnea, choking, chest tightness and stridor.   Cardiovascular: Negative.   Gastrointestinal: Negative.      Physical Exam Triage Vital Signs ED Triage Vitals [05/02/23 1614]  Enc Vitals Group     BP (!) 151/93     Pulse Rate 87     Resp 19     Temp 98.3 F (36.8 C)     Temp Source Oral     SpO2 94 %     Weight 270 lb (122.5 kg)     Height 6' (1.829 m)     Head Circumference      Peak Flow      Pain Score 0     Pain Loc      Pain Edu?      Excl. in GC?    No data found.  Updated Vital Signs BP (!) 151/93 (BP Location: Left Arm)   Pulse 87   Temp 98.3 F (36.8 C) (Oral)   Resp 19   Ht 6' (1.829 m)   Wt 270 lb (122.5 kg)   SpO2 94%   BMI 36.62 kg/m   Visual Acuity Right Eye Distance:   Left Eye Distance:   Bilateral Distance:    Right Eye Near:   Left Eye Near:    Bilateral Near:     Physical Exam Constitutional:      Appearance: Normal appearance.  HENT:     Ears:     Comments: Aaron Clarke puslike drainage to the bilateral ear canals without swelling, no abnormalities to the tympanic membrane or external ear Eyes:     Extraocular Movements: Extraocular movements intact.  Cardiovascular:     Rate and Rhythm: Normal rate and regular rhythm.     Pulses: Normal pulses.     Heart sounds: Normal heart sounds.  Pulmonary:     Effort: Pulmonary effort is normal.     Breath sounds: Normal breath sounds.   Neurological:     Mental Status: He is alert and oriented to person, place, and time. Mental status is at baseline.      UC Treatments / Results  Labs (all labs ordered are listed, but only abnormal results are displayed)  Labs Reviewed - No data to display  EKG   Radiology No results found.  Procedures Procedures (including critical care time)  Medications Ordered in UC Medications - No data to display  Initial Impression / Assessment and Plan / UC Course  I have reviewed the triage vital signs and the nursing notes.  Pertinent labs & imaging results that were available during my care of the patient were reviewed by me and considered in my medical decision making (see chart for details).  Infective otitis externa of both ears, mild intermittent asthma with acute exacerbation  Presentation of the ears canals consistent with infection, discussed with patient, prescribed Ciprodex and advised against any ear cleaning or object placement within the canal, if pain occurs may use over-the-counter analgesics and warm compresses, given short precautions to follow-up for reevaluation  Vital signs are stable, O2 saturation 94% on room air, lungs are clear to auscultation, symptoms occurring intermittently most likely related to asthma, low suspicion for pneumonia or bronchitis at this time therefore imaging deferred, prescribed prednisone burst and advised continued use of albuterol inhaler with follow-up as needed Final Clinical Impressions(s) / UC Diagnoses   Final diagnoses:  None   Discharge Instructions   None    ED Prescriptions   None    PDMP not reviewed this encounter.   Valinda Hoar, NP 05/02/23 1752

## 2023-05-02 NOTE — ED Triage Notes (Signed)
Patient here today with c/o bilat ear drainage and itching for over a week now. He has nasal congestion at night and has difficulty breathing at night while he is lying down. He has taken Mucinex at night with no relief. No sick contacts. No recent travel.

## 2023-07-30 ENCOUNTER — Ambulatory Visit
Admission: EM | Admit: 2023-07-30 | Discharge: 2023-07-30 | Disposition: A | Discharge status: Home / Self Care | Payer: Self-pay | Attending: Physician Assistant | Admitting: Physician Assistant

## 2023-07-30 DIAGNOSIS — J45901 Unspecified asthma with (acute) exacerbation: Secondary | ICD-10-CM

## 2023-07-30 MED ORDER — IPRATROPIUM-ALBUTEROL 0.5-2.5 (3) MG/3ML IN SOLN
3.0000 mL | Freq: Once | RESPIRATORY_TRACT | Status: AC
  Administered 2023-07-30: 3 mL via RESPIRATORY_TRACT

## 2023-07-30 MED ORDER — PREDNISONE 20 MG PO TABS: 40.0000 mg | ORAL_TABLET | Freq: Every day | ORAL | 0 refills | Status: AC

## 2023-07-30 NOTE — ED Triage Notes (Signed)
"  I am short of breath, it got really bad yesterday after cutting grass, this morning around 3am couldn't breath".

## 2023-08-04 NOTE — ED Provider Notes (Signed)
[name]    CSN: [id number] Arrival date & time: 07/30/23  1636      History   Chief Complaint Chief Complaint  Patient presents with   Shortness of Breath    HPI Race Aaron Clarke is a 67 y.o. male.   Patient here today for evaluation of shortness of breath that seem to worsen after he cut grass yesterday.  He states this morning around 3 AM he had issues with breathing due to congestion.  He has had this happen in the past.  He has known asthma and uses inhaler without resolution.  He does need refill.  The history is provided by the patient.  Shortness of Breath Associated symptoms: no cough, no fever and no vomiting     Past Medical History:  Diagnosis Date   Asthma    Cholelithiases    Chronic kidney disease    Kidney stone 04/2011   Family history of breast cancer    mother    Patient Active Problem List   Diagnosis Date Noted   Pain in joint of right hip 11/07/2022   Arthritis of right hip 11/07/2022   Strain of muscle of right hip 11/07/2022   Trochanteric bursitis of right hip 11/07/2022   Dyspnea on exertion 10/24/2022   Elevated troponin 10/24/2022   Pulmonary embolus (HCC) 10/24/2022   Acute hypoxic respiratory failure (HCC) 10/23/2022   Primary hypertension 10/18/2022   Personal history of colonic polyps    Family history of colon cancer in father    Polyp of ascending colon    Cecal polyp    Chronic diffuse otitis externa of both ears 09/04/2022   Knee arthropathy 09/04/2022   Screening for colon cancer 08/07/2022   Lumbago of lumbar region with sciatica 08/07/2022   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   Rosacea 05/20/2021   Cholecystitis with cholelithiasis 12/19/2011   Class 3 severe obesity due to excess calories with serious comorbidity and body mass index (BMI) of 40.0 to 44.9 in adult [facility]) 12/14/2011   Asthma 12/14/2011    Past Surgical History:  Procedure Laterality Date   CHOLECYSTECTOMY   12/19/2011   Procedure: LAPAROSCOPIC CHOLECYSTECTOMY WITH INTRAOPERATIVE CHOLANGIOGRAM;  Surgeon: Ernestene Mention, MD;  Location: [facility] OR;  Service: General;  Laterality: N/A;   COLONOSCOPY WITH PROPOFOL N/A 09/13/2022   Procedure: COLONOSCOPY WITH PROPOFOL;  Surgeon: Toney Reil, MD;  Location: [facility] ENDOSCOPY;  Service: Gastroenterology;  Laterality: N/A;   Colonscopy     Fatty tumor  2003   L chest       Home Medications    Prior to Admission medications   Medication Sig Start Date End Date Taking? Authorizing Provider  albuterol (VENTOLIN HFA) 108 (90 Base) MCG/ACT inhaler Inhale 1-2 puffs into the lungs every 6 (six) hours as needed for wheezing or shortness of breath. Patient taking differently: Inhale 1-2 puffs into the lungs every 6 (six) hours as needed for wheezing or shortness of breath. Last used: 9am today. 08/20/22  Yes Tomi Bamberger, PA-C  predniSONE (DELTASONE) 20 MG tablet Take 2 tablets (40 mg total) by mouth daily with breakfast for 5 days. 07/30/23 08/04/23 Yes Tomi Bamberger, PA-C  acetaminophen (TYLENOL) 500 MG tablet Take 500 mg by mouth every 6 (six) hours as needed for mild pain or moderate pain.    [provider]  apixaban (ELIQUIS) 5 MG TABS tablet Take 1 tablet (5 mg total) by mouth 2 (two) times daily. 11/27/22  Corky Downs, MD  ciprofloxacin-dexamethasone (CIPRODEX) OTIC suspension Place 4 drops into both ears 2 (two) times daily. 05/02/23   White, Elita Boone, NP  Cyanocobalamin (VITAMIN B-12 PO) Take 1 tablet by mouth daily.    [provider]  gabapentin (NEURONTIN) 100 MG capsule Take 1 capsule (100 mg total) by mouth 2 (two) times daily. 10/11/22   Kara Dies, NP  Lactobacillus-Inulin (PROBIOTIC DIGESTIVE SUPPORT PO) Take 1 tablet by mouth daily.    [provider]  lidocaine 4 % Place 1 patch onto the skin daily. 10/03/22   Cecil Cobbs, PA-C  promethazine-dextromethorphan (PROMETHAZINE-DM) 6.25-15 MG/5ML  syrup Take 5 mLs by mouth at bedtime as needed for cough. 05/02/23   White, Elita Boone, NP  valsartan-hydrochlorothiazide (DIOVAN-HCT) 160-12.5 MG tablet Take 1 tablet by mouth daily. 11/27/22   Corky Downs, MD  VITAMIN D PO Take by mouth.    [provider]    Family History Family History  Problem Relation Age of Onset   Cancer Mother        breast   Cancer Father        colon   Heart disease Father    Asthma Sister    Anesthesia problems Neg Hx     Social History Social History   Tobacco Use   Smoking status: Former    Current packs/day: 0.00    Types: Cigarettes    Start date: 06/19/1974    Quit date: 06/19/1988    Years since quitting: 35.1   Smokeless tobacco: Never  Vaping Use   Vaping status: Never Used  Substance Use Topics   Alcohol use: Not Currently    Comment: Quit drinking in 2023   Drug use: No     Allergies   Patient has no known allergies.   Review of Systems Review of Systems  Constitutional:  Negative for chills and fever.  HENT:  Positive for congestion.   Eyes:  Negative for discharge and redness.  Respiratory:  Positive for shortness of breath. Negative for cough.   Gastrointestinal:  Negative for diarrhea, nausea and vomiting.  Neurological:  Negative for numbness.     Physical Exam Triage Vital Signs ED Triage Vitals  Encounter Vitals Group     BP 07/30/23 1643 129/77     Systolic BP Percentile --      Diastolic BP Percentile --      Pulse Rate 07/30/23 1643 86     Resp 07/30/23 1643 (!) 24     Temp 07/30/23 1643 98.2 F (36.8 C)     Temp Source 07/30/23 1643 Oral     SpO2 07/30/23 1643 92 %     Weight 07/30/23 1641 260 lb (117.9 kg)     Height 07/30/23 1641 6' (1.829 m)     Head Circumference --      Peak Flow --      Pain Score 07/30/23 1639 0     Pain Loc --      Pain Education --      Exclude from Growth Chart --    No data found.  Updated Vital Signs BP 129/77 (BP Location: Left Arm)   Pulse 86   Temp  98.2 F (36.8 C) (Oral)   Resp (!) 22   Ht 6' (1.829 m)   Wt 260 lb (117.9 kg)   SpO2 96%   BMI 35.26 kg/m     Physical Exam Vitals and nursing note reviewed.  Constitutional:  General: He is not in acute distress.    Appearance: Normal appearance. He is not ill-appearing.  HENT:     Head: Normocephalic and atraumatic.     Nose: Nose normal. No congestion or rhinorrhea.     Mouth/Throat:     Mouth: Mucous membranes are moist.     Pharynx: Oropharynx is clear. No oropharyngeal exudate or posterior oropharyngeal erythema.  Eyes:     Conjunctiva/sclera: Conjunctivae normal.  Cardiovascular:     Rate and Rhythm: Normal rate and regular rhythm.  Pulmonary:     Effort: Pulmonary effort is normal. No respiratory distress.     Breath sounds: Wheezing (scattered) present. No rhonchi or rales.  Neurological:     Mental Status: He is alert.  Psychiatric:        Mood and Affect: Mood normal.        Behavior: Behavior normal.        Thought Content: Thought content normal.      UC Treatments / Results  Labs (all labs ordered are listed, but only abnormal results are displayed) Labs Reviewed - No data to display  EKG   Radiology No results found.  Procedures Procedures (including critical care time)  Medications Ordered in UC Medications  ipratropium-albuterol (DUONEB) 0.5-2.5 (3) MG/3ML nebulizer solution 3 mL (3 mLs Nebulization Given 07/30/23 1654)    Initial Impression / Assessment and Plan / UC Course  I have reviewed the triage vital signs and the nursing notes.  Pertinent labs & imaging results that were available during my care of the patient were reviewed by me and considered in my medical decision making (see chart for details).    Steroid burst prescribed for suspected asthma exacerbation.  DuoNeb administered in office with some relief.  Encouraged follow-up if no gradual improvement with any further concerns.  Final Clinical Impressions(s) / UC  Diagnoses   Final diagnoses:  Asthma with acute exacerbation, unspecified asthma severity, unspecified whether persistent   Discharge Instructions   None    ED Prescriptions     Medication Sig Dispense Auth. Provider   predniSONE (DELTASONE) 20 MG tablet Take 2 tablets (40 mg total) by mouth daily with breakfast for 5 days. 10 tablet Tomi Bamberger, PA-C      PDMP not reviewed this encounter.   Tomi Bamberger, PA-C 08/04/23 1500

## 2023-10-27 ENCOUNTER — Emergency Department (HOSPITAL_COMMUNITY)
Admission: EM | Admit: 2023-10-27 | Discharge: 2023-10-27 | Disposition: A | Discharge status: Home / Self Care | Payer: Medicare Other | Attending: Emergency Medicine | Admitting: Emergency Medicine

## 2023-10-27 ENCOUNTER — Other Ambulatory Visit: Payer: Self-pay

## 2023-10-27 ENCOUNTER — Encounter (HOSPITAL_COMMUNITY): Payer: Self-pay | Admitting: Emergency Medicine

## 2023-10-27 ENCOUNTER — Emergency Department (HOSPITAL_COMMUNITY): Payer: Medicare Other

## 2023-10-27 DIAGNOSIS — M25551 Pain in right hip: Secondary | ICD-10-CM | POA: Insufficient documentation

## 2023-10-27 DIAGNOSIS — R0602 Shortness of breath: Secondary | ICD-10-CM | POA: Diagnosis present

## 2023-10-27 DIAGNOSIS — J45909 Unspecified asthma, uncomplicated: Secondary | ICD-10-CM | POA: Insufficient documentation

## 2023-10-27 DIAGNOSIS — R059 Cough, unspecified: Secondary | ICD-10-CM | POA: Diagnosis not present

## 2023-10-27 DIAGNOSIS — G8929 Other chronic pain: Secondary | ICD-10-CM | POA: Insufficient documentation

## 2023-10-27 DIAGNOSIS — M25552 Pain in left hip: Secondary | ICD-10-CM | POA: Insufficient documentation

## 2023-10-27 DIAGNOSIS — Z7901 Long term (current) use of anticoagulants: Secondary | ICD-10-CM | POA: Insufficient documentation

## 2023-10-27 DIAGNOSIS — N189 Chronic kidney disease, unspecified: Secondary | ICD-10-CM | POA: Insufficient documentation

## 2023-10-27 DIAGNOSIS — R600 Localized edema: Secondary | ICD-10-CM | POA: Insufficient documentation

## 2023-10-27 LAB — CBC WITH DIFFERENTIAL/PLATELET
Abs Immature Granulocytes: 0.02 10*3/uL (ref 0.00–0.07)
Basophils Absolute: 0.1 10*3/uL (ref 0.0–0.1)
Basophils Relative: 1 %
Eosinophils Absolute: 0.4 10*3/uL (ref 0.0–0.5)
Eosinophils Relative: 6 %
HCT: 46.1 % (ref 39.0–52.0)
Hemoglobin: 15.1 g/dL (ref 13.0–17.0)
Immature Granulocytes: 0 %
Lymphocytes Relative: 19 %
Lymphs Abs: 1.4 10*3/uL (ref 0.7–4.0)
MCH: 30 pg (ref 26.0–34.0)
MCHC: 32.8 g/dL (ref 30.0–36.0)
MCV: 91.7 fL (ref 80.0–100.0)
Monocytes Absolute: 0.5 10*3/uL (ref 0.1–1.0)
Monocytes Relative: 6 %
Neutro Abs: 4.9 10*3/uL (ref 1.7–7.7)
Neutrophils Relative %: 68 %
Platelets: 145 10*3/uL — ABNORMAL LOW (ref 150–400)
RBC: 5.03 MIL/uL (ref 4.22–5.81)
RDW: 13.6 % (ref 11.5–15.5)
WBC: 7.2 10*3/uL (ref 4.0–10.5)
nRBC: 0 % (ref 0.0–0.2)

## 2023-10-27 LAB — COMPREHENSIVE METABOLIC PANEL
ALT: 25 U/L (ref 0–44)
AST: 21 U/L (ref 15–41)
Albumin: 3.6 g/dL (ref 3.5–5.0)
Alkaline Phosphatase: 47 U/L (ref 38–126)
Anion gap: 8 (ref 5–15)
BUN: 20 mg/dL (ref 8–23)
CO2: 24 mmol/L (ref 22–32)
Calcium: 9.3 mg/dL (ref 8.9–10.3)
Chloride: 106 mmol/L (ref 98–111)
Creatinine, Ser: 1.22 mg/dL (ref 0.61–1.24)
GFR, Estimated: >60 mL/min (ref >60)
Glucose, Bld: 109 mg/dL — ABNORMAL HIGH (ref 70–99)
Potassium: 4.3 mmol/L (ref 3.5–5.1)
Sodium: 138 mmol/L (ref 135–145)
Total Bilirubin: 0.8 mg/dL (ref <1.2)
Total Protein: 6.7 g/dL (ref 6.5–8.1)

## 2023-10-27 LAB — TROPONIN I (HIGH SENSITIVITY): Troponin I (High Sensitivity): 6 ng/L (ref <18)

## 2023-10-27 LAB — D-DIMER, QUANTITATIVE: D-Dimer, Quant: 0.49 ug{FEU}/mL (ref 0.00–0.50)

## 2023-10-27 MED ORDER — IPRATROPIUM-ALBUTEROL 0.5-2.5 (3) MG/3ML IN SOLN
3.0000 mL | Freq: Once | RESPIRATORY_TRACT | Status: AC
  Administered 2023-10-27: 3 mL via RESPIRATORY_TRACT
  Filled 2023-10-27: qty 3

## 2023-10-27 MED ORDER — ALBUTEROL SULFATE (2.5 MG/3ML) 0.083% IN NEBU: 2.5000 mg | INHALATION_SOLUTION | Freq: Four times a day (QID) | RESPIRATORY_TRACT | 12 refills | Status: DC | PRN

## 2023-10-27 NOTE — ED Triage Notes (Signed)
Pt reports cough and SHOB x 2 days. Pt reports hx of PE and reports he took blood thinner for 3 months when d/c from hospital.

## 2023-10-27 NOTE — ED Notes (Signed)
Pt returned from X-ray.  

## 2023-10-27 NOTE — ED Provider Notes (Addendum)
[place] EMERGENCY DEPARTMENT AT [facility] Provider Note   CSN: [id number] Arrival date & time: 10/27/23  0827     History  Chief Complaint  Patient presents with   Shortness of Breath    Aaron Clarke is a 67 y.o. male.  Patient with a complaint of cough and shortness of breath for 2 days.  Patient's wife says been wheezing a lot at night.  He has been using his inhaler heavily.  Patient does not feel like he is wheezing now.  Patient is also had a prior history of pulmonary embolus was a year ago was on blood thinners just for 3 months stopped it prematurely.  Patient also with a complaint of bilateral hip pain right greater than left.  Been told when he had a primary care doctor that he needed to hip replacement surgery has not been plugged into orthopedics yet.  Past medical history Sniffing and for asthma  and chronic kidney disease patient's had his gallbladder removed patient is a former smoker quit in 1989.  Patient quit drinking in 2023.  Patient's vital signs here are temps 98.5 respirations 15 heart rate 72 blood pressure 145/77 oxygen sats 96%.       Home Medications Prior to Admission medications   Medication Sig Start Date End Date Taking? Authorizing Provider  acetaminophen (TYLENOL) 500 MG tablet Take 500 mg by mouth every 6 (six) hours as needed for mild pain or moderate pain.    [provider]  albuterol (VENTOLIN HFA) 108 (90 Base) MCG/ACT inhaler Inhale 1-2 puffs into the lungs every 6 (six) hours as needed for wheezing or shortness of breath. Patient taking differently: Inhale 1-2 puffs into the lungs every 6 (six) hours as needed for wheezing or shortness of breath. Last used: 9am today. 08/20/22   Tomi Bamberger, PA-C  apixaban (ELIQUIS) 5 MG TABS tablet Take 1 tablet (5 mg total) by mouth 2 (two) times daily. 11/27/22   Corky Downs, MD  ciprofloxacin-dexamethasone (CIPRODEX) OTIC suspension Place 4 drops into both ears 2 (two)  times daily. 05/02/23   White, Elita Boone, NP  Cyanocobalamin (VITAMIN B-12 PO) Take 1 tablet by mouth daily.    [provider]  gabapentin (NEURONTIN) 100 MG capsule Take 1 capsule (100 mg total) by mouth 2 (two) times daily. 10/11/22   Kara Dies, NP  Lactobacillus-Inulin (PROBIOTIC DIGESTIVE SUPPORT PO) Take 1 tablet by mouth daily.    [provider]  lidocaine 4 % Place 1 patch onto the skin daily. 10/03/22   Cecil Cobbs, PA-C  promethazine-dextromethorphan (PROMETHAZINE-DM) 6.25-15 MG/5ML syrup Take 5 mLs by mouth at bedtime as needed for cough. 05/02/23   White, Elita Boone, NP  valsartan-hydrochlorothiazide (DIOVAN-HCT) 160-12.5 MG tablet Take 1 tablet by mouth daily. 11/27/22   Corky Downs, MD  VITAMIN D PO Take by mouth.    [provider]      Allergies    Patient has no known allergies.    Review of Systems   Review of Systems  Constitutional:  Negative for chills and fever.  HENT:  Negative for ear pain and sore throat.   Eyes:  Negative for pain and visual disturbance.  Respiratory:  Positive for cough, shortness of breath and wheezing.   Cardiovascular:  Positive for leg swelling. Negative for chest pain and palpitations.  Gastrointestinal:  Negative for abdominal pain and vomiting.  Genitourinary:  Negative for dysuria and hematuria.  Musculoskeletal:  Negative for arthralgias and back pain.  Skin:  Negative for color change and rash.  Neurological:  Negative for seizures and syncope.  All other systems reviewed and are negative.   Physical Exam Updated Vital Signs BP (!) 145/77   Pulse 72   Temp 98.5 F (36.9 C) (Oral)   Resp 15   SpO2 96%  Physical Exam Vitals and nursing note reviewed.  Constitutional:      General: He is not in acute distress.    Appearance: Normal appearance. He is well-developed. He is not ill-appearing.  HENT:     Head: Normocephalic and atraumatic.  Eyes:     Extraocular Movements: Extraocular  movements intact.     Conjunctiva/sclera: Conjunctivae normal.     Pupils: Pupils are equal, round, and reactive to light.  Cardiovascular:     Rate and Rhythm: Normal rate and regular rhythm.     Heart sounds: No murmur heard. Pulmonary:     Effort: Pulmonary effort is normal. No respiratory distress.     Breath sounds: Normal breath sounds. No wheezing, rhonchi or rales.  Abdominal:     Palpations: Abdomen is soft.     Tenderness: There is no abdominal tenderness.  Musculoskeletal:        General: No swelling.     Cervical back: Neck supple.     Right lower leg: Edema present.     Left lower leg: Edema present.  Skin:    General: Skin is warm and dry.     Capillary Refill: Capillary refill takes less than 2 seconds.  Neurological:     General: No focal deficit present.     Mental Status: He is alert and oriented to person, place, and time.  Psychiatric:        Mood and Affect: Mood normal.     ED Results / Procedures / Treatments   Labs (all labs ordered are listed, but only abnormal results are displayed) Labs Reviewed  D-DIMER, QUANTITATIVE  CBC WITH DIFFERENTIAL/PLATELET  COMPREHENSIVE METABOLIC PANEL  TROPONIN I (HIGH SENSITIVITY)    EKG EKG Interpretation Date/Time:  Saturday October 27 2023 08:49:35 EST Ventricular Rate:  71 PR Interval:  162 QRS Duration:  118 QT Interval:  416 QTC Calculation: 453 R Axis:   75  Text Interpretation: Sinus rhythm Incomplete right bundle branch block Probable anterolateral infarct, old No significant change since last tracing Confirmed by Vanetta Mulders [phone]) on 10/27/2023 9:05:09 AM  Radiology DG Hips Bilat W or Wo Pelvis 3-4 Views  Result Date: 10/27/2023 CLINICAL DATA:  67 year old male with right greater than left hip pain. EXAM: DG HIP (WITH OR WITHOUT PELVIS) 3-4V BILAT COMPARISON:  Right hip series 10/23/2022. FINDINGS: Background bone mineralization is normal for age. Pelvis appears stable and intact. SI joints  appear symmetric. Partially visible chronic lower lumbar disc and endplate degeneration. Negative abdominal and pelvic visceral contours. Dedicated AP and frogleg lateral views of both hips. Bilateral proximal femurs appear intact. No acute osseous abnormality identified. Hip osteoarthritis is asymmetric and moderate to severe on the right, more mild to moderate on the left. Since last year partial subchondral collapse of the superior right femoral head on the AP view of the pelvis appears to be new. And subchondral sclerosis in that region has increased. IMPRESSION: 1. No acute osseous abnormality identified about the hips or pelvis. 2. Progressed asymmetric hip osteoarthritis since last year, moderate to severe on the Right. Electronically Signed   By: Odessa Fleming M.D.   On: 10/27/2023 10:08   DG Chest  Port 1 View  Result Date: 10/27/2023 CLINICAL DATA:  Shortness of breath EXAM: PORTABLE CHEST 1 VIEW COMPARISON:  10/23/2022 FINDINGS: Stable cardiomediastinal contours. Lung volumes are low. There is atelectasis identified within the left lower lung peripherally. No pleural fluid, interstitial edema or airspace consolidation. Visualized osseous structures appear intact. IMPRESSION: Low lung volumes and left lower lung atelectasis. Electronically Signed   By: Signa Kell M.D.   On: 10/27/2023 10:03    Procedures Procedures    Medications Ordered in ED Medications  ipratropium-albuterol (DUONEB) 0.5-2.5 (3) MG/3ML nebulizer solution 3 mL (3 mLs Nebulization Given 10/27/23 1018)    ED Course/ Medical Decision Making/ A&P                                 Medical Decision Making Amount and/or Complexity of Data Reviewed Labs: ordered. Radiology: ordered.  Risk Prescription drug management.   Chest x-ray low lung volumes and left lower lobe lung atelectasis but no acute findings.  Patient on lung exam here without any wheezing.  EKG without any significant changes.  Patient had x-rays of both  hips because of his complaint of bilateral hip pain.  No acute osseous abnormalities this is progressed to have asymmetric hip osteoarthritis since last year moderate to severe on the right which goes along with his history of needing a hip replacement.  Have not ordered CTA chest we will see what D-dimer and labs show.  Still pending.  Also patient given nebulizer treatment here although not wheezing to see if it makes him feel better.  Oxygen sats were good here and he was not tachycardic.  Patient D-dimer normal.  CBC normal complete metabolic panel normal LFTs are normal.  Initial troponin was normal at 6.  X-rays of both hips show significant osteoarthritis in the right hip.  Which would explain his pain.  Chest x-ray without any acute findings.  Feel patient probably had asthma exacerbation and also probably is in need of right hip replacement.  Will refer to orthopedics patient has nebulizer machine at home but he is out of albuterol nebulizer solution still has his inhalers.  Do not feel the patient needs steroids.  No wheezing here today.   Final Clinical Impression(s) / ED Diagnoses Final diagnoses:  SOB (shortness of breath)  Chronic right hip pain    Rx / DC Orders ED Discharge Orders     None         Vanetta Mulders, MD 10/27/23 1025    Vanetta Mulders, MD 10/27/23 1232

## 2023-10-27 NOTE — Discharge Instructions (Signed)
Recommend taking extra strength Tylenol 2 tablets every 8 hours for the hip pain.  Call orthopedic office on Monday for appointment and follow-up.  Based on today's x-rays does appear that you are ready for consideration of hip replacement.  Have ordered albuterol nebulizer solution for you.  Use your inhaler or other albuterol nebulizer treatments every 6 hours.

## 2023-11-01 ENCOUNTER — Ambulatory Visit (INDEPENDENT_AMBULATORY_CARE_PROVIDER_SITE_OTHER): Payer: Medicare Other | Admitting: Orthopedic Surgery

## 2023-11-01 ENCOUNTER — Encounter: Payer: Self-pay | Admitting: Orthopedic Surgery

## 2023-11-01 VITALS — BP 142/84 | HR 120 | Ht 72.0 in | Wt 289.0 lb

## 2023-11-01 DIAGNOSIS — M1610 Unilateral primary osteoarthritis, unspecified hip: Secondary | ICD-10-CM

## 2023-11-01 DIAGNOSIS — M1611 Unilateral primary osteoarthritis, right hip: Secondary | ICD-10-CM

## 2023-11-01 DIAGNOSIS — M160 Bilateral primary osteoarthritis of hip: Secondary | ICD-10-CM | POA: Diagnosis not present

## 2023-11-01 NOTE — Patient Instructions (Signed)
We are referring you to [facility] from [facility] Office address is [address] [place] The phone number is [phone]  The office will call you with an appointment Dr. Blackman    

## 2023-11-01 NOTE — Progress Notes (Signed)
Office Visit Note   Patient: Aaron Clarke           Date of Birth: 15-Mar-1956           MRN: [id number] Visit Date: 11/01/2023 Requested by: Corky Downs, MD [address] [place],  [place] [address] PCP: Corky Downs, MD   Assessment & Plan:   Encounter Diagnoses  Name Primary?   Unilateral primary osteoarthritis, right hip Yes   Hip arthritis LEFT HIP     No orders of the defined types were placed in this encounter.   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 Unilateral primary osteoarthritis, right hip   2. Hip arthritis LEFT HIP      Follow-Up Instructions: No follow-ups on file.    Objective: Vital Signs: BP (!) 142/84   Pulse (!) 120   Ht 6' (1.829 m)   Wt 289 lb (131.1 kg)   BMI 39.20 kg/m   Physical Exam Vitals and nursing note reviewed.  Constitutional:      Appearance: Normal appearance.  HENT:     Head: Normocephalic and atraumatic.  Eyes:     General: No scleral icterus.       Right eye: No discharge.         Left eye: No discharge.     Extraocular Movements: Extraocular movements intact.     Conjunctiva/sclera: Conjunctivae normal.     Pupils: Pupils are equal, round, and reactive to light.  Cardiovascular:     Rate and Rhythm: Normal rate.     Pulses: Normal pulses.  Skin:    General: Skin is warm and dry.     Capillary Refill: Capillary refill takes less than 2 seconds.  Neurological:     General: No focal deficit present.     Mental Status: He is alert and oriented to person, place, and time.  Psychiatric:        Mood and Affect: Mood normal.        Behavior: Behavior normal.        Thought Content: Thought content normal.        Judgment: Judgment normal.      Right Hip Exam   Tenderness  The patient is experiencing no tenderness.   Range of Motion  Abduction:  abnormal  Flexion:  abnormal  Internal rotation:  abnormal   Muscle Strength  The patient has normal right hip strength.  Tests  FABER: negative  Other  Erythema: absent Sensation: normal Pulse: present  Comments:  HIP STABILITY NORMAL   Pain increases with internal rotation   Left Hip Exam   Tenderness  The patient is experiencing no tenderness.   Range of Motion  Abduction:  abnormal  Flexion:  abnormal  Internal rotation: abnormal   Muscle Strength  The patient has normal left hip strength.   Tests  FABER: negative  Other  Erythema: absent Sensation: normal Pulse: present  Comments:  HIP STABILITY NORMAL   As noted on the right pain increases with internal rotation       Specialty Comments:  No specialty comments available.  Imaging: No results found.   PMFS History: Patient Active Problem List   Diagnosis Date Noted   Pain in joint of right hip 11/07/2022   Arthritis of right hip 11/07/2022   Strain of muscle of right hip 11/07/2022   Trochanteric bursitis of right hip 11/07/2022   Dyspnea on exertion 10/24/2022   Elevated troponin 10/24/2022   Pulmonary  embolus (HCC) 10/24/2022   Acute hypoxic respiratory failure (HCC) 10/23/2022   Primary hypertension 10/18/2022   History of colonic polyps    Family history of colon cancer in father    Polyp of ascending colon    Cecal polyp    Chronic diffuse otitis externa of both ears 09/04/2022   Knee arthropathy 09/04/2022   Screening for colon cancer 08/07/2022   Lumbago of lumbar region with sciatica 08/07/2022   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) 12/14/2011   Asthma 12/14/2011   Past Medical History:  Diagnosis Date   Asthma    Cholelithiases    Chronic kidney disease    Kidney stone 04/2011   Family history of breast cancer    mother    Family History  Problem Relation Age of Onset   Cancer Mother        breast   Cancer Father        colon   Heart disease Father    Asthma Sister    Anesthesia problems Neg Hx     Past Surgical History:  Procedure Laterality Date   CHOLECYSTECTOMY  12/19/2011   Procedure: LAPAROSCOPIC CHOLECYSTECTOMY WITH INTRAOPERATIVE CHOLANGIOGRAM;  Surgeon: Ernestene Mention, MD;  Location: [facility] OR;  Service: General;  Laterality: N/A;   COLONOSCOPY WITH PROPOFOL N/A 09/13/2022   Procedure: COLONOSCOPY WITH PROPOFOL;  Surgeon: Toney Reil, MD;  Location: [facility] ENDOSCOPY;  Service: Gastroenterology;  Laterality: N/A;   Colonscopy     Fatty tumor  2003   L chest   Social History   Occupational History   Not on file  Tobacco Use   Smoking status: Former    Current packs/day: 0.00    Types: Cigarettes    Start date: 06/19/1974    Quit date: 06/19/1988    Years since quitting: 35.3   Smokeless tobacco: Never  Vaping Use   Vaping status: Never Used  Substance and Sexual Activity   Alcohol use: Not Currently    Comment: Quit  drinking in 2023   Drug use: No   Sexual activity: Not Currently

## 2023-12-03 ENCOUNTER — Ambulatory Visit (INDEPENDENT_AMBULATORY_CARE_PROVIDER_SITE_OTHER): Payer: Medicare Other | Admitting: Orthopaedic Surgery

## 2023-12-03 VITALS — Ht 72.0 in | Wt 289.0 lb

## 2023-12-03 DIAGNOSIS — M25551 Pain in right hip: Secondary | ICD-10-CM

## 2023-12-03 DIAGNOSIS — M1611 Unilateral primary osteoarthritis, right hip: Secondary | ICD-10-CM | POA: Diagnosis not present

## 2023-12-03 MED ORDER — METHOCARBAMOL 500 MG PO TABS: 500.0000 mg | ORAL_TABLET | Freq: Four times a day (QID) | ORAL | 1 refills | Status: DC | PRN

## 2023-12-03 MED ORDER — CELECOXIB 200 MG PO CAPS: 200.0000 mg | ORAL_CAPSULE | Freq: Two times a day (BID) | ORAL | 1 refills | Status: DC | PRN

## 2023-12-03 NOTE — Progress Notes (Signed)
 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  I actually saw him remotely before our practice even joint with coming so it has been well over 6 years or more.  At that time we were going to proceed with a total hip arthroplasty but then unfortunately he had blood clots that developed into pulmonary embolus bilaterally.  He was on Eliquis  for a while.  He has then been lost to follow-up.  He does ambit with a cane.  He said that he could not come back to see us  anytime due to losing his health insurance.  He also is no longer on blood thinning medication.  He did go to the emergency room back in December just a month ago due to shortness of breath.  He does have asthma.  Workup was negative for any cardiac issues.  It was the CT scan of his chest in December 2023 that showed the significant bilateral saddle embolus in his chest.  He is now no longer on blood thinner medication.  He denies any significant medical issues but he has not seen a medical physician in a very long amount of time and apparently he does see a primary care physician next week.  I have given him a card to give to the primary care physician about looking into clearance medically and cardiac for hip replacement surgery.  More recent x-rays of his pelvis and right hip show bone-on-bone wear of the right hip.  The joint space is completely loss.  There are cystic changes and sclerotic changes as well as again noted superior lateral joint space left.  His left hip does show some narrowing.  Exam his right hip has significantly decreased motion with severe pain in the groin.  We will put for follow-up appointment next month to see if he is medically cleared for proceeding with a right total hip arthroplasty.  His BMI is 39.  I will try at least a short course of methocarbamol  and Celebrex  to see if this will help but he will use these sparingly.  He will still use Tylenol  as needed  as well.  When we see him back in a month no x-rays are needed.  I did give him a handout about hip replacement surgery again.

## 2023-12-17 ENCOUNTER — Ambulatory Visit: Payer: Medicare Other | Admitting: Family Medicine

## 2023-12-17 ENCOUNTER — Encounter: Payer: Self-pay | Admitting: Family Medicine

## 2023-12-17 VITALS — BP 132/76 | HR 84 | Temp 98.4°F | Ht 73.0 in | Wt 290.0 lb

## 2023-12-17 DIAGNOSIS — Z13220 Encounter for screening for lipoid disorders: Secondary | ICD-10-CM | POA: Diagnosis not present

## 2023-12-17 DIAGNOSIS — Z0001 Encounter for general adult medical examination with abnormal findings: Secondary | ICD-10-CM | POA: Diagnosis not present

## 2023-12-17 DIAGNOSIS — J4530 Mild persistent asthma, uncomplicated: Secondary | ICD-10-CM

## 2023-12-17 DIAGNOSIS — Z6838 Body mass index (BMI) 38.0-38.9, adult: Secondary | ICD-10-CM

## 2023-12-17 DIAGNOSIS — Z23 Encounter for immunization: Secondary | ICD-10-CM | POA: Diagnosis not present

## 2023-12-17 DIAGNOSIS — Z125 Encounter for screening for malignant neoplasm of prostate: Secondary | ICD-10-CM | POA: Diagnosis not present

## 2023-12-17 DIAGNOSIS — Z0000 Encounter for general adult medical examination without abnormal findings: Secondary | ICD-10-CM | POA: Insufficient documentation

## 2023-12-17 NOTE — Assessment & Plan Note (Signed)

## 2023-12-17 NOTE — Patient Instructions (Signed)
It was great to meet you today and I'm excited to have you join the [facility] practice. I hope you had a positive experience today! If you feel so inclined, please feel free to recommend our practice to friends and family. Kurtis Bushman, FNP-C

## 2023-12-17 NOTE — Progress Notes (Signed)
New Patient Office Visit  Subjective    Patient ID: Aaron Clarke, male    DOB: 1956/08/16  Age: 68 y.o. MRN: [id number]  CC:  Chief Complaint  Patient presents with   Establish Care    Per pt will need hip surgery clearance per pt's Ortho provider as soon as he can so pt can get R-hip Surgery done.     HPI Aaron Clarke presents to establish care. Oriented to practice routines and expectations. Has been seeing a PCP regularly. PMH includes asthma, HTN, right hip osteoarthritis, PEs, and CKD. He is seeing Orthopedics. He does not see a dentist regularly or ophthalmologist, counseling provided. Exercise is limited by right hip pain. He works as a Conservator, museum/gallery.   Colon CA screening: colonoscopy 2 years ago  with  abnormalities, polyps, dicerticulosis. Prostate CA screening: Agrees to PSA testing Tobacco: non-smoker STI: declines Vaccines:  PNA, Tdap today, declines flu and covid    Outpatient Encounter Medications as of 12/17/2023  Medication Sig   celecoxib (CELEBREX) 200 MG capsule Take 1 capsule (200 mg total) by mouth 2 (two) times daily between meals as needed.   methocarbamol (ROBAXIN) 500 MG tablet Take 1 tablet (500 mg total) by mouth every 6 (six) hours as needed.   valsartan-hydrochlorothiazide (DIOVAN-HCT) 160-12.5 MG tablet Take 1 tablet by mouth daily.   acetaminophen (TYLENOL) 500 MG tablet Take 500 mg by mouth every 6 (six) hours as needed for mild pain or moderate pain. (Patient not taking: Reported on 11/01/2023)   albuterol (PROVENTIL) (2.5 MG/3ML) 0.083% nebulizer solution Take 3 mLs (2.5 mg total) by nebulization every 6 (six) hours as needed for wheezing or shortness of breath. (Patient not taking: Reported on 12/17/2023)   albuterol (VENTOLIN HFA) 108 (90 Base) MCG/ACT inhaler Inhale 1-2 puffs into the lungs every 6 (six) hours as needed for wheezing or shortness of breath. (Patient not taking: Reported on 11/01/2023)   apixaban  (ELIQUIS) 5 MG TABS tablet Take 1 tablet (5 mg total) by mouth 2 (two) times daily. (Patient not taking: Reported on 12/17/2023)   Cyanocobalamin (VITAMIN B-12 PO) Take 1 tablet by mouth daily. (Patient not taking: Reported on 11/01/2023)   gabapentin (NEURONTIN) 100 MG capsule Take 1 capsule (100 mg total) by mouth 2 (two) times daily. (Patient not taking: Reported on 11/01/2023)   Lactobacillus-Inulin (PROBIOTIC DIGESTIVE SUPPORT PO) Take 1 tablet by mouth daily. (Patient not taking: Reported on 11/01/2023)   lidocaine 4 % Place 1 patch onto the skin daily. (Patient not taking: Reported on 11/01/2023)   VITAMIN D PO Take by mouth. (Patient not taking: Reported on 12/17/2023)   Facility-Administered Encounter Medications as of 12/17/2023  Medication   diclofenac Sodium (VOLTAREN) 1 % topical gel 2 g   ofloxacin (FLOXIN) 0.3 % OTIC (EAR) solution 5 drop    Past Medical History:  Diagnosis Date   Asthma    Cholelithiases    Chronic kidney disease    Kidney stone 04/2011   Family history of breast cancer    mother    Past Surgical History:  Procedure Laterality Date   CHOLECYSTECTOMY  12/19/2011   Procedure: LAPAROSCOPIC CHOLECYSTECTOMY WITH INTRAOPERATIVE CHOLANGIOGRAM;  Surgeon: Ernestene Mention, MD;  Location: [facility] OR;  Service: General;  Laterality: N/A;   COLONOSCOPY WITH PROPOFOL N/A 09/13/2022   Procedure: COLONOSCOPY WITH PROPOFOL;  Surgeon: Toney Reil, MD;  Location: [facility] ENDOSCOPY;  Service: Gastroenterology;  Laterality: N/A;   Colonscopy     Fatty tumor  2003   L chest    Family History  Problem Relation Age of Onset   Cancer Mother        breast   Cancer Father        colon   Heart disease Father    Asthma Sister    Anesthesia problems Neg Hx     Social History   Socioeconomic History   Marital status: Significant Other    Spouse name: Not on file   Number of children: Not on file   Years of education: Not on file   Highest education level: Not on file   Occupational History   Not on file  Tobacco Use   Smoking status: Former    Current packs/day: 0.00    Types: Cigarettes    Start date: 06/19/1974    Quit date: 06/19/1988    Years since quitting: 35.5   Smokeless tobacco: Never  Vaping Use   Vaping status: Never Used  Substance and Sexual Activity   Alcohol use: Not Currently    Comment: Quit drinking in 2023   Drug use: No   Sexual activity: Not Currently  Other Topics Concern   Not on file  Social History Narrative   Not on file   Social Drivers of Health   Financial Resource Strain: Not on file  Food Insecurity: Not on file  Transportation Needs: Not on file  Physical Activity: Not on file  Stress: Not on file  Social Connections: Not on file  Intimate Partner Violence: Not on file    Review of Systems  Constitutional: Negative.   HENT:  Positive for congestion.   Eyes: Negative.   Respiratory:  Positive for shortness of breath and wheezing.   Cardiovascular: Negative.   Gastrointestinal: Negative.   Genitourinary: Negative.   Musculoskeletal:  Positive for joint pain.  Skin: Negative.   Neurological: Negative.   Endo/Heme/Allergies: Negative.   Psychiatric/Behavioral: Negative.    All other systems reviewed and are negative.       Objective    BP 132/76   Pulse 84   Temp 98.4 F (36.9 C) (Oral)   Ht 6\' 1"  (1.854 m)   Wt 290 lb (131.5 kg)   SpO2 94%   BMI 38.26 kg/m   Physical Exam Vitals and nursing note reviewed.  Constitutional:      Appearance: Normal appearance. He is obese.  HENT:     Head: Normocephalic and atraumatic.     Right Ear: Tympanic membrane, ear canal and external ear normal.     Left Ear: Tympanic membrane, ear canal and external ear normal.     Nose: Nose normal.     Mouth/Throat:     Mouth: Mucous membranes are moist.     Pharynx: Oropharynx is clear.  Eyes:     Extraocular Movements: Extraocular movements intact.     Right eye: Normal extraocular motion and no  nystagmus.     Left eye: Normal extraocular motion and no nystagmus.     Conjunctiva/sclera: Conjunctivae normal.     Pupils: Pupils are equal, round, and reactive to light.  Cardiovascular:     Rate and Rhythm: Normal rate and regular rhythm.     Pulses: Normal pulses.     Heart sounds: Normal heart sounds.  Pulmonary:     Effort: Pulmonary effort is normal.     Breath sounds: Normal breath sounds.  Abdominal:     General: Bowel sounds are normal.     Palpations: Abdomen is  soft.  Genitourinary:    Comments: Deferred using shared decision making Musculoskeletal:        General: Normal range of motion.     Cervical back: Normal range of motion and neck supple.  Skin:    General: Skin is warm and dry.     Capillary Refill: Capillary refill takes less than 2 seconds.  Neurological:     General: No focal deficit present.     Mental Status: He is alert. Mental status is at baseline.  Psychiatric:        Mood and Affect: Mood normal.        Speech: Speech normal.        Behavior: Behavior normal.        Thought Content: Thought content normal.        Cognition and Memory: Cognition and memory normal.        Judgment: Judgment normal.         Assessment & Plan:   Problem List Items Addressed This Visit     Asthma   Poor control in setting of recent viral URI. Encouraged to use albuterol q6h PRN for wheezing and SOB and report to office if needed more than twice a week. Lungs CTAB on exam.       Physical exam, annual - Primary   Today your medical history was reviewed and routine physical exam with labs was performed. Recommend 150 minutes of moderate intensity exercise weekly and consuming a well-balanced diet. Advised to stop smoking if a smoker, avoid smoking if a non-smoker, limit alcohol consumption to 1 drink per day for women and 2 drinks per day for men, and avoid illicit drug use. Counseled in mental health awareness and when to seek medical care. Vaccine maintenance  discussed. Appropriate health maintenance items reviewed. Return to office in 1 year for annual physical exam.       Relevant Orders   Lipid panel   PSA   Morbid obesity (HCC)   Exercise limited by right hip pain due for replacement with ortho. BMI 38.26 today, PMH includes hypertension requiring medication management. Encouraged low calorie, heart healthy diet and moderate intensity exercise 150 minutes weekly. This is 3-5 times weekly for 30-50 minutes each session. Goal should be pace of 3 miles/hours, or walking 1.5 miles in 30 minutes and include strength training.       Relevant Orders   Hemoglobin A1c   BMI 38.0-38.9,adult   Other Visit Diagnoses       Prostate cancer screening       Relevant Orders   PSA     Screening for lipoid disorders       Relevant Orders   Lipid panel     Need for vaccination       Relevant Orders   Pneumococcal conjugate vaccine 20-valent (Prevnar 20) (Completed)   Tdap vaccine greater than or equal to 7yo IM (Completed)       Return for AWV.   Park Meo, FNP

## 2023-12-17 NOTE — Assessment & Plan Note (Signed)
Poor control in setting of recent viral URI. Encouraged to use albuterol q6h PRN for wheezing and SOB and report to office if needed more than twice a week. Lungs CTAB on exam.

## 2023-12-17 NOTE — Assessment & Plan Note (Signed)
Exercise limited by right hip pain due for replacement with ortho. BMI 38.26 today, PMH includes hypertension requiring medication management. Encouraged low calorie, heart healthy diet and moderate intensity exercise 150 minutes weekly. This is 3-5 times weekly for 30-50 minutes each session. Goal should be pace of 3 miles/hours, or walking 1.5 miles in 30 minutes and include strength training.

## 2023-12-18 ENCOUNTER — Other Ambulatory Visit: Payer: Self-pay | Admitting: Internal Medicine

## 2023-12-18 DIAGNOSIS — E66813 Obesity, class 3: Secondary | ICD-10-CM

## 2023-12-18 LAB — LIPID PANEL
Cholesterol: 140 mg/dL (ref <200)
HDL: 39 mg/dL — ABNORMAL LOW (ref >=40)
LDL Cholesterol (Calc): 80 mg/dL
Non-HDL Cholesterol (Calc): 101 mg/dL (ref <130)
Total CHOL/HDL Ratio: 3.6 (calc) (ref <5.0)
Triglycerides: 117 mg/dL (ref <150)

## 2023-12-18 LAB — HEMOGLOBIN A1C
Hgb A1c MFr Bld: 5.7 %{Hb} — ABNORMAL HIGH (ref <5.7)
Mean Plasma Glucose: 117 mg/dL
eAG (mmol/L): 6.5 mmol/L

## 2023-12-18 LAB — PSA: PSA: 0.92 ng/mL (ref <=4.00)

## 2023-12-27 ENCOUNTER — Other Ambulatory Visit: Payer: Self-pay

## 2023-12-27 ENCOUNTER — Emergency Department (HOSPITAL_COMMUNITY): Payer: Medicare Other

## 2023-12-27 ENCOUNTER — Emergency Department (HOSPITAL_COMMUNITY)
Admission: EM | Admit: 2023-12-27 | Discharge: 2023-12-27 | Disposition: A | Discharge status: Home / Self Care | Payer: Medicare Other | Attending: Emergency Medicine | Admitting: Emergency Medicine

## 2023-12-27 DIAGNOSIS — R600 Localized edema: Secondary | ICD-10-CM | POA: Diagnosis not present

## 2023-12-27 DIAGNOSIS — R059 Cough, unspecified: Secondary | ICD-10-CM | POA: Diagnosis not present

## 2023-12-27 DIAGNOSIS — Z7901 Long term (current) use of anticoagulants: Secondary | ICD-10-CM | POA: Diagnosis not present

## 2023-12-27 DIAGNOSIS — R0600 Dyspnea, unspecified: Secondary | ICD-10-CM | POA: Diagnosis present

## 2023-12-27 DIAGNOSIS — M7989 Other specified soft tissue disorders: Secondary | ICD-10-CM | POA: Insufficient documentation

## 2023-12-27 DIAGNOSIS — R0602 Shortness of breath: Secondary | ICD-10-CM | POA: Insufficient documentation

## 2023-12-27 LAB — CBC
HCT: 48.5 % (ref 39.0–52.0)
Hemoglobin: 15.9 g/dL (ref 13.0–17.0)
MCH: 29.8 pg (ref 26.0–34.0)
MCHC: 32.8 g/dL (ref 30.0–36.0)
MCV: 90.8 fL (ref 80.0–100.0)
Platelets: 175 10*3/uL (ref 150–400)
RBC: 5.34 MIL/uL (ref 4.22–5.81)
RDW: 13.4 % (ref 11.5–15.5)
WBC: 7.8 10*3/uL (ref 4.0–10.5)
nRBC: 0 % (ref 0.0–0.2)

## 2023-12-27 LAB — BASIC METABOLIC PANEL
Anion gap: 9 (ref 5–15)
BUN: 27 mg/dL — ABNORMAL HIGH (ref 8–23)
CO2: 21 mmol/L — ABNORMAL LOW (ref 22–32)
Calcium: 9.4 mg/dL (ref 8.9–10.3)
Chloride: 109 mmol/L (ref 98–111)
Creatinine, Ser: 1.54 mg/dL — ABNORMAL HIGH (ref 0.61–1.24)
GFR, Estimated: 50 mL/min — ABNORMAL LOW (ref >60)
Glucose, Bld: 107 mg/dL — ABNORMAL HIGH (ref 70–99)
Potassium: 4.1 mmol/L (ref 3.5–5.1)
Sodium: 139 mmol/L (ref 135–145)

## 2023-12-27 LAB — BRAIN NATRIURETIC PEPTIDE: B Natriuretic Peptide: 29 pg/mL (ref 0.0–100.0)

## 2023-12-27 LAB — TROPONIN I (HIGH SENSITIVITY): Troponin I (High Sensitivity): 4 ng/L (ref <18)

## 2023-12-27 LAB — D-DIMER, QUANTITATIVE: D-Dimer, Quant: 0.63 ug{FEU}/mL — ABNORMAL HIGH (ref 0.00–0.50)

## 2023-12-27 MED ORDER — ALBUTEROL SULFATE (2.5 MG/3ML) 0.083% IN NEBU: 2.5000 mg | INHALATION_SOLUTION | Freq: Four times a day (QID) | RESPIRATORY_TRACT | 0 refills | Status: AC | PRN

## 2023-12-27 MED ORDER — IOHEXOL 350 MG/ML SOLN
75.0000 mL | Freq: Once | INTRAVENOUS | Status: AC | PRN
  Administered 2023-12-27: 75 mL via INTRAVENOUS

## 2023-12-27 NOTE — ED Provider Notes (Signed)
 [place] EMERGENCY DEPARTMENT AT [facility] Provider Note   CSN: [id number] Arrival date & time: 12/27/23  1941     History  Chief Complaint  Patient presents with   Shortness of Breath    Aaron Clarke is a 68 y.o. male.   Shortness of Breath Patient has shortness of breath.  Is had over the last couple weeks.  No relief with his breathing treatments.  Has been on steroids.  Reportedly had negative COVID and flu testing this week.  History of pulmonary embolism.  No longer on Eliquis  however.  Has swelling in both his legs.  Reportedly gets more swelling.  States he could not walk across the room now.  States he had some cough with some white sputum production.     Home Medications Prior to Admission medications   Medication Sig Start Date End Date Taking? Authorizing Provider  acetaminophen  (TYLENOL ) 500 MG tablet Take 500 mg by mouth every 6 (six) hours as needed for mild pain or moderate pain. Patient not taking: Reported on 11/01/2023    [provider]  albuterol  (PROVENTIL ) (2.5 MG/3ML) 0.083% nebulizer solution Take 3 mLs (2.5 mg total) by nebulization every 6 (six) hours as needed for wheezing or shortness of breath. 12/27/23   Patsey Lot, MD  albuterol  (VENTOLIN  HFA) 108 [phone] Base) MCG/ACT inhaler Inhale 1-2 puffs into the lungs every 6 (six) hours as needed for wheezing or shortness of breath. Patient not taking: Reported on 11/01/2023 08/20/22   Billy Asberry FALCON, PA-C  apixaban  (ELIQUIS ) 5 MG TABS tablet Take 1 tablet (5 mg total) by mouth 2 (two) times daily. Patient not taking: Reported on 12/17/2023 11/27/22   Britta King, MD  celecoxib  (CELEBREX ) 200 MG capsule Take 1 capsule (200 mg total) by mouth 2 (two) times daily between meals as needed. 12/03/23   Vernetta Lonni GRADE, MD  Cyanocobalamin (VITAMIN B-12 PO) Take 1 tablet by mouth daily. Patient not taking: Reported on 11/01/2023    [provider]  gabapentin  (NEURONTIN )  100 MG capsule Take 1 capsule (100 mg total) by mouth 2 (two) times daily. Patient not taking: Reported on 11/01/2023 10/11/22   Kaur, Charanpreet, NP  Lactobacillus-Inulin (PROBIOTIC DIGESTIVE SUPPORT PO) Take 1 tablet by mouth daily. Patient not taking: Reported on 11/01/2023    [provider]  lidocaine  4 % Place 1 patch onto the skin daily. Patient not taking: Reported on 11/01/2023 10/03/22   Renae Bernarda HERO, PA-C  methocarbamol  (ROBAXIN ) 500 MG tablet Take 1 tablet (500 mg total) by mouth every 6 (six) hours as needed. 12/03/23   Vernetta Lonni GRADE, MD  valsartan -hydrochlorothiazide  (DIOVAN -HCT) 160-12.5 MG tablet Take 1 tablet by mouth daily. 11/27/22   Britta King, MD  VITAMIN D PO Take by mouth. Patient not taking: Reported on 12/17/2023    [provider]      Allergies    Patient has no known allergies.    Review of Systems   Review of Systems  Respiratory:  Positive for shortness of breath.     Physical Exam Updated Vital Signs BP (!) 142/78 (BP Location: Right Arm)   Pulse 88   Temp 98.9 F (37.2 C) (Oral)   Resp (!) 24   Ht 6' (1.829 m)   Wt 132.6 kg   SpO2 94%   BMI 39.66 kg/m  Physical Exam Vitals and nursing note reviewed.  Cardiovascular:     Rate and Rhythm: Normal rate and regular rhythm.  Pulmonary:  Comments: Somewhat diffuse harsh breath sounds. Chest:     Chest wall: No tenderness.  Musculoskeletal:     Right lower leg: No edema.     Left lower leg: Edema present.  Skin:    Capillary Refill: Capillary refill takes less than 2 seconds.  Neurological:     Mental Status: He is alert and oriented to person, place, and time.     ED Results / Procedures / Treatments   Labs (all labs ordered are listed, but only abnormal results are displayed) Labs Reviewed  BASIC METABOLIC PANEL - Abnormal; Notable for the following components:      Result Value   CO2 21 (*)    Glucose, Bld 107 (*)    BUN 27 (*)    Creatinine,  Ser 1.54 (*)    GFR, Estimated 50 (*)    All other components within normal limits  D-DIMER, QUANTITATIVE - Abnormal; Notable for the following components:   D-Dimer, Quant 0.63 (*)    All other components within normal limits  CBC  BRAIN NATRIURETIC PEPTIDE  TROPONIN I (HIGH SENSITIVITY)  TROPONIN I (HIGH SENSITIVITY)    EKG EKG Interpretation Date/Time:  Thursday December 27 2023 19:59:38 EST Ventricular Rate:  88 PR Interval:  156 QRS Duration:  110 QT Interval:  370 QTC Calculation: 447 R Axis:   76  Text Interpretation: Normal sinus rhythm Normal ECG When compared with ECG of 27-Oct-2023 08:49, No significant change since last tracing Confirmed by Patsey Lot [id number]) on 12/27/2023 11:20:56 PM  Radiology CT Angio Chest PE W and/or Wo Contrast Result Date: 12/27/2023 CLINICAL DATA:  Pulmonary embolus suspected with low to intermediate probability. Positive D-dimer. Shortness of breath starting 2 weeks ago. No relief with treatment. EXAM: CT ANGIOGRAPHY CHEST WITH CONTRAST TECHNIQUE: Multidetector CT imaging of the chest was performed using the standard protocol during bolus administration of intravenous contrast. Multiplanar CT image reconstructions and MIPs were obtained to evaluate the vascular anatomy. RADIATION DOSE REDUCTION: This exam was performed according to the departmental dose-optimization program which includes automated exposure control, adjustment of the mA and/or kV according to patient size and/or use of iterative reconstruction technique. CONTRAST:  75mL OMNIPAQUE  IOHEXOL  350 MG/ML SOLN COMPARISON:  Chest radiograph 12/27/2023.  CT chest 10/23/2022 FINDINGS: Cardiovascular: Technically adequate study with good opacification of the central and segmental pulmonary arteries. Mild motion artifact. No focal filling defects are identified. No evidence of significant pulmonary embolus. Normal heart size. No pericardial effusions. Normal caliber thoracic aorta. No aortic  dissection. Great vessel origins are patent. Mediastinum/Nodes: Esophagus is decompressed. Thyroid gland is unremarkable. Calcified lymph nodes in the left hilum and mediastinum consistent with postinflammatory changes. No significant lymphadenopathy. Lungs/Pleura: Motion artifact limits evaluation. Lungs appear clear. No pleural effusion or pneumothorax. Upper Abdomen: Calcified granulomas in the liver and spleen. Surgical absence of the gallbladder. 3 mm stone in the right kidney. No visible hydronephrosis. 1.3 cm diameter circumscribed hyperenhancing lesion in the body of the pancreas. This has been present on prior studies dating back to 05/23/2011. Long-term stability suggests benign etiology. The morphologic appearance suggests a neuroendocrine tumor. Consider MRI for further characterization if clinically indicated. Musculoskeletal: Degenerative changes in the spine. No acute bony abnormalities. Review of the MIP images confirms the above findings. IMPRESSION: 1. No evidence of significant pulmonary embolus. 2. No evidence of active pulmonary disease. 3. Postinflammatory calcifications. 4. 1.3 cm diameter hyperenhancing lesion in the body of the pancreas. Although this is been present on prior studies dating back  to 05/23/2011 suggesting benign etiology, the morphologic appearance suggests a neuroendocrine tumor. Consider nonemergent MRI for further characterization. Electronically Signed   By: Elsie Gravely M.D.   On: 12/27/2023 23:10   DG Chest 2 View Result Date: 12/27/2023 CLINICAL DATA:  Coughing and shortness of breath. History of asthma. EXAM: CHEST - 2 VIEW COMPARISON:  Portable chest 10/27/2019 FINDINGS: The heart size and mediastinal contours are within normal limits. There are calcified left hilar lymph nodes. Both lungs are clear. The visualized skeletal structures are intact, with osteopenia and extensive bridging enthesopathy of the thoracic spine, chronic healed right clavicle fracture.  IMPRESSION: No evidence of acute chest disease. Old granulomatous disease. Electronically Signed   By: Francis Quam M.D.   On: 12/27/2023 20:31    Procedures Procedures    Medications Ordered in ED Medications  iohexol  (OMNIPAQUE ) 350 MG/ML injection 75 mL (75 mLs Intravenous Contrast Given 12/27/23 2256)    ED Course/ Medical Decision Making/ A&P                                 Medical Decision Making Amount and/or Complexity of Data Reviewed Labs: ordered. Radiology: ordered.   Patient is short of breath over the last weeks.  Cough occasionally.  Reported negative flu and COVID test.  No relief with his inhaler.  Chest x-ray reassuring.  However has had PEs and is off his anticoagulation.  CTA done and does not show pulmonary embolism or no severe active pulmonary disease.   Patient states he is reassured after negative workup.  Somewhat eager to go home.  Request refill of his albuterol .  Will give pulmonary follow-up since has had persistent difficulty breathing.         Final Clinical Impression(s) / ED Diagnoses Final diagnoses:  Dyspnea, unspecified type    Rx / DC Orders ED Discharge Orders          Ordered    albuterol  (PROVENTIL ) (2.5 MG/3ML) 0.083% nebulizer solution  Every 6 hours PRN        12/27/23 2337              Patsey Lot, MD 12/27/23 2339

## 2023-12-27 NOTE — ED Triage Notes (Signed)
 Patient from home for shortness of breath that started approx 2 weeks ago. Has been seen prior and giving albuterol  and steroids, with no relief. Patient was taking albuterol  treatments at home, but it gives him chest pain. Patient reports dyspnea with exertion. Was seen by PCP last week and told lung sounds were clear. Wife reports patient is starting to retain fluid, that the patient looks like he has gained 50lbs at the end of the day. Patient has not been taking his BP and fluid medicine consistently until recently. Upon arrival to ER, patient is alert and oriented, ambu

## 2023-12-31 ENCOUNTER — Encounter: Payer: Self-pay | Admitting: Orthopaedic Surgery

## 2023-12-31 ENCOUNTER — Ambulatory Visit (INDEPENDENT_AMBULATORY_CARE_PROVIDER_SITE_OTHER): Payer: Medicare Other | Admitting: Orthopaedic Surgery

## 2023-12-31 VITALS — Wt 295.0 lb

## 2023-12-31 DIAGNOSIS — M1611 Unilateral primary osteoarthritis, right hip: Secondary | ICD-10-CM | POA: Diagnosis not present

## 2023-12-31 DIAGNOSIS — M25551 Pain in right hip: Secondary | ICD-10-CM

## 2023-12-31 NOTE — Progress Notes (Unsigned)
Aaron Clarke, male    DOB: January 12, 1956    MRN: [id number]   Brief patient profile:  18  yowm  quit smoking 1989 told had asthma as child  referred to pulmonary clinic in [place]  01/02/2024 by EDP for eval of doe onset sev  years before covid previously treated with as needed saba but much worse since Jan 2025 / needs clearance for hip surgery by Dr Dorris Fetch into new house/ new construction [place] 2024  [place] a [place]   Seen in ER 1st time one month p moved into new house   Has h/o PE   [place] > stopped p 4 month rx   History of Present Illness  01/02/2024  Pulmonary/ 1st office eval/ Milia Warth / [place] [facility]  Chief Complaint  Patient presents with   Consult   Shortness of Breath   Dyspnea:  walking thru [employer] no problem slow pace /also limited by R hip pain  Cough: white mucus, assoc with  nasal congestion  Sleep: flat bed 2 pillows does ok hs but hears wheezing when supine x years  SABA use: 2 x daily hfa/ neb once a day 02: none  LDSCT:n/a    No obvious day to day or daytime pattern/variability or assoc   purulent sputum or mucus plugs or hemoptysis or cp or chest tightness,   or overt sinus or hb symptoms.    Also denies any obvious fluctuation of symptoms with weather or environmental changes or other aggravating or alleviating factors except as outlined above   No unusual exposure hx or h/o childhood pna  or knowledge of premature birth.  Current Allergies, Complete Past Medical History, Past Surgical History, Family History, and Social History were reviewed in [employer] record.  ROS  The following are not active complaints unless bolded Hoarseness, sore throat, dysphagia, dental problems, itching, sneezing,  nasal congestion or discharge of excess mucus or purulent secretions, ear ache,   fever, chills, sweats, unintended wt loss or wt gain, classically pleuritic or exertional cp,  orthopnea pnd or arm/hand swelling  or leg  swelling, presyncope, palpitations, abdominal pain, anorexia, nausea, vomiting, diarrhea  or change in bowel habits or change in bladder habits, change in stools or change in urine, dysuria, hematuria,  rash, arthralgias, visual complaints, headache, numbness, weakness or ataxia or problems with walking or coordination,  change in mood or  memory.            Outpatient Medications Prior to Visit  Medication Sig Dispense Refill   albuterol (PROVENTIL) (2.5 MG/3ML) 0.083% nebulizer solution Take 3 mLs (2.5 mg total) by nebulization every 6 (six) hours as needed for wheezing or shortness of breath. 75 mL 0   albuterol (VENTOLIN HFA) 108 (90 Base) MCG/ACT inhaler Inhale 1-2 puffs into the lungs every 6 (six) hours as needed for wheezing or shortness of breath. 8 g 0   celecoxib (CELEBREX) 200 MG capsule Take 1 capsule (200 mg total) by mouth 2 (two) times daily between meals as needed. 60 capsule 1   methocarbamol (ROBAXIN) 500 MG tablet Take 1 tablet (500 mg total) by mouth every 6 (six) hours as needed. 40 tablet 1   valsartan-hydrochlorothiazide (DIOVAN-HCT) 160-12.5 MG tablet Take 1 tablet by mouth daily. 90 tablet 3   acetaminophen (TYLENOL) 500 MG tablet Take 500 mg by mouth every 6 (six) hours as needed for mild pain or moderate pain. (Patient not taking: Reported on 11/01/2023)  apixaban (ELIQUIS) 5 MG TABS tablet Take 1 tablet (5 mg total) by mouth 2 (two) times daily. (Patient not taking: Reported on 12/17/2023) 60 tablet 4   Cyanocobalamin (VITAMIN B-12 PO) Take 1 tablet by mouth daily. (Patient not taking: Reported on 11/01/2023)     gabapentin (NEURONTIN) 100 MG capsule Take 1 capsule (100 mg total) by mouth 2 (two) times daily. (Patient not taking: Reported on 11/01/2023) 90 capsule 3   Lactobacillus-Inulin (PROBIOTIC DIGESTIVE SUPPORT PO) Take 1 tablet by mouth daily. (Patient not taking: Reported on 11/01/2023)     lidocaine 4 % Place 1 patch onto the skin daily. (Patient not taking:  Reported on 11/01/2023) 15 patch 0   VITAMIN D PO Take by mouth. (Patient not taking: Reported on 12/17/2023)     diclofenac Sodium (VOLTAREN) 1 % topical gel 2 g      ofloxacin (FLOXIN) 0.3 % OTIC (EAR) solution 5 drop      No facility-administered medications prior to visit.    Past Medical History:  Diagnosis Date   Asthma    Cholelithiases    Chronic kidney disease    Kidney stone 04/2011   Family history of breast cancer    mother      Objective:     BP 118/87   Pulse (!) 118   Ht 6' (1.829 m)   Wt 291 lb (132 kg)   SpO2 91% Comment: room air  BMI 39.47 kg/m   SpO2: 91 % (room air) note resting pulse 118   Amb pleasant obese wm nad    HEENT : Oropharynx  clear      Nasal turbinates nl    NECK :  without  apparent JVD/ palpable Nodes/TM    LUNGS: no acc muscle use,  Nl contour chest which is clear to A and P bilaterally without cough on insp or exp maneuvers   CV:  RRR  no s3 or murmur or increase in P2, and no edema   ABD:  quite obese soft and nontender   MS:  Gait nl   ext warm without deformities Or obvious joint restrictions  calf tenderness, cyanosis or clubbing    SKIN: warm and dry without lesions    NEURO:  alert, approp, nl sensorium with  no motor or cerebellar deficits apparent.      I personally reviewed images and agree with radiology impression as follows:   Chest CTa   12/27/23  1. No evidence of significant pulmonary embolus. 2. No evidence of active pulmonary disease.   Labs ordered/ reviewed:      Chemistry      Component Value Date/Time   NA 139 12/27/2023 2027   K 4.1 12/27/2023 2027   CL 109 12/27/2023 2027   CO2 21 (L) 12/27/2023 2027   BUN 27 (H) 12/27/2023 2027   CREATININE 1.54 (H) 12/27/2023 2027      Component Value Date/Time   CALCIUM 9.4 12/27/2023 2027   ALKPHOS 47 10/27/2023 1017   AST 21 10/27/2023 1017   ALT 25 10/27/2023 1017   BILITOT 0.8 10/27/2023 1017        Lab Results  Component Value Date    WBC 7.8 12/27/2023   HGB 15.9 12/27/2023   HCT 48.5 12/27/2023   MCV 90.8 12/27/2023   PLT 175 12/27/2023     Lab Results  Component Value Date   DDIMER 0.62 (H) 01/02/2024        D dimer  0.49                                  10/27/23                      BNP  12/27/23   = 29         Assessment   No problem-specific Assessment & Plan notes found for this encounter.     Sandrea Hughs, MD 01/02/2024

## 2023-12-31 NOTE — Progress Notes (Signed)
 The patient is returning with again for follow-up as it relates to his need for a right total hip arthroplasty.  He is 68 years old.  He is morbidly obese and his BMI today is 39.66.  His blood glucoses always been under good control and his last hemoglobin A1c recently was 5.7.  What is been more of a concern is when he does get short of breath.  He has been seen by his primary care physician and all medications for asthma.  However he was in the emergency room last week with shortness of breath.  The workup was negative and CT scan of his chest was negative.  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  He is now off of Eliquis .  I just saw him a month ago as well.  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  He did let me know today that he has a pulmonology appointment in 2 days.  I gave him my card.  He understands that we can schedule him for a total hip arthroplasty for the right hip and this can be done under spinal anesthesia but we really do need clearance from the lung specialist with pulmonology given his chronic shortness of breath.  Everything else has been looking okay in terms of his health status overall  Counseled again about weight loss.  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  We will work on getting him scheduled for surgery and we will fill out a surgery scheduling sheet and await pulmonology clearance for a right total hip arthroplasty.

## 2024-01-02 ENCOUNTER — Encounter: Payer: Self-pay | Admitting: Internal Medicine

## 2024-01-02 ENCOUNTER — Other Ambulatory Visit: Payer: Self-pay | Admitting: Orthopaedic Surgery

## 2024-01-02 ENCOUNTER — Ambulatory Visit (INDEPENDENT_AMBULATORY_CARE_PROVIDER_SITE_OTHER): Payer: Medicare Other | Admitting: Internal Medicine

## 2024-01-02 VITALS — BP 118/87 | HR 115 | Ht 72.0 in | Wt 291.0 lb

## 2024-01-02 DIAGNOSIS — I10 Essential (primary) hypertension: Secondary | ICD-10-CM | POA: Diagnosis not present

## 2024-01-02 DIAGNOSIS — J4489 Other specified chronic obstructive pulmonary disease: Secondary | ICD-10-CM | POA: Insufficient documentation

## 2024-01-02 DIAGNOSIS — R0609 Other forms of dyspnea: Secondary | ICD-10-CM | POA: Diagnosis not present

## 2024-01-02 MED ORDER — BUDESONIDE-FORMOTEROL FUMARATE 160-4.5 MCG/ACT IN AERO: INHALATION_SPRAY | RESPIRATORY_TRACT | 11 refills | Status: DC

## 2024-01-02 MED ORDER — BISOPROLOL FUMARATE 5 MG PO TABS: 5.0000 mg | ORAL_TABLET | Freq: Every day | ORAL | 11 refills | Status: DC

## 2024-01-02 NOTE — Patient Instructions (Addendum)
Plan A = Automatic = Always=    Symbicort 160 Take 2 puffs first thing in am and then another 2 puffs about 12 hours later.    Work on inhaler technique:  relax and gently blow all the way out then take a nice smooth full deep breath back in, triggering the inhaler at same time you start breathing in.  Hold breath in for at least  5 seconds if you can. Blow out symbicort  thru nose. Rinse and gargle with water when done.  If mouth or throat bother you at all,  try brushing teeth/gums/tongue with arm and hammer toothpaste/ make a slurry and gargle and spit out.       Plan B = Backup (to supplement plan A, not to replace it) Only use your albuterol inhaler as a rescue medication to be used if you can't catch your breath by resting or doing a relaxed purse lip breathing pattern.  - The less you use it, the better it will work when you need it. - Ok to use the inhaler up to 2 puffs  every 4 hours if you must but call for appointment if use goes up over your usual need - Don't leave home without it !!  (think of it like the spare tire for your car)   Plan C = Crisis (instead of Plan B but only if Plan B stops working) - only use your albuterol nebulizer if you first try Plan B and it fails to help > ok to use the nebulizer up to every 4 hours but if start needing it regularly call for immediate appointment  Stop valsartan for now   Start bisoprolol 5 mg daily in its place   Prednisone 10 mg take  4 each am x 2 days,   2 each am x 2 days,  1 each am x 2 days and stop   Please remember to go to the lab department   for your tests - we will call you with the results when they are available.  Please schedule a follow up office visit in 2 weeks, sooner if needed  with all medications /inhalers/ solutions in hand so we can verify exactly what you are taking. This includes all medications from all doctors and over the counters

## 2024-01-03 LAB — D-DIMER, QUANTITATIVE: D-DIMER: 0.62 mg{FEU}/L — ABNORMAL HIGH (ref 0.00–0.49)

## 2024-01-04 ENCOUNTER — Encounter: Payer: Self-pay | Admitting: Internal Medicine

## 2024-01-04 NOTE — Assessment & Plan Note (Signed)
  When respiratory symptoms begin or become refractory well after a patient reports complete smoking cessation,  Especially when this wasn't the case while they were smoking, a red flag is raised based on the work of Dr Primitivo Gauze which states:  if you quit smoking when your best day FEV1 is still well preserved it is highly unlikely you will progress to severe disease.  That is to say, once the smoking stops,  the symptoms should not suddenly erupt or markedly worsen.  If so, the differential diagnosis should include  obesity/deconditioning,  LPR/Reflux/Aspiration syndromes,  PE, occult CHF, or  especially side effect of medications commonly used in this population (none of the usual suspects listed  Already ruled out PE but he is at risk so needs venous dopplers preop despite relatively low Ddimer.

## 2024-01-04 NOTE — Assessment & Plan Note (Addendum)
Change arb to bisoprolol 5 mg daily due to tachycardia from Beta agonists   Recheck in 2 weeks with all meds in hand using a trust but verify approach to confirm accurate Medication  Reconciliation The principal here is that until we are certain that the  patients are doing what we've asked, it makes no sense to ask them to do more.    Each maintenance medication was reviewed in detail including emphasizing most importantly the difference between maintenance and prns and under what circumstances the prns are to be triggered using an action plan format where appropriate.  Total time for H and P, chart review, counseling, reviewing hfa/neb device(s) ,   and generating customized AVS unique to this office visit / same day charting = 45 min new pt eval

## 2024-01-04 NOTE — Assessment & Plan Note (Signed)
Onset Sept 2024 s/p smoking cessation 1989  - 01/02/2024  After extensive coaching inhaler device,  effectiveness =   60% (short Ti)  - Allergy screen 01/02/2024 >  Eos 0.3  -  Alpha one AT phenotype 01/02/2024 >>>  He is way over using saba since 1st of the year ? Why  > causing resting tachycardia and risking tachyphylaxis so rec   Plan A = Automatic = Always=    Symbicort 160 Take 2 puffs first thing in am and then another 2 puffs about 12 hours later.    Work on inhaler technique:  relax and gently blow all the way out then take a nice smooth full deep breath back in, triggering the inhaler at same time you start breathing in.  Hold breath in for at least  5 seconds if you can. Blow out symbicort  thru nose. Rinse and gargle with water when done.  If mouth or throat bother you at all,  try brushing teeth/gums/tongue with arm and hammer toothpaste/ make a slurry and gargle and spit out.       Plan B = Backup (to supplement plan A, not to replace it) Only use your albuterol inhaler as a rescue medication to be used if you can't catch your breath by resting or doing a relaxed purse lip breathing pattern.  - The less you use it, the better it will work when you need it. - Ok to use the inhaler up to 2 puffs  every 4 hours if you must but call for appointment if use goes up over your usual need - Don't leave home without it !!  (think of it like the spare tire for your car)   Plan C = Crisis (instead of Plan B but only if Plan B stops working) - only use your albuterol nebulizer if you first try Plan B  Return in 2 weeks to see if can clear for hip surgery

## 2024-01-10 ENCOUNTER — Telehealth: Payer: Self-pay

## 2024-01-10 NOTE — Telephone Encounter (Signed)
I called patient to discuss scheduling right THA.  Patient did not answer and voice mail was full.  Will try again later.

## 2024-01-14 LAB — CBC WITH DIFFERENTIAL/PLATELET
Basophils Absolute: 0.1 10*3/uL (ref 0.0–0.2)
Basos: 1 %
EOS (ABSOLUTE): 0.3 10*3/uL (ref 0.0–0.4)
Eos: 3 %
Hematocrit: 51.9 % — ABNORMAL HIGH (ref 37.5–51.0)
Hemoglobin: 17.5 g/dL (ref 13.0–17.7)
Immature Grans (Abs): 0.1 10*3/uL (ref 0.0–0.1)
Immature Granulocytes: 1 %
Lymphocytes Absolute: 1.6 10*3/uL (ref 0.7–3.1)
Lymphs: 15 %
MCH: 29.9 pg (ref 26.6–33.0)
MCHC: 33.7 g/dL (ref 31.5–35.7)
MCV: 89 fL (ref 79–97)
Monocytes Absolute: 0.7 10*3/uL (ref 0.1–0.9)
Monocytes: 7 %
Neutrophils Absolute: 7.8 10*3/uL — ABNORMAL HIGH (ref 1.4–7.0)
Neutrophils: 73 %
Platelets: 209 10*3/uL (ref 150–450)
RBC: 5.85 x10E6/uL — ABNORMAL HIGH (ref 4.14–5.80)
RDW: 13 % (ref 11.6–15.4)
WBC: 10.6 10*3/uL (ref 3.4–10.8)

## 2024-01-14 LAB — ALPHA-1-ANTITRYPSIN PHENOTYP: A-1 Antitrypsin: 173 mg/dL (ref 101–187)

## 2024-01-14 LAB — IGE: IgE (Immunoglobulin E), Serum: 183 [IU]/mL (ref 6–495)

## 2024-01-15 NOTE — Progress Notes (Unsigned)
 Aaron Clarke, male    DOB: [birth year]    MRN: [id number]   Brief patient profile:  42  yowm  quit smoking 1989 told had asthma as child  referred to pulmonary clinic in [place]  01/02/2024 by EDP for eval of doe onset sev  years before covid previously treated with as needed saba but much worse since Jan 2025 / needs clearance for hip surgery by Dr Dorris Fetch into new house[place]  [place]   Seen in ER 1st time one month p moved into new house   Has h/o PE   [place] > stopped p 4 month rx   History of Present Illness  01/02/2024  Pulmonary/ 1st office eval/ Aaron Clarke / [place] [facility]  Chief Complaint  Patient presents with   Consult   Shortness of Breath   Dyspnea:  walking thru [employer] no problem slow pace /also limited by R hip pain  Cough: white mucus, assoc with  nasal congestion  Sleep: flat bed 2 pillows does ok hs but hears wheezing when supine x years  SABA use: 2 x daily hfa/ neb once a day 02: none  LDSCT:n/a Rec Plan A = Automatic = Always=    Symbicort 160 Take 2 puffs first thing in am and then another 2 puffs about 12 hours later.  Work on inhaler technique:   Plan B = Backup (to supplement plan A, not to replace it) Only use your albuterol inhaler as a rescue medication Plan C = Crisis (instead of Plan B but only if Plan B stops working) - only use your albuterol nebulizer if you first try Plan B Stop valsartan for now  Start bisoprolol 5 mg daily in its place  due to pulse 118 at rest  Prednisone 10 mg take  4 each am x 2 days,   2 each am x 2 days,  1 each am x 2 days and stop  Please schedule a follow up office visit in 2 weeks, sooner if needed  with all medications /inhalers/ solutions in hand    -  Allergy screen 01/02/2024 >  Eos 0.3   IgE  183 -  Alpha one AT phenotype 01/02/2024 >>>  MM  173   01/16/2024  f/u ov/[facility]/Aaron Clarke re: AB maint on symbicort  did not bring meds  Chief Complaint  Patient  presents with   Follow-up    2 week  follow up    Dyspnea:  slowed by hip not sob Cough: no problem  Sleeping: no longer wheeze/ lying flat ok s  resp cc  SABA use: none  02: none   Lung cancer screening: see CTa 12/27/23 no nodules    No obvious day to day or daytime variability or assoc excess/ purulent sputum or mucus plugs or hemoptysis or cp or chest tightness, subjective wheeze or overt sinus or hb symptoms.    Also denies any obvious fluctuation of symptoms with weather or environmental changes or other aggravating or alleviating factors except as outlined above   No unusual exposure hx or h/o childhood pna  or knowledge of premature birth.  Current Allergies, Complete Past Medical History, Past Surgical History, Family History, and Social History were reviewed in [employer] record.  ROS  The following are not active complaints unless bolded Hoarseness, sore throat, dysphagia, dental problems, itching, sneezing,  nasal congestion or discharge of excess mucus or purulent secretions, ear ache,  fever, chills, sweats, unintended wt loss or wt gain, classically pleuritic or exertional cp,  orthopnea pnd or arm/hand swelling  or leg swelling, presyncope, palpitations, abdominal pain, anorexia, nausea, vomiting, diarrhea  or change in bowel habits or change in bladder habits, change in stools or change in urine, dysuria, hematuria,  rash, arthralgias, visual complaints, headache, numbness, weakness or ataxia or problems with walking or coordination,  change in mood or  memory.        Current Meds  Medication Sig   bisoprolol (ZEBETA) 5 MG tablet Take 1 tablet (5 mg total) by mouth daily.   budesonide-formoterol (SYMBICORT) 160-4.5 MCG/ACT inhaler Take 2 puffs first thing in am and then another 2 puffs about 12 hours later.   celecoxib (CELEBREX) 200 MG capsule TAKE 1 CAPSULE (200 MG TOTAL) BY MOUTH 2 (TWO) TIMES DAILY BETWEEN MEALS AS NEEDED.   methocarbamol  (ROBAXIN) 500 MG tablet Take 1 tablet (500 mg total) by mouth every 6 (six) hours as needed.            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  No bruits or organomegaly appreciated   MS:  slow slt awkward  gait/ ext warm without deformities Or obvious joint restrictions  calf tenderness, cyanosis or clubbing     SKIN: warm and dry without lesions    NEURO:  alert, approp, nl sensorium with  no motor or cerebellar deficits apparent.                 Assessment

## 2024-01-16 ENCOUNTER — Encounter: Payer: Self-pay | Admitting: Orthopaedic Surgery

## 2024-01-16 ENCOUNTER — Encounter: Payer: Self-pay | Admitting: Internal Medicine

## 2024-01-16 ENCOUNTER — Ambulatory Visit: Payer: Medicare Other | Admitting: Internal Medicine

## 2024-01-16 ENCOUNTER — Ambulatory Visit (INDEPENDENT_AMBULATORY_CARE_PROVIDER_SITE_OTHER): Payer: Medicare Other | Admitting: Internal Medicine

## 2024-01-16 VITALS — BP 157/93 | HR 64 | Ht 72.0 in | Wt 300.8 lb

## 2024-01-16 DIAGNOSIS — J4489 Other specified chronic obstructive pulmonary disease: Secondary | ICD-10-CM

## 2024-01-16 DIAGNOSIS — I10 Essential (primary) hypertension: Secondary | ICD-10-CM | POA: Diagnosis not present

## 2024-01-16 NOTE — Patient Instructions (Addendum)
 Reduce bisoprolol to one half tablet each am   Restart valsartan- hct one pill daily - call me if blood pressure not satisfactory    Work on inhaler technique:  relax and gently blow all the way out then take a nice smooth full deep breath back in, triggering the inhaler at same time you start breathing in.  Hold breath in for at least  5 seconds if you can. Blow out symbicort  thru nose. Rinse and gargle with water when done.  If mouth or throat bother you at all,  try brushing teeth/gums/tongue with arm and hammer toothpaste/ make a slurry and gargle and spit out.   You are cleared for R hip surgery   Please schedule a follow up office visit in 6 weeks, call sooner if needed with all medications /inhalers/ solutions in hand so we can verify exactly what you are taking. This includes all medications from all doctors and over the counters

## 2024-01-16 NOTE — Assessment & Plan Note (Signed)
 Change arb to bisoprolol 5 mg daily due to tachycardia from Beta agonists   Pulse down to mid 60s on much lower amt of B agonists and bisoprolol but bp not adequate off valsartan/hydrochlorothiazide so rec resume prior dose of the latter and reuce the former to bisopolol 5 mg one half daily and monitor bp/ pulse and f/u prn with PCP  Cleared for hip surgery          Each maintenance medication was reviewed in detail including emphasizing most importantly the difference between maintenance and prns and under what circumstances the prns are to be triggered using an action plan format where appropriate.  Total time for H and P, chart review, counseling, reviewing hfa  device(s) and generating customized AVS unique to this office visit / same day charting = 25 min

## 2024-01-16 NOTE — Assessment & Plan Note (Signed)
 Onset Sept 2024 s/p smoking cessation 1989/MM  - 01/02/2024  After extensive coaching inhaler device,  effectiveness =   60% (short Ti)  - Allergy screen 01/02/2024 >  Eos 0.3   IgE  183 -  Alpha one AT phenotype 01/02/2024 >>>  MM  173  - 01/16/2024  After extensive coaching inhaler device,  effectiveness =    80%with hfa > continue symbicort 160 2bid   All goals of chronic asthma control met including optimal function and elimination of symptoms with minimal need for rescue therapy.  Contingencies discussed in full including contacting this office immediately if not controlling the symptoms using the rule of two's.

## 2024-01-28 ENCOUNTER — Institutional Professional Consult (permissible substitution): Payer: Medicare Other | Admitting: Internal Medicine

## 2024-01-29 ENCOUNTER — Encounter: Payer: Self-pay | Admitting: Family Medicine

## 2024-01-29 ENCOUNTER — Ambulatory Visit (INDEPENDENT_AMBULATORY_CARE_PROVIDER_SITE_OTHER): Payer: Medicare Other | Admitting: Family Medicine

## 2024-01-29 VITALS — BP 126/78 | HR 70 | Temp 97.7°F | Resp 12 | Ht 72.0 in | Wt 292.2 lb

## 2024-01-29 DIAGNOSIS — Z0000 Encounter for general adult medical examination without abnormal findings: Secondary | ICD-10-CM

## 2024-01-29 NOTE — Patient Instructions (Signed)
 Health Maintenance, Male  Adopting a healthy lifestyle and getting preventive care are important in promoting health and wellness. Ask your health care provider about:  The right schedule for you to have regular tests and exams.  Things you can do on your own to prevent diseases and keep yourself healthy.  What should I know about diet, weight, and exercise?  Eat a healthy diet    Eat a diet that includes plenty of vegetables, fruits, low-fat dairy products, and lean protein.  Do not eat a lot of foods that are high in solid fats, added sugars, or sodium.  Maintain a healthy weight  Body mass index (BMI) is a measurement that can be used to identify possible weight problems. It estimates body fat based on height and weight. Your health care provider can help determine your BMI and help you achieve or maintain a healthy weight.  Get regular exercise  Get regular exercise. This is one of the most important things you can do for your health. Most adults should:  Exercise for at least 150 minutes each week. The exercise should increase your heart rate and make you sweat (moderate-intensity exercise).  Do strengthening exercises at least twice a week. This is in addition to the moderate-intensity exercise.  Spend less time sitting. Even light physical activity can be beneficial.  Watch cholesterol and blood lipids  Have your blood tested for lipids and cholesterol at 66 years of age, then have this test every 5 years.  You may need to have your cholesterol levels checked more often if:  Your lipid or cholesterol levels are high.  You are older than 66 years of age.  You are at high risk for heart disease.  What should I know about cancer screening?  Many types of cancers can be detected early and may often be prevented. Depending on your health history and family history, you may need to have cancer screening at various ages. This may include screening for:  Colorectal cancer.  Prostate cancer.  Skin cancer.  Lung  cancer.  What should I know about heart disease, diabetes, and high blood pressure?  Blood pressure and heart disease  High blood pressure causes heart disease and increases the risk of stroke. This is more likely to develop in people who have high blood pressure readings or are overweight.  Talk with your health care provider about your target blood pressure readings.  Have your blood pressure checked:  Every 3-5 years if you are 9-95 years of age.  Every year if you are 85 years old or older.  If you are between the ages of 29 and 29 and are a current or former smoker, ask your health care provider if you should have a one-time screening for abdominal aortic aneurysm (AAA).  Diabetes  Have regular diabetes screenings. This checks your fasting blood sugar level. Have the screening done:  Once every three years after age 23 if you are at a normal weight and have a low risk for diabetes.  More often and at a younger age if you are overweight or have a high risk for diabetes.  What should I know about preventing infection?  Hepatitis B  If you have a higher risk for hepatitis B, you should be screened for this virus. Talk with your health care provider to find out if you are at risk for hepatitis B infection.  Hepatitis C  Blood testing is recommended for:  Everyone born from [birth year] through 1963.  Anyone  with known risk factors for hepatitis C.  Sexually transmitted infections (STIs)  You should be screened each year for STIs, including gonorrhea and chlamydia, if:  You are sexually active and are younger than 66 years of age.  You are older than 66 years of age and your health care provider tells you that you are at risk for this type of infection.  Your sexual activity has changed since you were last screened, and you are at increased risk for chlamydia or gonorrhea. Ask your health care provider if you are at risk.  Ask your health care provider about whether you are at high risk for HIV. Your health care provider  may recommend a prescription medicine to help prevent HIV infection. If you choose to take medicine to prevent HIV, you should first get tested for HIV. You should then be tested every 3 months for as long as you are taking the medicine.  Follow these instructions at home:  Alcohol use  Do not drink alcohol if your health care provider tells you not to drink.  If you drink alcohol:  Limit how much you have to 0-2 drinks a day.  Know how much alcohol is in your drink. In the [place] one drink equals one 12 oz bottle of beer (355 mL), one 5 oz glass of wine (148 mL), or one 1 oz glass of hard liquor (44 mL).  Lifestyle  Do not use any products that contain nicotine or tobacco. These products include cigarettes, chewing tobacco, and vaping devices, such as e-cigarettes. If you need help quitting, ask your health care provider.  Do not use street drugs.  Do not share needles.  Ask your health care provider for help if you need support or information about quitting drugs.  General instructions  Schedule regular health, dental, and eye exams.  Stay current with your vaccines.  Tell your health care provider if:  You often feel depressed.  You have ever been abused or do not feel safe at home.  Summary  Adopting a healthy lifestyle and getting preventive care are important in promoting health and wellness.  Follow your health care provider's instructions about healthy diet, exercising, and getting tested or screened for diseases.  Follow your health care provider's instructions on monitoring your cholesterol and blood pressure.  This information is not intended to replace advice given to you by your health care provider. Make sure you discuss any questions you have with your health care provider.  Document Revised: 03/28/2021 Document Reviewed: 03/28/2021  Elsevier Patient Education  2024 [employer].

## 2024-01-29 NOTE — Progress Notes (Addendum)
 Subjective:    Aaron Clarke is a 68 y.o. male who presents for a Welcome to Medicare exam.   Cardiac Risk Factors include: none     Objective:    Today's Vitals   01/29/24 0807  BP: 126/78  Pulse: 70  Resp: 12  Temp: 97.7 F (36.5 C)  TempSrc: Oral  SpO2: 94%  Weight: 292 lb 3.2 oz (132.5 kg)  Height: 6' (1.829 m)   Body mass index is 39.63 kg/m.  Medications Outpatient Encounter Medications as of 01/29/2024  Medication Sig   albuterol (PROVENTIL) (2.5 MG/3ML) 0.083% nebulizer solution Take 3 mLs (2.5 mg total) by nebulization every 6 (six) hours as needed for wheezing or shortness of breath. (Patient not taking: Reported on 01/16/2024)   albuterol (VENTOLIN HFA) 108 (90 Base) MCG/ACT inhaler Inhale 1-2 puffs into the lungs every 6 (six) hours as needed for wheezing or shortness of breath. (Patient not taking: Reported on 01/16/2024)   bisoprolol (ZEBETA) 5 MG tablet Take 1 tablet (5 mg total) by mouth daily.   budesonide-formoterol (SYMBICORT) 160-4.5 MCG/ACT inhaler Take 2 puffs first thing in am and then another 2 puffs about 12 hours later.   celecoxib (CELEBREX) 200 MG capsule TAKE 1 CAPSULE (200 MG TOTAL) BY MOUTH 2 (TWO) TIMES DAILY BETWEEN MEALS AS NEEDED.   methocarbamol (ROBAXIN) 500 MG tablet Take 1 tablet (500 mg total) by mouth every 6 (six) hours as needed.   No facility-administered encounter medications on file as of 01/29/2024.     History: Past Medical History:  Diagnosis Date   Asthma    Cholelithiases    Chronic kidney disease    Kidney stone 04/2011   Family history of breast cancer    mother   Past Surgical History:  Procedure Laterality Date   CHOLECYSTECTOMY  12/19/2011   Procedure: LAPAROSCOPIC CHOLECYSTECTOMY WITH INTRAOPERATIVE CHOLANGIOGRAM;  Surgeon: Ernestene Mention, MD;  Location: [facility] OR;  Service: General;  Laterality: N/A;   COLONOSCOPY WITH PROPOFOL N/A 09/13/2022   Procedure: COLONOSCOPY WITH PROPOFOL;  Surgeon: Toney Reil, MD;  Location: [facility] ENDOSCOPY;  Service: Gastroenterology;  Laterality: N/A;   Colonscopy     Fatty tumor  2003   L chest    Family History  Problem Relation Age of Onset   Cancer Mother        breast   Cancer Father        colon   Heart disease Father    Asthma Sister    Anesthesia problems Neg Hx    Social History   Occupational History   Not on file  Tobacco Use   Smoking status: Former    Current packs/day: 0.00    Types: Cigarettes    Start date: 06/19/1974    Quit date: 06/19/1988    Years since quitting: 35.6   Smokeless tobacco: Never  Vaping Use   Vaping status: Never Used  Substance and Sexual Activity   Alcohol use: Not Currently    Comment: Quit drinking in 2023   Drug use: No   Sexual activity: Not Currently    Tobacco Counseling Counseling given: Not Answered   Immunizations and Health Maintenance Immunization History  Administered Date(s) Administered   PNEUMOCOCCAL CONJUGATE-20 12/17/2023   Tdap 12/17/2023   There are no preventive care reminders to display for this patient.   Activities of Daily Living    01/29/2024    3:59 PM  In your present state of health, do you have any difficulty performing  the following activities:  Hearing? 0  Vision? 0  Difficulty concentrating or making decisions? 0  Walking or climbing stairs? 0  Dressing or bathing? 0  Doing errands, shopping? 0  Preparing Food and eating ? N  Using the Toilet? N  In the past six months, have you accidently leaked urine? N  Do you have problems with loss of bowel control? N  Managing your Medications? N  Managing your Finances? N  Housekeeping or managing your Housekeeping? N    Physical Exam   Physical Exam Vitals and nursing note reviewed.  Constitutional:      Appearance: Normal appearance. He is obese.  HENT:     Head: Normocephalic and atraumatic.  Cardiovascular:     Rate and Rhythm: Normal rate and regular rhythm.     Pulses: Normal pulses.      Heart sounds: Normal heart sounds.  Pulmonary:     Effort: Pulmonary effort is normal.     Breath sounds: Normal breath sounds.  Skin:    General: Skin is warm and dry.     Capillary Refill: Capillary refill takes less than 2 seconds.  Neurological:     General: No focal deficit present.     Mental Status: He is alert and oriented to person, place, and time. Mental status is at baseline.  Psychiatric:        Mood and Affect: Mood normal.        Behavior: Behavior normal.        Thought Content: Thought content normal.        Judgment: Judgment normal.    (optional), or other factors deemed appropriate based on the beneficiary's medical and social history and current clinical standards.   Advanced Directives: Does Patient Have a Medical Advance Directive?: No Would patient like information on creating a medical advance directive?: Yes (Inpatient - patient defers creating a medical advance directive at this time - Information given)   EKG:  normal EKG, normal sinus rhythm performed 12/28/2023     Assessment:    This is a routine wellness  examination for this patient . No concerns. HM items UTD. Recent CPE without concerns. Planned hip replacement April 1.   Vision/Hearing screen No results found.   Goals      Increase physical activity     After hip replacement, upcoming next month         Depression Screen    01/29/2024    8:17 AM 12/17/2023   11:41 AM 08/07/2022    3:17 PM  PHQ 2/9 Scores  PHQ - 2 Score 0 0 0  PHQ- 9 Score 0 1      Fall Risk    01/29/2024    8:21 AM  Fall Risk   Falls in the past year? 0  Number falls in past yr: 0  Injury with Fall? 0  Risk for fall due to : No Fall Risks    Cognitive Function        01/29/2024    8:22 AM  6CIT Screen  What Year? 0 points  What month? 0 points  What time? 0 points  Count back from 20 0 points  Months in reverse 2 points  Repeat phrase 0 points  Total Score 2 points    Patient Care  Team: Park Meo, FNP as PCP - General (Family Medicine) Nyoka Cowden, MD as Consulting Physician (Pulmonary Disease)     Plan:   Increase physical activity as tolerated after  hip replacement with goal 30 minutes daily 5 days per week moderate intensity exercise.   I have personally reviewed and noted the following in the patient's chart:   Medical and social history Use of alcohol, tobacco or illicit drugs  Current medications and supplements Functional ability and status Nutritional status Physical activity Advanced directives List of other physicians Hospitalizations, surgeries, and ER visits in previous 12 months Vitals Screenings to include cognitive, depression, and falls Referrals and appointments  In addition, I have reviewed and discussed with patient certain preventive protocols, quality metrics, and best practice recommendations. A written personalized care plan for preventive services as well as general preventive health recommendations were provided to patient.     Park Meo, [place] 01/29/2024

## 2024-02-07 NOTE — Progress Notes (Signed)
 Surgical Instructions   Your procedure is scheduled on Tuesday, April 1, 25. Report to [facility] Main Entrance "A" at 8:00 A.M., then check in with the Admitting office. Any questions or running late day of surgery: call [phone]  Questions prior to your surgery date: call [phone], Monday-Friday, 8am-4pm. If you experience any cold or flu symptoms such as cough, fever, chills, shortness of breath, etc. between now and your scheduled surgery, please notify us at the above number.     Remember:  Do not eat after midnight the night before your surgery  You may drink clear liquids until 7:00 the morning of your surgery.   Clear liquids allowed are: Water, Non-Citrus Juices (without pulp), Carbonated Beverages, Clear Tea (no milk, honey, etc.), Black Coffee Only (NO MILK, CREAM OR POWDERED CREAMER of any kind), and Gatorade.    Take these medicines the morning of surgery with A SIP OF WATER  budesonide-formoterol (SYMBICORT) inhaler  celecoxib (CELEBREX)   May take these medicines IF NEEDED: albuterol (VENTOLIN HFA) inhaler - bring with you to the hospital on day of surgery  methocarbamol (ROBAXIN)   Follow your surgeon's instructions on when to stop Asprin.  If no instructions were given by your surgeon then you will need to call the office to get those instructions.     One week prior to surgery, STOP taking any Aleve, Naproxen, Ibuprofen, Motrin, Advil, Goody's, BC's, all herbal medications, fish oil, and non-prescription vitamins.                     Do NOT Smoke (Tobacco/Vaping) for 24 hours prior to your procedure.  If you use a CPAP at night, you may bring your mask/headgear for your overnight stay.   You will be asked to remove any contacts, glasses, piercing's, hearing aid's, dentures/partials prior to surgery. Please bring cases for these items if needed.    Patients discharged the day of surgery will not be allowed to drive home, and someone needs to stay with  them for 24 hours.  SURGICAL WAITING ROOM VISITATION Patients may have no more than 2 support people in the waiting area - these visitors may rotate.   Pre-op nurse will coordinate an appropriate time for 1 ADULT support person, who may not rotate, to accompany patient in pre-op.  Children under the age of 67 must have an adult with them who is not the patient and must remain in the main waiting area with an adult.  If the patient needs to stay at the hospital during part of their recovery, the visitor guidelines for inpatient rooms apply.  Please refer to the [facility] website for the visitor guidelines for any additional information.   If you received a COVID test during your pre-op visit  it is requested that you wear a mask when out in public, stay away from anyone that may not be feeling well and notify your surgeon if you develop symptoms. If you have been in contact with anyone that has tested positive in the last 10 days please notify you surgeon.      Pre-operative 5 CHG Bathing Instructions   You can play a key role in reducing the risk of infection after surgery. Your skin needs to be as free of germs as possible. You can reduce the number of germs on your skin by washing with CHG (chlorhexidine gluconate) soap before surgery. CHG is an antiseptic soap that kills germs and continues to kill germs even after washing.  DO NOT use if you have an allergy to chlorhexidine/CHG or antibacterial soaps. If your skin becomes reddened or irritated, stop using the CHG and notify one of our RNs at [phone].   Please shower with the CHG soap starting 4 days before surgery using the following schedule:     Please keep in mind the following:  DO NOT shave, including legs and underarms, starting the day of your first shower.   You may shave your face at any point before/day of surgery.  Place clean sheets on your bed the day you start using CHG soap. Use a clean washcloth (not used  since being washed) for each shower. DO NOT sleep with pets once you start using the CHG.   CHG Shower Instructions:  Wash your face and private area with normal soap. If you choose to wash your hair, wash first with your normal shampoo.  After you use shampoo/soap, rinse your hair and body thoroughly to remove shampoo/soap residue.  Turn the water OFF and apply about 3 tablespoons (45 ml) of CHG soap to a CLEAN washcloth.  Apply CHG soap ONLY FROM YOUR NECK DOWN TO YOUR TOES (washing for 3-5 minutes)  DO NOT use CHG soap on face, private areas, open wounds, or sores.  Pay special attention to the area where your surgery is being performed.  If you are having back surgery, having someone wash your back for you may be helpful. Wait 2 minutes after CHG soap is applied, then you may rinse off the CHG soap.  Pat dry with a clean towel  Put on clean clothes/pajamas   If you choose to wear lotion, please use ONLY the CHG-compatible lotions that are listed below.  Additional instructions for the day of surgery: DO NOT APPLY any lotions, deodorants, cologne, or perfumes.   Do not bring valuables to the hospital. [facility] is not responsible for any belongings/valuables. Do not wear nail polish, gel polish, artificial nails, or any other type of covering on natural nails (fingers and toes) Do not wear jewelry or makeup Put on clean/comfortable clothes.  Please brush your teeth.  Ask your nurse before applying any prescription medications to the skin.     CHG Compatible Lotions   Aveeno Moisturizing lotion  Cetaphil Moisturizing Cream  Cetaphil Moisturizing Lotion  Clairol Herbal Essence Moisturizing Lotion, Dry Skin  Clairol Herbal Essence Moisturizing Lotion, Extra Dry Skin  Clairol Herbal Essence Moisturizing Lotion, Normal Skin  Curel Age Defying Therapeutic Moisturizing Lotion with Alpha Hydroxy  Curel Extreme Care Body Lotion  Curel Soothing Hands Moisturizing Hand Lotion  Curel  Therapeutic Moisturizing Cream, Fragrance-Free  Curel Therapeutic Moisturizing Lotion, Fragrance-Free  Curel Therapeutic Moisturizing Lotion, Original Formula  Eucerin Daily Replenishing Lotion  Eucerin Dry Skin Therapy Plus Alpha Hydroxy Crme  Eucerin Dry Skin Therapy Plus Alpha Hydroxy Lotion  Eucerin Original Crme  Eucerin Original Lotion  Eucerin Plus Crme Eucerin Plus Lotion  Eucerin TriLipid Replenishing Lotion  Keri Anti-Bacterial Hand Lotion  Keri Deep Conditioning Original Lotion Dry Skin Formula Softly Scented  Keri Deep Conditioning Original Lotion, Fragrance Free Sensitive Skin Formula  Keri Lotion Fast Absorbing Fragrance Free Sensitive Skin Formula  Keri Lotion Fast Absorbing Softly Scented Dry Skin Formula  Keri Original Lotion  Keri Skin Renewal Lotion Keri Silky Smooth Lotion  Keri Silky Smooth Sensitive Skin Lotion  Nivea Body Creamy Conditioning Oil  Nivea Body Extra Enriched Teacher, adult education Moisturizing Lotion Nivea Crme  Nivea  Skin Firming Lotion  NutraDerm 30 Skin Lotion  NutraDerm Skin Lotion  NutraDerm Therapeutic Skin Cream  NutraDerm Therapeutic Skin Lotion  ProShield Protective Hand Cream  Provon moisturizing lotion  Please read over the following fact sheets that you were given.

## 2024-02-08 ENCOUNTER — Emergency Department (HOSPITAL_COMMUNITY)
Admission: EM | Admit: 2024-02-08 | Discharge: 2024-02-08 | Disposition: A | Discharge status: Home / Self Care | Attending: Emergency Medicine | Admitting: Emergency Medicine

## 2024-02-08 ENCOUNTER — Encounter (HOSPITAL_COMMUNITY)
Admission: RE | Admit: 2024-02-08 | Discharge: 2024-02-08 | Disposition: A | Discharge status: Home / Self Care | Source: Ambulatory Visit | Attending: Orthopaedic Surgery | Admitting: Orthopaedic Surgery

## 2024-02-08 ENCOUNTER — Other Ambulatory Visit: Payer: Self-pay

## 2024-02-08 ENCOUNTER — Telehealth: Payer: Self-pay | Admitting: Radiology

## 2024-02-08 ENCOUNTER — Encounter (HOSPITAL_COMMUNITY): Payer: Self-pay

## 2024-02-08 VITALS — BP 133/74 | HR 64 | Temp 97.8°F | Resp 18 | Ht 72.0 in | Wt 304.5 lb

## 2024-02-08 DIAGNOSIS — S0081XA Abrasion of other part of head, initial encounter: Secondary | ICD-10-CM | POA: Insufficient documentation

## 2024-02-08 DIAGNOSIS — Z7982 Long term (current) use of aspirin: Secondary | ICD-10-CM | POA: Insufficient documentation

## 2024-02-08 DIAGNOSIS — I129 Hypertensive chronic kidney disease with stage 1 through stage 4 chronic kidney disease, or unspecified chronic kidney disease: Secondary | ICD-10-CM | POA: Insufficient documentation

## 2024-02-08 DIAGNOSIS — J45909 Unspecified asthma, uncomplicated: Secondary | ICD-10-CM | POA: Insufficient documentation

## 2024-02-08 DIAGNOSIS — I10 Essential (primary) hypertension: Secondary | ICD-10-CM | POA: Diagnosis not present

## 2024-02-08 DIAGNOSIS — Z87891 Personal history of nicotine dependence: Secondary | ICD-10-CM | POA: Insufficient documentation

## 2024-02-08 DIAGNOSIS — S0993XA Unspecified injury of face, initial encounter: Secondary | ICD-10-CM | POA: Diagnosis present

## 2024-02-08 DIAGNOSIS — M1611 Unilateral primary osteoarthritis, right hip: Secondary | ICD-10-CM | POA: Insufficient documentation

## 2024-02-08 DIAGNOSIS — Z01818 Encounter for other preprocedural examination: Secondary | ICD-10-CM

## 2024-02-08 DIAGNOSIS — Z9049 Acquired absence of other specified parts of digestive tract: Secondary | ICD-10-CM | POA: Insufficient documentation

## 2024-02-08 DIAGNOSIS — Z86711 Personal history of pulmonary embolism: Secondary | ICD-10-CM | POA: Insufficient documentation

## 2024-02-08 DIAGNOSIS — Z01812 Encounter for preprocedural laboratory examination: Secondary | ICD-10-CM | POA: Insufficient documentation

## 2024-02-08 DIAGNOSIS — S40812A Abrasion of left upper arm, initial encounter: Secondary | ICD-10-CM | POA: Diagnosis not present

## 2024-02-08 DIAGNOSIS — Z86718 Personal history of other venous thrombosis and embolism: Secondary | ICD-10-CM | POA: Insufficient documentation

## 2024-02-08 DIAGNOSIS — N189 Chronic kidney disease, unspecified: Secondary | ICD-10-CM | POA: Insufficient documentation

## 2024-02-08 HISTORY — DX: Essential (primary) hypertension: I10

## 2024-02-08 HISTORY — DX: Unilateral primary osteoarthritis, right hip: M16.11

## 2024-02-08 LAB — CBC
HCT: 45.6 % (ref 39.0–52.0)
Hemoglobin: 15.3 g/dL (ref 13.0–17.0)
MCH: 30.4 pg (ref 26.0–34.0)
MCHC: 33.6 g/dL (ref 30.0–36.0)
MCV: 90.7 fL (ref 80.0–100.0)
Platelets: 170 10*3/uL (ref 150–400)
RBC: 5.03 MIL/uL (ref 4.22–5.81)
RDW: 13.5 % (ref 11.5–15.5)
WBC: 7.3 10*3/uL (ref 4.0–10.5)
nRBC: 0 % (ref 0.0–0.2)

## 2024-02-08 LAB — BASIC METABOLIC PANEL
Anion gap: 8 (ref 5–15)
BUN: 24 mg/dL — ABNORMAL HIGH (ref 8–23)
CO2: 22 mmol/L (ref 22–32)
Calcium: 9.4 mg/dL (ref 8.9–10.3)
Chloride: 110 mmol/L (ref 98–111)
Creatinine, Ser: 1.43 mg/dL — ABNORMAL HIGH (ref 0.61–1.24)
GFR, Estimated: 54 mL/min — ABNORMAL LOW (ref >60)
Glucose, Bld: 106 mg/dL — ABNORMAL HIGH (ref 70–99)
Potassium: 3.9 mmol/L (ref 3.5–5.1)
Sodium: 140 mmol/L (ref 135–145)

## 2024-02-08 LAB — SURGICAL PCR SCREEN
MRSA, PCR: NEGATIVE
Staphylococcus aureus: NEGATIVE

## 2024-02-08 LAB — TYPE AND SCREEN
ABO/RH(D): A POS
Antibody Screen: NEGATIVE

## 2024-02-08 MED ORDER — BACITRACIN ZINC 500 UNIT/GM EX OINT
TOPICAL_OINTMENT | CUTANEOUS | Status: AC
  Filled 2024-02-08: qty 0.9

## 2024-02-08 MED ORDER — BACITRACIN ZINC 500 UNIT/GM EX OINT: TOPICAL_OINTMENT | Freq: Once | CUTANEOUS | Status: AC

## 2024-02-08 NOTE — Progress Notes (Signed)
   02/08/24 1119  OBSTRUCTIVE SLEEP APNEA  Have you ever been diagnosed with sleep apnea through a sleep study? No  Do you snore loudly (loud enough to be heard through closed doors)?  1  Do you often feel tired, fatigued, or sleepy during the daytime (such as falling asleep during driving or talking to someone)? 0  Has anyone observed you stop breathing during your sleep? 0  Do you have, or are you being treated for high blood pressure? 1  BMI more than 35 kg/m2? 1  Age > 50 (1-yes) 1  Neck circumference greater than:Male 16 inches or larger, Male 17inches or larger? 1  Male Gender (Yes=1) 1  Obstructive Sleep Apnea Score 6  Score 5 or greater  Results sent to PCP

## 2024-02-08 NOTE — Telephone Encounter (Signed)
 Called patient

## 2024-02-08 NOTE — Progress Notes (Signed)
 PCP - Kurtis Bushman, FNP Cardiologist - denies Pulmonologist- Dr. Sandrea Hughs  PPM/ICD - denies   Chest x-ray - 12/27/23 EKG - 12/27/23 Stress Test - denies ECHO - 10/24/22 Cardiac Cath - denies  Sleep Study - denies   DM- denies  Last dose of GLP1 agonist-  n/a   Blood Thinner Instructions: n/a Aspirin Instructions: f/u with surgeon  ERAS Protcol - yes PRE-SURGERY Ensure given at PAT  COVID TEST- n/a   Anesthesia review: yes, pulmonary clearance  Patient denies shortness of breath, fever, cough and chest pain at PAT appointment   All instructions explained to the patient, with a verbal understanding of the material. Patient agrees to go over the instructions while at home for a better understanding. The opportunity to ask questions was provided.
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
## 2024-02-08 NOTE — Telephone Encounter (Signed)
 Patient is scheduled to THA on 4/1. States he has stopped eliquis, patient is checking if he needs to stop asprin as well before surgery.

## 2024-02-08 NOTE — Progress Notes (Signed)
 Surgical Instructions   Your procedure is scheduled on Tuesday, April 1st. Report to [facility] Main Entrance "A" at 8:00 A.M., then check in with the Admitting office. Any questions or running late day of surgery: call [phone]  Questions prior to your surgery date: call [phone], Monday-Friday, 8am-4pm. If you experience any cold or flu symptoms such as cough, fever, chills, shortness of breath, etc. between now and your scheduled surgery, please notify us at the above number.     Remember:  Do not eat after midnight the night before your surgery  You may drink clear liquids until 7:00 AM the morning of your surgery.   Clear liquids allowed are: Water, Non-Citrus Juices (without pulp), Carbonated Beverages, Clear Tea (no milk, honey, etc.), Black Coffee Only (NO MILK, CREAM OR POWDERED CREAMER of any kind), and Gatorade.  Patient Instructions  The night before surgery:  No food after midnight. ONLY clear liquids after midnight  The day of surgery (if you do NOT have diabetes):  Drink ONE (1) Pre-Surgery Clear Ensure by 07:00 AM the morning of surgery. Drink in one sitting. Do not sip.  This drink was given to you during your hospital  pre-op appointment visit.  Nothing else to drink after completing the  Pre-Surgery Clear Ensure.         If you have questions, please contact your surgeon's office.    Take these medicines the morning of surgery with A SIP OF WATER  budesonide-formoterol (SYMBICORT) inhaler  bisoprolol (ZEBETA)    May take these medicines IF NEEDED: albuterol (VENTOLIN HFA) inhaler - bring with you to the hospital on day of surgery  methocarbamol (ROBAXIN)    Follow your surgeon's instructions on when to stop Asprin.  If no instructions were given by your surgeon then you will need to call the office to get those instructions.     One week prior to surgery, STOP taking any Aleve, Naproxen, Ibuprofen, Motrin, Advil, Goody's, BC's, all herbal  medications, fish oil, and non-prescription vitamins. This includes celecoxib (CELEBREX).                     Do NOT Smoke (Tobacco/Vaping) for 24 hours prior to your procedure.  If you use a CPAP at night, you may bring your mask/headgear for your overnight stay.   You will be asked to remove any contacts, glasses, piercing's, hearing aid's, dentures/partials prior to surgery. Please bring cases for these items if needed.    Patients discharged the day of surgery will not be allowed to drive home, and someone needs to stay with them for 24 hours.  SURGICAL WAITING ROOM VISITATION Patients may have no more than 2 support people in the waiting area - these visitors may rotate.   Pre-op nurse will coordinate an appropriate time for 1 ADULT support person, who may not rotate, to accompany patient in pre-op.  Children under the age of 36 must have an adult with them who is not the patient and must remain in the main waiting area with an adult.  If the patient needs to stay at the hospital during part of their recovery, the visitor guidelines for inpatient rooms apply.  Please refer to the [facility] website for the visitor guidelines for any additional information.   If you received a COVID test during your pre-op visit  it is requested that you wear a mask when out in public, stay away from anyone that may not be feeling well and notify  your surgeon if you develop symptoms. If you have been in contact with anyone that has tested positive in the last 10 days please notify you surgeon.      Pre-operative 5 CHG Bathing Instructions   You can play a key role in reducing the risk of infection after surgery. Your skin needs to be as free of germs as possible. You can reduce the number of germs on your skin by washing with CHG (chlorhexidine gluconate) soap before surgery. CHG is an antiseptic soap that kills germs and continues to kill germs even after washing.   DO NOT use if you have an  allergy to chlorhexidine/CHG or antibacterial soaps. If your skin becomes reddened or irritated, stop using the CHG and notify one of our RNs at [phone].   Please shower with the CHG soap starting 4 days before surgery using the following schedule:     Please keep in mind the following:  DO NOT shave, including legs and underarms, starting the day of your first shower.   You may shave your face at any point before/day of surgery.  Place clean sheets on your bed the day you start using CHG soap. Use a clean washcloth (not used since being washed) for each shower. DO NOT sleep with pets once you start using the CHG.   CHG Shower Instructions:  Wash your face and private area with normal soap. If you choose to wash your hair, wash first with your normal shampoo.  After you use shampoo/soap, rinse your hair and body thoroughly to remove shampoo/soap residue.  Turn the water OFF and apply about 3 tablespoons (45 ml) of CHG soap to a CLEAN washcloth.  Apply CHG soap ONLY FROM YOUR NECK DOWN TO YOUR TOES (washing for 3-5 minutes)  DO NOT use CHG soap on face, private areas, open wounds, or sores.  Pay special attention to the area where your surgery is being performed.  If you are having back surgery, having someone wash your back for you may be helpful. Wait 2 minutes after CHG soap is applied, then you may rinse off the CHG soap.  Pat dry with a clean towel  Put on clean clothes/pajamas   If you choose to wear lotion, please use ONLY the CHG-compatible lotions that are listed below.  Additional instructions for the day of surgery: DO NOT APPLY any lotions, deodorants, cologne, or perfumes.   Do not bring valuables to the hospital. [facility] is not responsible for any belongings/valuables. Do not wear nail polish, gel polish, artificial nails, or any other type of covering on natural nails (fingers and toes) Do not wear jewelry or makeup Put on clean/comfortable clothes.  Please  brush your teeth.  Ask your nurse before applying any prescription medications to the skin.     CHG Compatible Lotions   Aveeno Moisturizing lotion  Cetaphil Moisturizing Cream  Cetaphil Moisturizing Lotion  Clairol Herbal Essence Moisturizing Lotion, Dry Skin  Clairol Herbal Essence Moisturizing Lotion, Extra Dry Skin  Clairol Herbal Essence Moisturizing Lotion, Normal Skin  Curel Age Defying Therapeutic Moisturizing Lotion with Alpha Hydroxy  Curel Extreme Care Body Lotion  Curel Soothing Hands Moisturizing Hand Lotion  Curel Therapeutic Moisturizing Cream, Fragrance-Free  Curel Therapeutic Moisturizing Lotion, Fragrance-Free  Curel Therapeutic Moisturizing Lotion, Original Formula  Eucerin Daily Replenishing Lotion  Eucerin Dry Skin Therapy Plus Alpha Hydroxy Crme  Eucerin Dry Skin Therapy Plus Alpha Hydroxy Lotion  Eucerin Original Crme  Eucerin Original Lotion  Eucerin Plus Crme Eucerin  Plus Lotion  Eucerin TriLipid Replenishing Lotion  Keri Anti-Bacterial Hand Lotion  Keri Deep Conditioning Original Lotion Dry Skin Formula Softly Scented  Keri Deep Conditioning Original Lotion, Fragrance Free Sensitive Skin Formula  Keri Lotion Fast Absorbing Fragrance Free Sensitive Skin Formula  Keri Lotion Fast Absorbing Softly Scented Dry Skin Formula  Keri Original Lotion  Keri Skin Renewal Lotion Keri Silky Smooth Lotion  Keri Silky Smooth Sensitive Skin Lotion  Nivea Body Creamy Conditioning Oil  Nivea Body Extra Enriched Lotion  Nivea Body Original Lotion  Nivea Body Sheer Moisturizing Lotion Nivea Crme  Nivea Skin Firming Lotion  NutraDerm 30 Skin Lotion  NutraDerm Skin Lotion  NutraDerm Therapeutic Skin Cream  NutraDerm Therapeutic Skin Lotion  ProShield Protective Hand Cream  Provon moisturizing lotion  Please read over the following fact sheets that you were given.

## 2024-02-08 NOTE — Discharge Instructions (Signed)
 Local wound care with bacitracin and dressing changes twice daily.  Return to the emergency department if you develop severe headache, difficulty with balance, vomiting, or for any other new and concerning symptom.
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
## 2024-02-12 ENCOUNTER — Encounter (HOSPITAL_COMMUNITY): Payer: Self-pay
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
## 2024-02-18 NOTE — H&P (Signed)
 TOTAL HIP ADMISSION H&P  Patient is admitted for right total hip arthroplasty.  Subjective:  Chief Complaint: right hip pain  HPI: Aaron Clarke, 68 y.o. male, has a history of pain and functional disability in the right hip(s) due to arthritis and patient has failed non-surgical conservative treatments for greater than 12 weeks to include NSAID's and/or analgesics, corticosteriod injections, use of assistive devices, weight reduction as appropriate, and activity modification.  Onset of symptoms was gradual starting 6 years ago with gradually worsening course since that time.The patient noted no past surgery on the right hip(s).  Patient currently rates pain in the right hip at 10 out of 10 with activity. Patient has night pain, worsening of pain with activity and weight bearing, trendelenberg gait, pain that interfers with activities of daily living, and pain with passive range of motion. Patient has evidence of subchondral cysts, subchondral sclerosis, periarticular osteophytes, and joint space narrowing by imaging studies. This condition presents safety issues increasing the risk of falls.  There is no current active infection.  Patient Active Problem List   Diagnosis Date Noted   Asthmatic bronchitis , chronic (HCC) 01/02/2024   Physical exam, annual 12/17/2023   Morbid obesity (HCC) 12/17/2023   BMI 38.0-38.9,adult 12/17/2023   Pain in joint of right hip 11/07/2022   Unilateral primary osteoarthritis, right hip 11/07/2022   Strain of muscle of right hip 11/07/2022   Trochanteric bursitis of right hip 11/07/2022   Dyspnea on exertion 10/24/2022   Elevated troponin 10/24/2022   Pulmonary embolus (HCC) 10/24/2022   Acute hypoxic respiratory failure (HCC) 10/23/2022   Primary hypertension 10/18/2022   History of colonic polyps    Family history of colon cancer in father    Polyp of ascending colon    Cecal polyp    Chronic diffuse otitis externa of both ears 09/04/2022   Knee  arthropathy 09/04/2022   Screening for colon cancer 08/07/2022   Lumbago of lumbar region with sciatica 08/07/2022   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   Rosacea 05/20/2021   Cholecystitis with cholelithiasis 12/19/2011   Class 3 severe obesity due to excess calories with serious comorbidity and body mass index (BMI) of 40.0 to 44.9 in adult [facility]) 12/14/2011   Asthma 12/14/2011   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   Family history of breast cancer    mother   Hypertension    Osteoarthritis of right hip    PE (pulmonary thromboembolism) (HCC) 10/23/2022    Past Surgical History:  Procedure Laterality Date   CHOLECYSTECTOMY  12/19/2011   Procedure: LAPAROSCOPIC CHOLECYSTECTOMY WITH INTRAOPERATIVE CHOLANGIOGRAM;  Surgeon: Ernestene Mention, MD;  Location: [facility] OR;  Service: General;  Laterality: N/A;   COLONOSCOPY WITH PROPOFOL N/A 09/13/2022   Procedure: COLONOSCOPY WITH PROPOFOL;  Surgeon: Toney Reil, MD;  Location: [facility] ENDOSCOPY;  Service: Gastroenterology;  Laterality: N/A;   Colonscopy     Fatty tumor  2003   L chest    No current facility-administered medications for this encounter.   Current Outpatient Medications  Medication Sig Dispense Refill Last Dose/Taking   albuterol (VENTOLIN HFA) 108 (90 Base) MCG/ACT inhaler Inhale 1-2 puffs into the lungs every 6 (six) hours as needed for wheezing or shortness of breath. 8 g 0 Taking As Needed   aspirin EC 81  MG tablet Take 81 mg by mouth daily. Swallow whole.   Taking   bisoprolol (ZEBETA) 5 MG tablet Take 1 tablet (5 mg total) by mouth daily. 30 tablet 11 Taking   budesonide-formoterol (SYMBICORT) 160-4.5 MCG/ACT inhaler Take 2 puffs first thing in am and then another 2 puffs about 12 hours later. 1 each 11 Taking   celecoxib (CELEBREX) 200 MG capsule TAKE 1  CAPSULE (200 MG TOTAL) BY MOUTH 2 (TWO) TIMES DAILY BETWEEN MEALS AS NEEDED. 60 capsule 1 Taking As Needed   methocarbamol (ROBAXIN) 500 MG tablet Take 1 tablet (500 mg total) by mouth every 6 (six) hours as needed. 40 tablet 1 Taking As Needed   albuterol (PROVENTIL) (2.5 MG/3ML) 0.083% nebulizer solution Take 3 mLs (2.5 mg total) by nebulization every 6 (six) hours as needed for wheezing or shortness of breath. (Patient not taking: Reported on 01/16/2024) 75 mL 0    No Known Allergies  Social History   Tobacco Use   Smoking status: Former    Current packs/day: 0.00    Types: Cigarettes    Start date: 06/19/1974    Quit date: 06/19/1988    Years since quitting: 35.6   Smokeless tobacco: Never  Substance Use Topics   Alcohol use: Not Currently    Comment: Quit drinking in 2023    Family History  Problem Relation Age of Onset   Cancer Mother        breast   Cancer Father        colon   Heart disease Father    Asthma Sister    Anesthesia problems Neg Hx      Review of Systems  Objective:  Physical Exam Vitals reviewed.  Constitutional:      Appearance: Normal appearance. He is obese.  HENT:     Head: Normocephalic and atraumatic.  Eyes:     Extraocular Movements: Extraocular movements intact.     Pupils: Pupils are equal, round, and reactive to light.  Cardiovascular:     Rate and Rhythm: Normal rate.  Pulmonary:     Effort: Pulmonary effort is normal.  Abdominal:     Palpations: Abdomen is soft.  Musculoskeletal:     Cervical back: Normal range of motion and neck supple.     Right hip: Tenderness and bony tenderness present. Decreased range of motion. Decreased strength.  Neurological:     Mental Status: He is alert and oriented to person, place, and time.  Psychiatric:        Behavior: Behavior normal.     Vital signs in last 24 hours:    Labs:   Estimated body mass index is 41.3 kg/m as calculated from the following:   Height as of 02/08/24: 6' (1.829  m).   Weight as of 02/08/24: 138.1 kg.   Imaging Review Plain radiographs demonstrate severe degenerative joint disease of the right hip(s). The bone quality appears to be good for age and reported activity level.      Assessment/Plan:  End stage arthritis, right hip(s)  The patient history, physical examination, clinical judgement of the provider and imaging studies are consistent with end stage degenerative joint disease of the right hip(s) and total hip arthroplasty is deemed medically necessary. The treatment options including medical management, injection therapy, arthroscopy and arthroplasty were discussed at length. The risks and benefits of total hip arthroplasty were presented and reviewed. The risks due to aseptic loosening, infection, stiffness, dislocation/subluxation,  thromboembolic complications and other imponderables were discussed.  The  patient acknowledged the explanation, agreed to proceed with the plan and consent was signed. Patient is being admitted for inpatient treatment for surgery, pain control, PT, OT, prophylactic antibiotics, VTE prophylaxis, progressive ambulation and ADL's and discharge planning.The patient is planning to be discharged home with home health services

## 2024-02-19 ENCOUNTER — Ambulatory Visit (HOSPITAL_BASED_OUTPATIENT_CLINIC_OR_DEPARTMENT_OTHER): Admitting: Anesthesiology

## 2024-02-19 ENCOUNTER — Encounter (HOSPITAL_COMMUNITY)
Admission: RE | Disposition: A | Discharge status: Home Health | Payer: Self-pay | Source: Home / Self Care | Attending: Orthopaedic Surgery

## 2024-02-19 ENCOUNTER — Observation Stay (HOSPITAL_COMMUNITY)
Admission: RE | Admit: 2024-02-19 | Discharge: 2024-02-21 | Disposition: A | Discharge status: Home Health | Attending: Orthopaedic Surgery | Admitting: Orthopaedic Surgery

## 2024-02-19 ENCOUNTER — Ambulatory Visit (HOSPITAL_COMMUNITY): Payer: Self-pay | Admitting: Vascular Surgery

## 2024-02-19 ENCOUNTER — Encounter (HOSPITAL_COMMUNITY): Payer: Self-pay | Admitting: Orthopaedic Surgery

## 2024-02-19 ENCOUNTER — Other Ambulatory Visit: Payer: Self-pay

## 2024-02-19 ENCOUNTER — Ambulatory Visit (HOSPITAL_COMMUNITY)

## 2024-02-19 ENCOUNTER — Observation Stay (HOSPITAL_COMMUNITY)

## 2024-02-19 DIAGNOSIS — Z86718 Personal history of other venous thrombosis and embolism: Secondary | ICD-10-CM | POA: Insufficient documentation

## 2024-02-19 DIAGNOSIS — Z7982 Long term (current) use of aspirin: Secondary | ICD-10-CM | POA: Insufficient documentation

## 2024-02-19 DIAGNOSIS — Z7985 Long-term (current) use of injectable non-insulin antidiabetic drugs: Secondary | ICD-10-CM | POA: Insufficient documentation

## 2024-02-19 DIAGNOSIS — J45909 Unspecified asthma, uncomplicated: Secondary | ICD-10-CM | POA: Diagnosis not present

## 2024-02-19 DIAGNOSIS — N189 Chronic kidney disease, unspecified: Secondary | ICD-10-CM | POA: Insufficient documentation

## 2024-02-19 DIAGNOSIS — Z6838 Body mass index (BMI) 38.0-38.9, adult: Secondary | ICD-10-CM

## 2024-02-19 DIAGNOSIS — Z86711 Personal history of pulmonary embolism: Secondary | ICD-10-CM | POA: Insufficient documentation

## 2024-02-19 DIAGNOSIS — I10 Essential (primary) hypertension: Secondary | ICD-10-CM | POA: Diagnosis not present

## 2024-02-19 DIAGNOSIS — I129 Hypertensive chronic kidney disease with stage 1 through stage 4 chronic kidney disease, or unspecified chronic kidney disease: Secondary | ICD-10-CM | POA: Diagnosis not present

## 2024-02-19 DIAGNOSIS — M1611 Unilateral primary osteoarthritis, right hip: Secondary | ICD-10-CM

## 2024-02-19 DIAGNOSIS — Z96641 Presence of right artificial hip joint: Secondary | ICD-10-CM

## 2024-02-19 DIAGNOSIS — Z87891 Personal history of nicotine dependence: Secondary | ICD-10-CM | POA: Diagnosis not present

## 2024-02-19 HISTORY — PX: TOTAL HIP ARTHROPLASTY: SHX124

## 2024-02-19 LAB — ABO/RH: ABO/RH(D): A POS

## 2024-02-19 SURGERY — ARTHROPLASTY, HIP, TOTAL, ANTERIOR APPROACH
Anesthesia: Spinal | Site: Hip | Laterality: Right

## 2024-02-19 MED ORDER — OXYCODONE HCL 5 MG PO TABS
ORAL_TABLET | ORAL | Status: AC
  Filled 2024-02-19: qty 2

## 2024-02-19 MED ORDER — 0.9 % SODIUM CHLORIDE (POUR BTL) OPTIME
TOPICAL | Status: DC | PRN
  Administered 2024-02-19: 1000 mL

## 2024-02-19 MED ORDER — METHOCARBAMOL 1000 MG/10ML IJ SOLN: 500.0000 mg | Freq: Four times a day (QID) | INTRAMUSCULAR | Status: DC | PRN

## 2024-02-19 MED ORDER — METHOCARBAMOL 500 MG PO TABS
ORAL_TABLET | ORAL | Status: AC
  Administered 2024-02-19: 500 mg
  Filled 2024-02-19: qty 1

## 2024-02-19 MED ORDER — ALBUTEROL SULFATE (2.5 MG/3ML) 0.083% IN NEBU: 3.0000 mL | INHALATION_SOLUTION | Freq: Four times a day (QID) | RESPIRATORY_TRACT | Status: DC | PRN

## 2024-02-19 MED ORDER — ACETAMINOPHEN 500 MG PO TABS
1000.0000 mg | ORAL_TABLET | Freq: Once | ORAL | Status: AC
  Administered 2024-02-19: 1000 mg via ORAL
  Filled 2024-02-19: qty 2

## 2024-02-19 MED ORDER — PROPOFOL 500 MG/50ML IV EMUL
INTRAVENOUS | Status: DC | PRN
  Administered 2024-02-19: 75 ug/kg/min via INTRAVENOUS

## 2024-02-19 MED ORDER — CEFAZOLIN SODIUM-DEXTROSE 3-4 GM/150ML-% IV SOLN
3.0000 g | INTRAVENOUS | Status: AC
  Administered 2024-02-19: 3 g via INTRAVENOUS
  Filled 2024-02-19: qty 150

## 2024-02-19 MED ORDER — METHOCARBAMOL 500 MG PO TABS
500.0000 mg | ORAL_TABLET | Freq: Four times a day (QID) | ORAL | Status: DC | PRN
  Administered 2024-02-19 – 2024-02-21 (×4): 500 mg via ORAL
  Filled 2024-02-19 (×5): qty 1

## 2024-02-19 MED ORDER — PHENOL 1.4 % MT LIQD: 1.0000 | OROMUCOSAL | Status: DC | PRN

## 2024-02-19 MED ORDER — FENTANYL CITRATE (PF) 250 MCG/5ML IJ SOLN
INTRAMUSCULAR | Status: AC
  Filled 2024-02-19: qty 5

## 2024-02-19 MED ORDER — METOCLOPRAMIDE HCL 5 MG/ML IJ SOLN: 5.0000 mg | Freq: Three times a day (TID) | INTRAMUSCULAR | Status: DC | PRN

## 2024-02-19 MED ORDER — POVIDONE-IODINE 10 % EX SWAB
2.0000 | Freq: Once | CUTANEOUS | Status: AC
  Administered 2024-02-19: 2 via TOPICAL

## 2024-02-19 MED ORDER — CEFAZOLIN SODIUM-DEXTROSE 2-4 GM/100ML-% IV SOLN
2.0000 g | Freq: Four times a day (QID) | INTRAVENOUS | Status: AC
  Administered 2024-02-19 (×2): 2 g via INTRAVENOUS
  Filled 2024-02-19 (×2): qty 100

## 2024-02-19 MED ORDER — FENTANYL CITRATE (PF) 250 MCG/5ML IJ SOLN
INTRAMUSCULAR | Status: DC | PRN
Start: 2024-02-19 — End: 2024-02-19
  Administered 2024-02-19 (×2): 50 ug via INTRAVENOUS

## 2024-02-19 MED ORDER — DROPERIDOL 2.5 MG/ML IJ SOLN: 0.6250 mg | Freq: Once | INTRAMUSCULAR | Status: DC | PRN

## 2024-02-19 MED ORDER — ALUM & MAG HYDROXIDE-SIMETH 200-200-20 MG/5ML PO SUSP: 30.0000 mL | ORAL | Status: DC | PRN

## 2024-02-19 MED ORDER — MENTHOL 3 MG MT LOZG: 1.0000 | LOZENGE | OROMUCOSAL | Status: DC | PRN

## 2024-02-19 MED ORDER — HYDROMORPHONE HCL 1 MG/ML IJ SOLN
0.5000 mg | INTRAMUSCULAR | Status: DC | PRN
Start: 2024-02-19 — End: 2024-02-21
  Administered 2024-02-19 – 2024-02-20 (×3): 1 mg via INTRAVENOUS
  Filled 2024-02-19 (×3): qty 1

## 2024-02-19 MED ORDER — PHENYLEPHRINE HCL-NACL 20-0.9 MG/250ML-% IV SOLN
INTRAVENOUS | Status: DC | PRN
  Administered 2024-02-19: 50 ug/min via INTRAVENOUS

## 2024-02-19 MED ORDER — SODIUM CHLORIDE 0.9 % IR SOLN
Status: DC | PRN
  Administered 2024-02-19: 1000 mL

## 2024-02-19 MED ORDER — DIPHENHYDRAMINE HCL 12.5 MG/5ML PO ELIX: 12.5000 mg | ORAL_SOLUTION | ORAL | Status: DC | PRN

## 2024-02-19 MED ORDER — TRANEXAMIC ACID-NACL 1000-0.7 MG/100ML-% IV SOLN
1000.0000 mg | INTRAVENOUS | Status: AC
  Administered 2024-02-19: 1000 mg via INTRAVENOUS
  Filled 2024-02-19: qty 100

## 2024-02-19 MED ORDER — ONDANSETRON HCL 4 MG PO TABS: 4.0000 mg | ORAL_TABLET | Freq: Four times a day (QID) | ORAL | Status: DC | PRN

## 2024-02-19 MED ORDER — ASPIRIN 81 MG PO CHEW
81.0000 mg | CHEWABLE_TABLET | Freq: Two times a day (BID) | ORAL | Status: DC
Start: 2024-02-19 — End: 2024-02-21
  Administered 2024-02-19 – 2024-02-21 (×4): 81 mg via ORAL
  Filled 2024-02-19 (×4): qty 1

## 2024-02-19 MED ORDER — ONDANSETRON HCL 4 MG/2ML IJ SOLN
INTRAMUSCULAR | Status: DC | PRN
  Administered 2024-02-19: 4 mg via INTRAVENOUS

## 2024-02-19 MED ORDER — OXYCODONE HCL 5 MG PO TABS: 5.0000 mg | ORAL_TABLET | Freq: Once | ORAL | Status: DC | PRN

## 2024-02-19 MED ORDER — ORAL CARE MOUTH RINSE: 15.0000 mL | Freq: Once | OROMUCOSAL | Status: AC

## 2024-02-19 MED ORDER — MIDAZOLAM HCL 2 MG/2ML IJ SOLN
INTRAMUSCULAR | Status: AC
Start: 2024-02-19 — End: ?
  Filled 2024-02-19: qty 2

## 2024-02-19 MED ORDER — ACETAMINOPHEN 325 MG PO TABS
325.0000 mg | ORAL_TABLET | Freq: Four times a day (QID) | ORAL | Status: DC | PRN
  Administered 2024-02-20: 650 mg via ORAL
  Filled 2024-02-19: qty 2

## 2024-02-19 MED ORDER — LACTATED RINGERS IV SOLN: INTRAVENOUS | Status: DC | PRN

## 2024-02-19 MED ORDER — LACTATED RINGERS IV SOLN: INTRAVENOUS | Status: DC

## 2024-02-19 MED ORDER — PHENYLEPHRINE 80 MCG/ML (10ML) SYRINGE FOR IV PUSH (FOR BLOOD PRESSURE SUPPORT)
PREFILLED_SYRINGE | INTRAVENOUS | Status: DC | PRN
  Administered 2024-02-19: 80 ug via INTRAVENOUS

## 2024-02-19 MED ORDER — SODIUM CHLORIDE 0.9 % IV SOLN: INTRAVENOUS | Status: DC

## 2024-02-19 MED ORDER — CHLORHEXIDINE GLUCONATE 0.12 % MT SOLN
15.0000 mL | Freq: Once | OROMUCOSAL | Status: AC
  Administered 2024-02-19: 15 mL via OROMUCOSAL
  Filled 2024-02-19: qty 15

## 2024-02-19 MED ORDER — MIDAZOLAM HCL 2 MG/2ML IJ SOLN
INTRAMUSCULAR | Status: DC | PRN
  Administered 2024-02-19 (×2): 1 mg via INTRAVENOUS

## 2024-02-19 MED ORDER — OXYCODONE HCL 5 MG PO TABS
10.0000 mg | ORAL_TABLET | ORAL | Status: DC | PRN
  Administered 2024-02-19 (×2): 10 mg via ORAL
  Administered 2024-02-20 – 2024-02-21 (×4): 15 mg via ORAL
  Filled 2024-02-19 (×4): qty 3

## 2024-02-19 MED ORDER — OXYCODONE HCL 5 MG/5ML PO SOLN: 5.0000 mg | Freq: Once | ORAL | Status: DC | PRN

## 2024-02-19 MED ORDER — MOMETASONE FURO-FORMOTEROL FUM 200-5 MCG/ACT IN AERO
2.0000 | INHALATION_SPRAY | Freq: Two times a day (BID) | RESPIRATORY_TRACT | Status: DC
  Administered 2024-02-19 – 2024-02-20 (×3): 2 via RESPIRATORY_TRACT
  Filled 2024-02-19: qty 8.8

## 2024-02-19 MED ORDER — BISOPROLOL FUMARATE 5 MG PO TABS
5.0000 mg | ORAL_TABLET | Freq: Every day | ORAL | Status: DC
  Administered 2024-02-20 – 2024-02-21 (×2): 5 mg via ORAL
  Filled 2024-02-19 (×2): qty 1

## 2024-02-19 MED ORDER — METOCLOPRAMIDE HCL 5 MG PO TABS: 5.0000 mg | ORAL_TABLET | Freq: Three times a day (TID) | ORAL | Status: DC | PRN

## 2024-02-19 MED ORDER — OXYCODONE HCL 5 MG PO TABS
5.0000 mg | ORAL_TABLET | ORAL | Status: DC | PRN
  Administered 2024-02-20 (×2): 10 mg via ORAL
  Filled 2024-02-19 (×3): qty 2

## 2024-02-19 MED ORDER — DEXAMETHASONE SODIUM PHOSPHATE 10 MG/ML IJ SOLN
INTRAMUSCULAR | Status: DC | PRN
  Administered 2024-02-19: 10 mg via INTRAVENOUS

## 2024-02-19 MED ORDER — FENTANYL CITRATE (PF) 100 MCG/2ML IJ SOLN: 25.0000 ug | INTRAMUSCULAR | Status: DC | PRN

## 2024-02-19 MED ORDER — BUPIVACAINE IN DEXTROSE 0.75-8.25 % IT SOLN
INTRATHECAL | Status: DC | PRN
  Administered 2024-02-19: 1.6 mL via INTRATHECAL

## 2024-02-19 MED ORDER — PANTOPRAZOLE SODIUM 40 MG PO TBEC
40.0000 mg | DELAYED_RELEASE_TABLET | Freq: Every day | ORAL | Status: DC
  Administered 2024-02-19 – 2024-02-21 (×3): 40 mg via ORAL
  Filled 2024-02-19 (×3): qty 1

## 2024-02-19 MED ORDER — ONDANSETRON HCL 4 MG/2ML IJ SOLN: 4.0000 mg | Freq: Four times a day (QID) | INTRAMUSCULAR | Status: DC | PRN

## 2024-02-19 MED ORDER — DOCUSATE SODIUM 100 MG PO CAPS
100.0000 mg | ORAL_CAPSULE | Freq: Two times a day (BID) | ORAL | Status: DC
  Administered 2024-02-20 – 2024-02-21 (×3): 100 mg via ORAL
  Filled 2024-02-19 (×4): qty 1

## 2024-02-19 SURGICAL SUPPLY — 46 items
BAG COUNTER SPONGE SURGICOUNT (BAG) ×1 IMPLANT
BENZOIN TINCTURE PRP APPL 2/3 (GAUZE/BANDAGES/DRESSINGS) ×1 IMPLANT
BLADE CLIPPER SURG (BLADE) IMPLANT
BLADE SAW SGTL 18X1.27X75 (BLADE) ×1 IMPLANT
COVER SURGICAL LIGHT HANDLE (MISCELLANEOUS) ×1 IMPLANT
CUP SECTOR GRIPTON 58MM (Orthopedic Implant) IMPLANT
DRAPE C-ARM 42X72 X-RAY (DRAPES) ×1 IMPLANT
DRAPE STERI IOBAN 125X83 (DRAPES) ×1 IMPLANT
DRAPE U-SHAPE 47X51 STRL (DRAPES) ×3 IMPLANT
DRSG AQUACEL AG ADV 3.5X10 (GAUZE/BANDAGES/DRESSINGS) ×1 IMPLANT
DURAPREP 26ML APPLICATOR (WOUND CARE) ×1 IMPLANT
ELECT BLADE 4.0 EZ CLEAN MEGAD (MISCELLANEOUS) ×1 IMPLANT
ELECT BLADE 6.5 EXT (BLADE) IMPLANT
ELECT REM PT RETURN 9FT ADLT (ELECTROSURGICAL) ×1 IMPLANT
ELECTRODE BLDE 4.0 EZ CLN MEGD (MISCELLANEOUS) ×1 IMPLANT
ELECTRODE REM PT RTRN 9FT ADLT (ELECTROSURGICAL) ×1 IMPLANT
FACESHIELD WRAPAROUND (MASK) ×2 IMPLANT
FACESHIELD WRAPAROUND OR TEAM (MASK) ×2 IMPLANT
GLOVE BIOGEL PI IND STRL 8 (GLOVE) ×2 IMPLANT
GLOVE ECLIPSE 8.0 STRL XLNG CF (GLOVE) ×1 IMPLANT
GLOVE ORTHO TXT STRL SZ7.5 (GLOVE) ×2 IMPLANT
GOWN STRL REUS W/ TWL LRG LVL3 (GOWN DISPOSABLE) ×2 IMPLANT
GOWN STRL REUS W/ TWL XL LVL3 (GOWN DISPOSABLE) ×2 IMPLANT
HEAD CERAMIC 36 PLUS5 (Hips) IMPLANT
KIT BASIN OR (CUSTOM PROCEDURE TRAY) ×1 IMPLANT
KIT TURNOVER KIT B (KITS) ×1 IMPLANT
LINER NEUTRAL 36X58 PLUS4 IMPLANT
MANIFOLD NEPTUNE II (INSTRUMENTS) ×1 IMPLANT
NS IRRIG 1000ML POUR BTL (IV SOLUTION) ×1 IMPLANT
PACK TOTAL JOINT (CUSTOM PROCEDURE TRAY) ×1 IMPLANT
PAD ARMBOARD POSITIONER FOAM (MISCELLANEOUS) ×1 IMPLANT
SET HNDPC FAN SPRY TIP SCT (DISPOSABLE) ×1 IMPLANT
STAPLER VISISTAT 35W (STAPLE) IMPLANT
STEM FEM ACTIS HIGH SZ8 (Stem) IMPLANT
STRIP CLOSURE SKIN 1/2X4 (GAUZE/BANDAGES/DRESSINGS) ×2 IMPLANT
SUT ETHIBOND NAB CT1 #1 30IN (SUTURE) ×1 IMPLANT
SUT MNCRL AB 4-0 PS2 18 (SUTURE) IMPLANT
SUT VIC AB 0 CT1 27XBRD ANBCTR (SUTURE) ×1 IMPLANT
SUT VIC AB 1 CT1 27XBRD ANBCTR (SUTURE) ×1 IMPLANT
SUT VIC AB 2-0 CT1 TAPERPNT 27 (SUTURE) ×1 IMPLANT
TOWEL GREEN STERILE (TOWEL DISPOSABLE) ×1 IMPLANT
TOWEL GREEN STERILE FF (TOWEL DISPOSABLE) ×1 IMPLANT
TRAY CATH INTERMITTENT SS 16FR (CATHETERS) IMPLANT
TRAY FOLEY W/BAG SLVR 14FR (SET/KITS/TRAYS/PACK) IMPLANT
TRAY FOLEY W/BAG SLVR 16FR ST (SET/KITS/TRAYS/PACK) IMPLANT
WATER STERILE IRR 1000ML POUR (IV SOLUTION) ×2 IMPLANT

## 2024-02-19 NOTE — Discharge Instructions (Signed)
 Per [facility] clinic policy, our goal is ensure optimal postoperative pain control with a multimodal pain management strategy. For all [facility] patients, our goal is to wean post-operative narcotic medications by 6 weeks post-operatively. If this is not possible due to utilization of pain medication prior to surgery, your [facility] doctor will support your acute post-operative pain control for the first 6 weeks postoperatively, with a plan to transition you back to your primary pain team following that. Aaron Clarke will work to ensure a Therapist, occupational.  INSTRUCTIONS AFTER JOINT REPLACEMENT   Remove items at home which could result in a fall. This includes throw rugs or furniture in walking pathways ICE to the affected joint every three hours while awake for 30 minutes at a time, for at least the first 3-5 days, and then as needed for pain and swelling.  Continue to use ice for pain and swelling. You may notice swelling that will progress down to the foot and ankle.  This is normal after surgery.  Elevate your leg when you are not up walking on it.   Continue to use the breathing machine you got in the hospital (incentive spirometer) which will help keep your temperature down.  It is common for your temperature to cycle up and down following surgery, especially at night when you are not up moving around and exerting yourself.  The breathing machine keeps your lungs expanded and your temperature down.   DIET:  As you were doing prior to hospitalization, we recommend a well-balanced diet.  DRESSING / WOUND CARE / SHOWERING  Keep the surgical dressing until follow up.  The dressing is water proof, so you can shower without any extra covering.  IF THE DRESSING FALLS OFF or the wound gets wet inside, change the dressing with sterile gauze.  Please use good hand washing techniques before changing the dressing.  Do not use any lotions or creams on the incision until instructed by your surgeon.     ACTIVITY  Increase activity slowly as tolerated, but follow the weight bearing instructions below.   No driving for 6 weeks or until further direction given by your physician.  You cannot drive while taking narcotics.  No lifting or carrying greater than 10 lbs. until further directed by your surgeon. Avoid periods of inactivity such as sitting longer than an hour when not asleep. This helps prevent blood clots.  You may return to work once you are authorized by your doctor.     WEIGHT BEARING   Weight bearing as tolerated with assist device (walker, cane, etc) as directed, use it as long as suggested by your surgeon or therapist, typically at least 4-6 weeks.   EXERCISES  Results after joint replacement surgery are often greatly improved when you follow the exercise, range of motion and muscle strengthening exercises prescribed by your doctor. Safety measures are also important to protect the joint from further injury. Any time any of these exercises cause you to have increased pain or swelling, decrease what you are doing until you are comfortable again and then slowly increase them. If you have problems or questions, call your caregiver or physical therapist for advice.   Rehabilitation is important following a joint replacement. After just a few days of immobilization, the muscles of the leg can become weakened and shrink (atrophy).  These exercises are designed to build up the tone and strength of the thigh and leg muscles and to improve motion. Often times heat used for twenty to thirty minutes before  working out will loosen up your tissues and help with improving the range of motion but do not use heat for the first two weeks following surgery (sometimes heat can increase post-operative swelling).   These exercises can be done on a training (exercise) mat, on the floor, on a table or on a bed. Use whatever works the best and is most comfortable for you.    Use music or television  while you are exercising so that the exercises are a pleasant break in your day. This will make your life better with the exercises acting as a break in your routine that you can look forward to.   Perform all exercises about fifteen times, three times per day or as directed.  You should exercise both the operative leg and the other leg as well.  Exercises include:   Quad Sets - Tighten up the muscle on the front of the thigh (Quad) and hold for 5-10 seconds.   Straight Leg Raises - With your knee straight (if you were given a brace, keep it on), lift the leg to 60 degrees, hold for 3 seconds, and slowly lower the leg.  Perform this exercise against resistance later as your leg gets stronger.  Leg Slides: Lying on your back, slowly slide your foot toward your buttocks, bending your knee up off the floor (only go as far as is comfortable). Then slowly slide your foot back down until your leg is flat on the floor again.  Angel Wings: Lying on your back spread your legs to the side as far apart as you can without causing discomfort.  Hamstring Strength:  Lying on your back, push your heel against the floor with your leg straight by tightening up the muscles of your buttocks.  Repeat, but this time bend your knee to a comfortable angle, and push your heel against the floor.  You may put a pillow under the heel to make it more comfortable if necessary.   A rehabilitation program following joint replacement surgery can speed recovery and prevent re-injury in the future due to weakened muscles. Contact your doctor or a physical therapist for more information on knee rehabilitation.    CONSTIPATION  Constipation is defined medically as fewer than three stools per week and severe constipation as less than one stool per week.  Even if you have a regular bowel pattern at home, your normal regimen is likely to be disrupted due to multiple reasons following surgery.  Combination of anesthesia, postoperative  narcotics, change in appetite and fluid intake all can affect your bowels.   YOU MUST use at least one of the following options; they are listed in order of increasing strength to get the job done.  They are all available over the counter, and you may need to use some, POSSIBLY even all of these options:    Drink plenty of fluids (prune juice may be helpful) and high fiber foods Colace 100 mg by mouth twice a day  Senokot for constipation as directed and as needed Dulcolax (bisacodyl), take with full glass of water  Miralax (polyethylene glycol) once or twice a day as needed.  If you have tried all these things and are unable to have a bowel movement in the first 3-4 days after surgery call either your surgeon or your primary doctor.    If you experience loose stools or diarrhea, hold the medications until you stool forms back up.  If your symptoms do not get better within 1 week  or if they get worse, check with your doctor.  If you experience "the worst abdominal pain ever" or develop nausea or vomiting, please contact the office immediately for further recommendations for treatment.   ITCHING:  If you experience itching with your medications, try taking only a single pain pill, or even half a pain pill at a time.  You can also use Benadryl over the counter for itching or also to help with sleep.   TED HOSE STOCKINGS:  Use stockings on both legs until for at least 2 weeks or as directed by physician office. They may be removed at night for sleeping.  MEDICATIONS:  See your medication summary on the "After Visit Summary" that nursing will review with you.  You may have some home medications which will be placed on hold until you complete the course of blood thinner medication.  It is important for you to complete the blood thinner medication as prescribed.  PRECAUTIONS:  If you experience chest pain or shortness of breath - call 911 immediately for transfer to the hospital emergency department.    If you develop a fever greater that 101 F, purulent drainage from wound, increased redness or drainage from wound, foul odor from the wound/dressing, or calf pain - CONTACT YOUR SURGEON.                                                   FOLLOW-UP APPOINTMENTS:  If you do not already have a post-op appointment, please call the office for an appointment to be seen by your surgeon.  Guidelines for how soon to be seen are listed in your "After Visit Summary", but are typically between 1-4 weeks after surgery.  OTHER INSTRUCTIONS:   Knee Replacement:  Do not place pillow under knee, focus on keeping the knee straight while resting. CPM instructions: 0-90 degrees, 2 hours in the morning, 2 hours in the afternoon, and 2 hours in the evening. Place foam block, curve side up under heel at all times except when in CPM or when walking.  DO NOT modify, tear, cut, or change the foam block in any way.  POST-OPERATIVE OPIOID TAPER INSTRUCTIONS: It is important to wean off of your opioid medication as soon as possible. If you do not need pain medication after your surgery it is ok to stop day one. Opioids include: Codeine, Hydrocodone(Norco, Vicodin), Oxycodone(Percocet, oxycontin) and hydromorphone amongst others.  Long term and even short term use of opiods can cause: Increased pain response Dependence Constipation Depression Respiratory depression And more.  Withdrawal symptoms can include Flu like symptoms Nausea, vomiting And more Techniques to manage these symptoms Hydrate well Eat regular healthy meals Stay active Use relaxation techniques(deep breathing, meditating, yoga) Do Not substitute Alcohol to help with tapering If you have been on opioids for less than two weeks and do not have pain than it is ok to stop all together.  Plan to wean off of opioids This plan should start within one week post op of your joint replacement. Maintain the same interval or time between taking each dose  and first decrease the dose.  Cut the total daily intake of opioids by one tablet each day Next start to increase the time between doses. The last dose that should be eliminated is the evening dose.   MAKE SURE YOU:  Understand these instructions.  Get help right away if you are not doing well or get worse.    Thank you for letting us be a part of your medical care team.  It is a privilege we respect greatly.  We hope these instructions will help you stay on track for a fast and full recovery!      Dental Antibiotics:  In most cases prophylactic antibiotics for Dental procdeures after total joint surgery are not necessary.  Exceptions are as follows:  1. History of prior total joint infection  2. Severely immunocompromised (Organ Transplant, cancer chemotherapy, Rheumatoid biologic meds such as Humera)  3. Poorly controlled diabetes (A1C &gt; 8.0, blood glucose over 200)  If you have one of these conditions, contact your surgeon for an antibiotic prescription, prior to your dental procedure.

## 2024-02-19 NOTE — Anesthesia Procedure Notes (Signed)
 Spinal  Patient location during procedure: OR Start time: 02/19/2024 10:18 AM End time: 02/19/2024 10:21 AM Reason for block: surgical anesthesia Staffing Performed: anesthesiologist  Anesthesiologist: Kaylyn Layer, MD Performed by: Kaylyn Layer, MD Authorized by: Kaylyn Layer, MD   Preanesthetic Checklist Completed: patient identified, IV checked, risks and benefits discussed, surgical consent, monitors and equipment checked, pre-op evaluation and timeout performed Spinal Block Patient position: sitting Prep: DuraPrep and site prepped and draped Patient monitoring: continuous pulse ox, blood pressure and heart rate Approach: right paramedian Location: L3-4 Injection technique: single-shot Needle Needle type: Pencan  Needle gauge: 24 G Needle length: 9 cm Assessment Events: CSF return Additional Notes Risks, benefits, and alternative discussed. Patient gave consent to procedure. Prepped and draped in sitting position. Patient sedated but responsive to voice. Clear CSF obtained on second attempt (first attempt midline with persistent os encountered). Positive terminal aspiration. No pain or paraesthesias with injection. Patient tolerated procedure well. Vital signs stable. Amalia Greenhouse, MD

## 2024-02-19 NOTE — Anesthesia Postprocedure Evaluation (Signed)
 Anesthesia Post Note  Patient: Aaron Clarke  Procedure(s) Performed: ARTHROPLASTY, HIP, TOTAL, ANTERIOR APPROACH (Right: Hip)     Patient location during evaluation: PACU Anesthesia Type: Spinal Level of consciousness: awake and alert Pain management: pain level controlled Vital Signs Assessment: post-procedure vital signs reviewed and stable Respiratory status: spontaneous breathing, nonlabored ventilation and respiratory function stable Cardiovascular status: blood pressure returned to baseline Postop Assessment: no apparent nausea or vomiting, spinal receding, no headache and no backache Anesthetic complications: no   There were no known notable events for this encounter.  Last Vitals:  Vitals:   02/19/24 1330 02/19/24 1400  BP: (!) 142/66 (!) 144/74  Pulse: 60 62  Resp: 14 14  Temp: (!) 36.4 C   SpO2: 100% 99%              Shanda Howells

## 2024-02-19 NOTE — Plan of Care (Signed)
  Problem: Education: Goal: Knowledge of General Education information will improve Description: Including pain rating scale, medication(s)/side effects and non-pharmacologic comfort measures Outcome: Progressing   Problem: Activity: Goal: Risk for activity intolerance will decrease Outcome: Not Progressing   Problem: Pain Managment: Goal: General experience of comfort will improve and/or be controlled Outcome: Progressing

## 2024-02-19 NOTE — Op Note (Signed)
 Operative Note  Date of operation: 02/19/2024 Preoperative diagnosis: Right hip primary osteoarthritis Postoperative diagnosis: Same  Procedure: Right direct anterior total hip arthroplasty  Implants: Implant Name Type Inv. Item Serial No. Manufacturer Lot No. LRB No. Used Action  CUP SECTOR GRIPTON - [id number] Orthopedic Implant CUP SECTOR GRIPTON  DEPUY [employer]TH[employer] [id number] Right 1 Implanted  LINER NEUTRAL 36X58 PLUS4 - [id number]  LINER NEUTRAL 36X58 PLUS4  DEPUY ORTHOPAEDICS [id number] Right 1 Implanted  STEM FEM ACTIS HIGH SZ8 - [id number] Stem STEM FEM ACTIS HIGH SZ8  DEPUY ORTHOPAEDICS [id number] Right 1 Implanted  HEAD CERAMIC 36 PLUS5 - [id number] Hips HEAD CERAMIC 36 PLUS5  DEPUY ORTHOPAEDICS [id number] Right 1 Implanted   Surgeon: Vanita Panda. Magnus Ivan, MD Assistant: Rexene Edison, PA-C  Anesthesia: Spinal EBL: 300 cc Antibiotics: IV Ancef Complications: None  Indications: The patient is a 68 year old morbidly obese gentleman with unfortunately debilitating arthritis involving his right hip.  He actually is developing some left hip arthritis but his right hip pain is severe and is daily.  It is 10 out of 10.  It is detrimentally affecting his mobility, his quality of life and his actives daily living.  He is very stiff on clinical exam with severe pain in the groin with rotation of his right hip and his x-rays show bone-on-bone wear.  He has tried and failed all forms conservative treatment.  He has been on a weight loss journey and he has lost weight in preparation for surgery.  He does wish to proceed with a total hip arthroplasty and we agree with this as well.  We did discuss the risks of acute blood loss anemia, nerve or vessel injury, fracture, infection, DVT, dislocation, implant failure, leg length differences and wound healing issues.  He understands that our goals are hopefully decreased pain, improved mobility and improved quality of life.  Procedure description: After  informed consent was obtained and the appropriate right hip was marked, the patient was brought to the operating room and set up on the stretcher where spinal anesthesia was obtained.  He was laid in supine position on stretcher and a Foley catheter was placed.  Traction boots were placed on both his feet and next he was placed supine on the Hana fracture table with a perineal post and placed in both legs in inline skeletal traction devices but no traction applied.  His right operative hip and pelvis were assessed radiographically.  The right hip was prepped and draped with DuraPrep and sterile drapes.  A timeout was called and he was identified as the correct patient and the correct right hip.  An incision was then made just inferior and posterior to the ASIS and carried slightly obliquely down the leg.  Dissection was carried down to the tensor fascia lata muscle and the tensor fascia was divided longitudinally to proceed with a direct anterior approach to the hip.  Circumflex vessels were identified and cauterized.  The hip capsule was identified and opened up in L-type format.  Cobra retractors were then placed around the medial and lateral femoral neck and a femoral neck cut was made with an oscillating saw just proximal to the lesser trochanter and this cut was completed with an osteotome.  A corkscrew guide was placed in the femoral head and the femoral head was removed in its entirety and there was a wide area devoid of cartilage.  A bent Hohmann was then placed over the medial acetabular rim and remnants of the acetabular labrum and other  debris removed.  Reaming was then initiated under direct visualization from a size 43 reamer and stepwise increments going up to a size 57 reamer with all reamers placed under direct visualization and the last reamer also placed under direct fluoroscopy in order to obtain the depth and reaming, the inclination and the anteversion.  The real DePuy Sectra GRIPTION acetabular  component size 58 was then placed without difficulty followed by a 36+4 polyethylene liner.  Attention was then turned to the femur.  With the right leg externally rotated to 120 degrees, extended and adducted, a Mueller retractor was placed medially and a Hohmann retractor behind the greater trochanter.  The lateral joint capsule was released and a box cutting osteotome was used to enter the femoral canal.  Broaching was then initiated using the Actis broaching system from a size 0 going up to a size 8.  With size 8 in place we trialed a high offset femoral neck based off his anatomy and a 36+1.5 trial hip ball.  The right leg was brought over and up and with traction and internal rotation reduced in the pelvis.  We felt like we needed more leg length based on his arthritis on his other hip that may need replacing in the future.  We dislocated the hip and removed the trial components.  We then placed the real Actis femoral component size 8 with high offset and went with a 36+5 ceramic head ball.  Again we reduce this and asked to have him we are pleased with leg length, offset, range of motion and stability assessed both mechanically and radiographically.  The soft tissue was then irrigated with normal saline solution.  Remnants of the joint capsule were closed with interrupted #1 Ethibond suture followed by #1 Vicryl to close the tensor fascia.  0 Vicryl was used to close the deep tissue and 2-0 Vicryl was used to close subcutaneous tissue.  The skin was closed with staples.  An Aquacel dressing was applied.  The patient was taken off of the Hana table and taken to the recovery room.  Rexene Edison, PA-C did assist during the entire case and beginning to end and his assistance was crucial and medically necessary for soft tissue management and retraction, helping guide implant placement and a layered closure of the wound.

## 2024-02-19 NOTE — Interval H&P Note (Signed)
 History and Physical Interval Note: The patient understands that he is here today for a right total hip arthroplasty to treat his severe right hip arthritis and pain.  There has been no acute or interval change in his medical status.  The risks and benefits of surgery have been discussed in detail and informed consent has been obtained.  The right operative hip has been marked.  02/19/2024 8:53 AM  Aaron Clarke  has presented today for surgery, with the diagnosis of osteoarthritis right hip.  The various methods of treatment have been discussed with the patient and family. After consideration of risks, benefits and other options for treatment, the patient has consented to  Procedure(s): ARTHROPLASTY, HIP, TOTAL, ANTERIOR APPROACH (Right) as a surgical intervention.  The patient's history has been reviewed, patient examined, no change in status, stable for surgery.  I have reviewed the patient's chart and labs.  Questions were answered to the patient's satisfaction.     Kathryne Hitch

## 2024-02-19 NOTE — Progress Notes (Signed)
 PT Cancellation Note  Patient Details Name: Aaron Clarke MRN: [id number] DOB: [birth year]   Cancelled Treatment:    Reason Eval/Treat Not Completed: Pain limiting ability to participate; patient in excruciating pain at the moment reporting back and R scrotum pain.  RN aware.  Will attempt another day.   Elray Mcgregor 02/19/2024, 3:56 PM Sheran Lawless, PT Acute Rehabilitation Services [phone] 02/19/2024

## 2024-02-19 NOTE — Transfer of Care (Signed)
 Immediate Anesthesia Transfer of Care Note  Patient: Aaron Clarke  Procedure(s) Performed: ARTHROPLASTY, HIP, TOTAL, ANTERIOR APPROACH (Right: Hip)  Patient Location: PACU  Anesthesia Type:Spinal  Level of Consciousness: drowsy  Airway & Oxygen Therapy: Patient Spontanous Breathing  Post-op Assessment: Post -op Vital signs reviewed and stable  Post vital signs: stable  Last Vitals:  Vitals Value Taken Time  BP    Temp    Pulse    Resp    SpO2      Last Pain:  Vitals:   02/19/24 0841  TempSrc:   PainSc: 0-No pain         Complications: There were no known notable events for this encounter.

## 2024-02-20 ENCOUNTER — Encounter (HOSPITAL_COMMUNITY): Payer: Self-pay | Admitting: Orthopaedic Surgery

## 2024-02-20 DIAGNOSIS — M1611 Unilateral primary osteoarthritis, right hip: Secondary | ICD-10-CM | POA: Diagnosis not present

## 2024-02-20 LAB — CBC
HCT: 40.8 % (ref 39.0–52.0)
Hemoglobin: 13.5 g/dL (ref 13.0–17.0)
MCH: 29.7 pg (ref 26.0–34.0)
MCHC: 33.1 g/dL (ref 30.0–36.0)
MCV: 89.7 fL (ref 80.0–100.0)
Platelets: 139 10*3/uL — ABNORMAL LOW (ref 150–400)
RBC: 4.55 MIL/uL (ref 4.22–5.81)
RDW: 13.9 % (ref 11.5–15.5)
WBC: 8.5 10*3/uL (ref 4.0–10.5)
nRBC: 0 % (ref 0.0–0.2)

## 2024-02-20 LAB — BASIC METABOLIC PANEL WITH GFR
Anion gap: 8 (ref 5–15)
BUN: 25 mg/dL — ABNORMAL HIGH (ref 8–23)
CO2: 20 mmol/L — ABNORMAL LOW (ref 22–32)
Calcium: 8.4 mg/dL — ABNORMAL LOW (ref 8.9–10.3)
Chloride: 106 mmol/L (ref 98–111)
Creatinine, Ser: 1.52 mg/dL — ABNORMAL HIGH (ref 0.61–1.24)
GFR, Estimated: 51 mL/min — ABNORMAL LOW (ref >60)
Glucose, Bld: 138 mg/dL — ABNORMAL HIGH (ref 70–99)
Potassium: 4.3 mmol/L (ref 3.5–5.1)
Sodium: 134 mmol/L — ABNORMAL LOW (ref 135–145)

## 2024-02-20 NOTE — Care Management Obs Status (Signed)
 MEDICARE OBSERVATION STATUS NOTIFICATION   Patient Details  Name: Aaron Clarke MRN: [id number] Date of Birth: [birth year]   Medicare Observation Status Notification Given:  Yes  Moom Letter Given  Dorena Bodo 02/20/2024, 2:46 PM

## 2024-02-20 NOTE — Progress Notes (Signed)
 Physical Therapy Treatment Patient Details Name: Aaron Clarke MRN: [id number] DOB: 04/27/1956 Today's Date: 02/20/2024   History of Present Illness 68 y.o. male presents to [facility] hospital on 02/19/2024 for elective R THA. PMH includes Asthma, CKD, DVT, HTN, PE.    PT Comments  Pt is limited by R hip pain but does ambulate for increased distances. Pt requires assistance for transfers and demonstrates impaired R foot clearance when ambulating due to lack of RLE strength. PT reinforces HEP, with emphasis on quad sets, heel slides and hip abduction/adduction to improve functional mobility. Pt will need to demonstrate improved ambulation tolerance and be able to ascend 6 stairs to enter his home. PT will follow up tomorrow for further gait training along with initiation of stair training.    If plan is discharge home, recommend the following: A little help with walking and/or transfers;A lot of help with bathing/dressing/bathroom;Assistance with cooking/housework;Assist for transportation;Help with stairs or ramp for entrance   Can travel by private vehicle        Equipment Recommendations  Rolling walker (2 wheels);BSC/3in1    Recommendations for Other Services       Precautions / Restrictions Precautions Precautions: Fall Recall of Precautions/Restrictions: Intact Precaution/Restrictions Comments: direct anterior THA Restrictions Weight Bearing Restrictions Per Provider Order: Yes RLE Weight Bearing Per Provider Order: Weight bearing as tolerated     Mobility  Bed Mobility Overal bed mobility: Needs Assistance Bed Mobility: Supine to Sit     Supine to sit: Min assist, HOB elevated, Used rails     General bed mobility comments: support for RLE, heavy use of railing    Transfers Overall transfer level: Needs assistance Equipment used: Rolling walker (2 wheels) Transfers: Sit to/from Stand Sit to Stand: Min assist, From elevated surface           General transfer  comment: verbal cues for hand placement and to increase anterior trunk lean and weight shift    Ambulation/Gait Ambulation/Gait assistance: Contact guard assist Gait Distance (Feet): 50 Feet Assistive device: Rolling walker (2 wheels) Gait Pattern/deviations: Step-to pattern, Decreased dorsiflexion - right Gait velocity: reduced Gait velocity interpretation: <1.31 ft/sec, indicative of household ambulator   General Gait Details: pt with slowed step-to gait, reduced stance time on RLE. Pt also with reduced R foot clearance, related to impaired hip flexor strength.   Stairs             Wheelchair Mobility     Tilt Bed    Modified Rankin (Stroke Patients Only)       Balance Overall balance assessment: Needs assistance Sitting-balance support: No upper extremity supported, Feet supported Sitting balance-Leahy Scale: Good     Standing balance support: Bilateral upper extremity supported, Reliant on assistive device for balance Standing balance-Leahy Scale: Poor                              Communication Communication Communication: No apparent difficulties  Cognition Arousal: Alert Behavior During Therapy: WFL for tasks assessed/performed   PT - Cognitive impairments: No apparent impairments                         Following commands: Intact      Cueing Cueing Techniques: Verbal cues  Exercises Other Exercises Other Exercises: PT provides education on surgical hip exercise packet    General Comments General comments (skin integrity, edema, etc.): VSS on RA  Pertinent Vitals/Pain Pain Assessment Pain Assessment: 0-10 Pain Score: 5  Pain Location: R hip Pain Descriptors / Indicators: Burning Pain Intervention(s): Monitored during session    Home Living                          Prior Function            PT Goals (current goals can now be found in the care plan section) Acute Rehab PT Goals Patient Stated Goal:  to return to independence Progress towards PT goals: Progressing toward goals    Frequency    7X/week      PT Plan      Co-evaluation              AM-PAC PT "6 Clicks" Mobility   Outcome Measure  Help needed turning from your back to your side while in a flat bed without using bedrails?: A Little Help needed moving from lying on your back to sitting on the side of a flat bed without using bedrails?: A Lot Help needed moving to and from a bed to a chair (including a wheelchair)?: A Little Help needed standing up from a chair using your arms (e.g., wheelchair or bedside chair)?: A Little Help needed to walk in hospital room?: A Little Help needed climbing 3-5 steps with a railing? : Total 6 Click Score: 15    End of Session Equipment Utilized During Treatment: Gait belt Activity Tolerance: Patient tolerated treatment well Patient left: in chair;with call bell/phone within reach;with family/visitor present Nurse Communication: Mobility status PT Visit Diagnosis: Other abnormalities of gait and mobility (R26.89);Muscle weakness (generalized) (M62.81)     Time: 0272-5366 PT Time Calculation (min) (ACUTE ONLY): 20 min  Charges:    $Gait Training: 8-22 mins PT General Charges $$ ACUTE PT VISIT: 1 Visit                     Arlyss Gandy, PT, DPT Acute Rehabilitation Office [phone]    Arlyss Gandy 02/20/2024, 3:27 PM

## 2024-02-20 NOTE — Evaluation (Signed)
 Physical Therapy Evaluation Patient Details Name: Aaron Clarke MRN: [id number] DOB: 1956/07/20 Today's Date: 02/20/2024  History of Present Illness  68 y.o. male presents to [facility] hospital on 02/19/2024 for elective R THA. PMH includes Asthma, CKD, DVT, HTN, PE.  Clinical Impression  Pt presents to PT with deficits in strength, power, gait, balance, functional mobility, endurance. Pt is able to transfer and ambulate for short distances with support of RW, limited by R hip pain at incision site. Pt does require physical assistance for RLE at bed-level, PT provides education on surgical hip exercise packet to improve RLE muscle activation. Pt will need to improve mobility tolerance and progress to stair training prior to potential discharge home.        If plan is discharge home, recommend the following: A little help with walking and/or transfers;A lot of help with bathing/dressing/bathroom;Assistance with cooking/housework;Assist for transportation;Help with stairs or ramp for entrance   Can travel by private vehicle        Equipment Recommendations Rolling walker (2 wheels);BSC/3in1  Recommendations for Other Services       Functional Status Assessment Patient has had a recent decline in their functional status and demonstrates the ability to make significant improvements in function in a reasonable and predictable amount of time.     Precautions / Restrictions Precautions Precautions: Fall Recall of Precautions/Restrictions: Intact Precaution/Restrictions Comments: direct anterior THA Restrictions Weight Bearing Restrictions Per Provider Order: Yes RLE Weight Bearing Per Provider Order: Weight bearing as tolerated      Mobility  Bed Mobility Overal bed mobility: Needs Assistance Bed Mobility: Supine to Sit     Supine to sit: Mod assist, HOB elevated, Used rails     General bed mobility comments: assist for RLE and trunk    Transfers Overall transfer level: Needs  assistance Equipment used: Rolling walker (2 wheels) Transfers: Sit to/from Stand Sit to Stand: Contact guard assist                Ambulation/Gait Ambulation/Gait assistance: Contact guard assist Gait Distance (Feet): 10 Feet Assistive device: Rolling walker (2 wheels) Gait Pattern/deviations: Step-to pattern Gait velocity: reduced Gait velocity interpretation: <1.31 ft/sec, indicative of household ambulator   General Gait Details: slowed step-to gait, reduced stance time on RLE  Stairs            Wheelchair Mobility     Tilt Bed    Modified Rankin (Stroke Patients Only)       Balance Overall balance assessment: Needs assistance Sitting-balance support: No upper extremity supported, Feet supported Sitting balance-Leahy Scale: Good     Standing balance support: Bilateral upper extremity supported, Reliant on assistive device for balance Standing balance-Leahy Scale: Poor                               Pertinent Vitals/Pain Pain Assessment Pain Assessment: 0-10 Pain Score: 5  Pain Location: R hip Pain Descriptors / Indicators: Burning Pain Intervention(s): Monitored during session    Home Living Family/patient expects to be discharged to:: Private residence Living Arrangements: Spouse/significant other Available Help at Discharge: Family;Available 24 hours/day Type of Home: House Home Access: Stairs to enter Entrance Stairs-Rails: Can reach both Entrance Stairs-Number of Steps: 6   Home Layout: One level Home Equipment: Cane - single point      Prior Function Prior Level of Function : Independent/Modified Independent;Working/employed;Driving             Mobility Comments:  ambulating for limited community distances. PRN use of [facility]       Extremity/Trunk Assessment   Upper Extremity Assessment Upper Extremity Assessment: Overall WFL for tasks assessed    Lower Extremity Assessment Lower Extremity Assessment: RLE  deficits/detail RLE Deficits / Details: generalized post-op weakness    Cervical / Trunk Assessment Cervical / Trunk Assessment: Normal  Communication   Communication Communication: No apparent difficulties    Cognition Arousal: Alert Behavior During Therapy: WFL for tasks assessed/performed   PT - Cognitive impairments: No apparent impairments                         Following commands: Intact       Cueing Cueing Techniques: Verbal cues     General Comments General comments (skin integrity, edema, etc.): VSS on RA    Exercises Other Exercises Other Exercises: PT provides education on surgical hip exercise packet   Assessment/Plan    PT Assessment Patient needs continued PT services  PT Problem List Decreased strength;Decreased activity tolerance;Decreased balance;Decreased mobility;Pain       PT Treatment Interventions DME instruction;Gait training;Functional mobility training;Stair training;Therapeutic activities;Therapeutic exercise;Balance training;Neuromuscular re-education;Patient/family education    PT Goals (Current goals can be found in the Care Plan section)  Acute Rehab PT Goals Patient Stated Goal: to return to independence PT Goal Formulation: With patient Time For Goal Achievement: 02/24/24 Potential to Achieve Goals: Fair    Frequency 7X/week     Co-evaluation               AM-PAC PT "6 Clicks" Mobility  Outcome Measure Help needed turning from your back to your side while in a flat bed without using bedrails?: A Little Help needed moving from lying on your back to sitting on the side of a flat bed without using bedrails?: A Lot Help needed moving to and from a bed to a chair (including a wheelchair)?: A Little Help needed standing up from a chair using your arms (e.g., wheelchair or bedside chair)?: A Little Help needed to walk in hospital room?: A Little Help needed climbing 3-5 steps with a railing? : Total 6 Click Score:  15    End of Session Equipment Utilized During Treatment: Gait belt Activity Tolerance: Patient tolerated treatment well Patient left: in chair;with call bell/phone within reach;with family/visitor present Nurse Communication: Mobility status PT Visit Diagnosis: Other abnormalities of gait and mobility (R26.89);Muscle weakness (generalized) (M62.81)    Time: 1610-9604 PT Time Calculation (min) (ACUTE ONLY): 34 min   Charges:   PT Evaluation $PT Eval Low Complexity: 1 Low   PT General Charges $$ ACUTE PT VISIT: 1 Visit         Arlyss Gandy, PT, DPT Acute Rehabilitation Office [phone]   Arlyss Gandy 02/20/2024, 9:33 AM

## 2024-02-20 NOTE — Progress Notes (Signed)
 Subjective: 1 Day Post-Op Procedure(s) (LRB): ARTHROPLASTY, HIP, TOTAL, ANTERIOR APPROACH (Right) Patient reports pain as moderate.    Objective: Vital signs in last 24 hours: Temp:  [97.4 F (36.3 C)-98.4 F (36.9 C)] 98.2 F (36.8 C) (04/02 0352) Pulse Rate:  [53-88] 88 (04/02 0352) Resp:  [12-18] 16 (04/02 0352) BP: (103-154)/(57-92) 140/75 (04/02 0352) SpO2:  [93 %-100 %] 95 % (04/02 0352) Weight:  [131.5 kg] 131.5 kg (04/01 0819)  Intake/Output from previous day: 04/01 0701 - 04/02 0700 In: 1596.7 [I.V.:1518.5; IV Piggyback:78.2] Out: 2050 [Urine:1750; Blood:300] Intake/Output this shift: No intake/output data recorded.  Recent Labs    02/20/24 0526  HGB 13.5   Recent Labs    02/20/24 0526  WBC 8.5  RBC 4.55  HCT 40.8  PLT 139*   Recent Labs    02/20/24 0526  NA 134*  K 4.3  CL 106  CO2 20*  BUN 25*  CREATININE 1.52*  GLUCOSE 138*  CALCIUM 8.4*   No results for input(s): "LABPT", "INR" in the last 72 hours.  Sensation intact distally Intact pulses distally Dorsiflexion/Plantar flexion intact Incision: scant drainage   Assessment/Plan: 1 Day Post-Op Procedure(s) (LRB): ARTHROPLASTY, HIP, TOTAL, ANTERIOR APPROACH (Right) Up with therapy      Kathryne Hitch 02/20/2024, 7:31 AM

## 2024-02-21 DIAGNOSIS — M1611 Unilateral primary osteoarthritis, right hip: Secondary | ICD-10-CM | POA: Diagnosis not present

## 2024-02-21 MED ORDER — ASPIRIN 81 MG PO CHEW: 81.0000 mg | CHEWABLE_TABLET | Freq: Two times a day (BID) | ORAL | 0 refills | Status: DC

## 2024-02-21 MED ORDER — OXYCODONE HCL 5 MG PO TABS: 5.0000 mg | ORAL_TABLET | Freq: Four times a day (QID) | ORAL | 0 refills | Status: DC | PRN

## 2024-02-21 MED ORDER — METHOCARBAMOL 500 MG PO TABS: 500.0000 mg | ORAL_TABLET | Freq: Four times a day (QID) | ORAL | 1 refills | Status: DC | PRN

## 2024-02-21 NOTE — Progress Notes (Signed)
 Patient being discharged via private transportation, discharge packet reviewed with family at bedside, iv access removed.

## 2024-02-21 NOTE — Discharge Summary (Signed)
 Patient ID: Aaron Clarke MRN: [id number] DOB/AGE: 66-30-1957 66 y.o.  Admit date: 02/19/2024 Discharge date: 02/21/2024  Admission Diagnoses:  Principal Problem:   Unilateral primary osteoarthritis, right hip Active Problems:   Status post total replacement of right hip   Discharge Diagnoses:  Same  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   Family history of breast cancer    mother   Hypertension    Osteoarthritis of right hip    PE (pulmonary thromboembolism) (HCC) 10/23/2022    Surgeries: Procedure(s): ARTHROPLASTY, HIP, TOTAL, ANTERIOR APPROACH on 02/19/2024   Consultants:   Discharged Condition: Improved  Hospital Course: Aaron Clarke is an 68 y.o. male who was admitted 02/19/2024 for operative treatment ofUnilateral primary osteoarthritis, right hip. Patient has severe unremitting pain that affects sleep, daily activities, and work/hobbies. After pre-op clearance the patient was taken to the operating room on 02/19/2024 and underwent  Procedure(s): ARTHROPLASTY, HIP, TOTAL, ANTERIOR APPROACH.    Patient was given perioperative antibiotics:  Anti-infectives (From admission, onward)    Start     Dose/Rate Route Frequency Ordered Stop   02/19/24 1630  ceFAZolin (ANCEF) IVPB 2g/100 mL premix        2 g 200 mL/hr over 30 Minutes Intravenous Every 6 hours 02/19/24 1524 02/20/24 1530   02/19/24 0815  ceFAZolin (ANCEF) IVPB 3g/150 mL premix        3 g 300 mL/hr over 30 Minutes Intravenous On call to O.R. 02/19/24 0810 02/19/24 1040        Patient was given sequential compression devices, early ambulation, and chemoprophylaxis to prevent DVT.  Patient benefited maximally from hospital stay and there were no complications.    Recent vital signs: Patient Vitals for the past 24 hrs:  BP Temp Temp src Pulse Resp SpO2  02/21/24 1509  124/68 98.3 F (36.8 C) Oral 70 17 100 %  02/21/24 0746 122/71 98.4 F (36.9 C) -- 75 18 94 %  02/21/24 0554 (!) 141/79 98.5 F (36.9 C) Oral 78 18 93 %  02/20/24 2057 124/60 99.1 F (37.3 C) Oral 76 17 94 %     Recent laboratory studies:  Recent Labs    02/20/24 0526  WBC 8.5  HGB 13.5  HCT 40.8  PLT 139*  NA 134*  K 4.3  CL 106  CO2 20*  BUN 25*  CREATININE 1.52*  GLUCOSE 138*  CALCIUM 8.4*     Discharge Medications:   Allergies as of 02/21/2024   No Known Allergies      Medication List     TAKE these medications    albuterol 108 (90 Base) MCG/ACT inhaler Commonly known as: VENTOLIN HFA Inhale 1-2 puffs into the lungs every 6 (six) hours as needed for wheezing or shortness of breath.   albuterol (2.5 MG/3ML) 0.083% nebulizer solution Commonly known as: PROVENTIL Take 3 mLs (2.5 mg total) by nebulization every 6 (six) hours as needed for wheezing or shortness of breath.   aspirin EC 81 MG tablet Take 81 mg by mouth daily. Swallow whole. What changed: Another medication with the same name was added. Make sure you understand how and when to take each.   aspirin 81 MG chewable tablet Chew 1 tablet (81 mg total) by mouth 2 (two) times daily. What changed: You were already taking a medication with the  same name, and this prescription was added. Make sure you understand how and when to take each.   bisoprolol 5 MG tablet Commonly known as: ZEBETA Take 1 tablet (5 mg total) by mouth daily.   budesonide-formoterol 160-4.5 MCG/ACT inhaler Commonly known as: Symbicort Take 2 puffs first thing in am and then another 2 puffs about 12 hours later.   celecoxib 200 MG capsule Commonly known as: CELEBREX TAKE 1 CAPSULE (200 MG TOTAL) BY MOUTH 2 (TWO) TIMES DAILY BETWEEN MEALS AS NEEDED.   methocarbamol 500 MG tablet Commonly known as: ROBAXIN Take 1 tablet (500 mg total) by mouth every 6 (six) hours as needed. What changed: Another medication with the same name  was added. Make sure you understand how and when to take each.   methocarbamol 500 MG tablet Commonly known as: ROBAXIN Take 1 tablet (500 mg total) by mouth every 6 (six) hours as needed for muscle spasms. What changed: You were already taking a medication with the same name, and this prescription was added. Make sure you understand how and when to take each.   oxyCODONE 5 MG immediate release tablet Commonly known as: Oxy IR/ROXICODONE Take 1-2 tablets (5-10 mg total) by mouth every 6 (six) hours as needed for moderate pain (pain score 4-6) (pain score 4-6).               Durable Medical Equipment  (From admission, onward)           Start     Ordered   02/19/24 1525  DME 3 n 1  Once        02/19/24 1524   02/19/24 1525  DME Walker rolling  Once       Question Answer Comment  Walker: With 5 Inch Wheels   Patient needs a walker to treat with the following condition Status post total replacement of right hip      02/19/24 1524            Diagnostic Studies: DG Pelvis Portable Result Date: 02/19/2024 CLINICAL DATA:  Right hip arthroplasty EXAM: PORTABLE PELVIS 1-2 VIEWS COMPARISON:  10/27/2023 FINDINGS: 2 frontal views of the pelvis including bilateral hips. Portions of the right greater trochanter are excluded by collimation. Right hip arthroplasty is identified in the expected position without evidence of acute complication. Stable mild left hip osteoarthritis. IMPRESSION: 1. Unremarkable right hip arthroplasty. Electronically Signed   By: Sharlet Salina M.D.   On: 02/19/2024 15:55   DG HIP UNILAT WITH PELVIS 1V RIGHT Result Date: 02/19/2024 CLINICAL DATA:  Right hip arthroplasty EXAM: DG HIP (WITH OR WITHOUT PELVIS) 1V RIGHT COMPARISON:  10/27/2023 FINDINGS: 5 fluoroscopic images are obtained during the performance of the procedure and are provided for interpretation only. Placement of a right hip arthroplasty is identified, in the expected position. Please refer to the  operative report. Fluoroscopy time: 21 seconds, 5.8 mGy IMPRESSION: 1. Right hip arthroplasty.  Please refer to the operative report. Electronically Signed   By: Sharlet Salina M.D.   On: 02/19/2024 15:54   DG C-Arm 1-60 Min-No Report Result Date: 02/19/2024 Fluoroscopy was utilized by the requesting physician.  No radiographic interpretation.    Disposition: Discharge disposition: 01-Home or Self Care          Follow-up Information     Kathryne Hitch, MD Follow up in 2 week(s).   Specialty: Orthopedic Surgery Contact information: [address] [place] [address] [phone]         Dimas Aguas,  Binnie Rail, FNP Follow up.   Specialty: Family Medicine Contact information: [address] [place] [address] [phone]         [facility] Follow up.   Specialty: Home Health Services Why: Home health services will be provided by [facility] Contact information: [address] [place] [address] [phone]                  Signed: Kathryne Hitch 02/21/2024, 4:32 PM

## 2024-02-21 NOTE — Progress Notes (Signed)
 Subjective: 2 Days Post-Op Procedure(s) (LRB): ARTHROPLASTY, HIP, TOTAL, ANTERIOR APPROACH (Right) Patient reports pain as moderate.  Just finished his morning PT session.  Objective: Vital signs in last 24 hours: Temp:  [98.4 F (36.9 C)-99.1 F (37.3 C)] 98.4 F (36.9 C) (04/03 0746) Pulse Rate:  [75-80] 75 (04/03 0746) Resp:  [16-18] 18 (04/03 0746) BP: (122-141)/(60-79) 122/71 (04/03 0746) SpO2:  [93 %-94 %] 94 % (04/03 0746)  Intake/Output from previous day: 04/02 0701 - 04/03 0700 In: 240 [P.O.:240] Out: 650 [Urine:650] Intake/Output this shift: No intake/output data recorded.  Recent Labs    02/20/24 0526  HGB 13.5   Recent Labs    02/20/24 0526  WBC 8.5  RBC 4.55  HCT 40.8  PLT 139*   Recent Labs    02/20/24 0526  NA 134*  K 4.3  CL 106  CO2 20*  BUN 25*  CREATININE 1.52*  GLUCOSE 138*  CALCIUM 8.4*   No results for input(s): "LABPT", "INR" in the last 72 hours.  Sensation intact distally Intact pulses distally Dorsiflexion/Plantar flexion intact Incision: dressing C/D/I   Assessment/Plan: 2 Days Post-Op Procedure(s) (LRB): ARTHROPLASTY, HIP, TOTAL, ANTERIOR APPROACH (Right) Up with therapy Discharge home with home health this afternoon.      Aaron Clarke 02/21/2024, 9:40 AM

## 2024-02-21 NOTE — TOC Transition Note (Addendum)
 Transition of Care [facility]) - Discharge Note   Patient Details  Name: Aaron Clarke MRN: [id number] Date of Birth: Apr 04, 1956  [facility] CM/SW Contact:  Epifanio Lesches, RN Phone Number: 02/21/2024, 9:58 AM   Clinical Narrative:    Patient will DC to: home Anticipated DC date: 02/21/2024 Family notified: yes Transport by: car      - s/p  R THA 4/1 Per MD patient ready for DC this afternoon. RN, patient, patient's wife, and [facility]  notified of DC.  Post hospital f/u noted on AVS. Pt without RX med concerns. Equipment will be delivered to bedside prior to d/c.  Wife to provide transportation to home.  RNCM will sign off for now as intervention is no longer needed. Please consult [place] again if new needs arise.    Final next level of care: Home w Home Health Services Barriers to Discharge: No Barriers Identified   Patient Goals and CMS Choice     Choice offered to / list presented to : Patient      Discharge Placement                       Discharge Plan and Services Additional resources added to the After Visit Summary for                  DME Arranged: Bedside commode, Walker rolling DME Agency: [employer] Date DME Agency Contacted: 02/20/24 Time DME Agency Contacted: [phone] Representative spoke with at DME Agency: April HH Arranged: PT [facility] Agency: [facility]        Social Drivers of Health (SDOH) Interventions SDOH Screenings   Food Insecurity: No Food Insecurity (02/19/2024)  Housing: Low Risk  (02/19/2024)  Transportation Needs: No Transportation Needs (02/19/2024)  Utilities: Not At Risk (02/19/2024)  Alcohol Screen: Low Risk  (01/29/2024)  Depression (PHQ2-9): Low Risk  (01/29/2024)  Financial Resource Strain: Low Risk  (01/29/2024)  Physical Activity: Inactive (01/29/2024)  Social Connections: Moderately Isolated (02/19/2024)  Stress: No Stress Concern Present (01/29/2024)  Tobacco Use: Medium Risk  (02/19/2024)  Health Literacy: Adequate Health Literacy (01/29/2024)     Readmission Risk Interventions     No data to display

## 2024-02-21 NOTE — Progress Notes (Signed)
 Physical Therapy Treatment Patient Details Name: Aaron Clarke MRN: [id number] DOB: December 09, 1955 Today's Date: 02/21/2024   History of Present Illness 68 y.o. male presents to [facility] hospital on 02/19/2024 for elective R THA. PMH includes Asthma, CKD, DVT, HTN, PE.    PT Comments  The pt continues to make good progress with mobility this morning. He was able to complete 4 stairs with minA and use of R rail using sideways step-to pattern. Both pt and his wife report good confidence in pt safety and ability to complete the 6 stairs to enter his home. Pt then elected to walk back twards his room, completed 45 ft with RW and cues for increased R toe clearance and proximity to RW, pt continues have weakness in R hip that limits stride length, hip flexion, and ability to power up to standing. Discussed use of chairs to provide seated rest before and after stairs for maximal pain management and safety. Pt also encouraged to complete seated exercises throughout the day and progressive walking program and HEP once returned home. Will return for additional session geared towards final education, HEP training, and delivery of gait belt for pt/family use this afternoon.    If plan is discharge home, recommend the following: A little help with walking and/or transfers;A lot of help with bathing/dressing/bathroom;Assistance with cooking/housework;Assist for transportation;Help with stairs or ramp for entrance   Can travel by private vehicle        Equipment Recommendations  Rolling walker (2 wheels);BSC/3in1    Recommendations for Other Services       Precautions / Restrictions Precautions Precautions: Fall Recall of Precautions/Restrictions: Intact Precaution/Restrictions Comments: direct anterior THA Restrictions Weight Bearing Restrictions Per Provider Order: Yes RLE Weight Bearing Per Provider Order: Weight bearing as tolerated     Mobility  Bed Mobility               General bed mobility  comments: pt OOB in recliner at start and end of session    Transfers Overall transfer level: Needs assistance Equipment used: Rolling walker (2 wheels) Transfers: Sit to/from Stand Sit to Stand: Min assist           General transfer comment: minA from recliner with cues for hand positioning. pt prefers to pull on RW, encouraged pushing from recliner armrests    Ambulation/Gait Ambulation/Gait assistance: Contact guard assist, Supervision Gait Distance (Feet): 45 Feet Assistive device: Rolling walker (2 wheels) Gait Pattern/deviations: Step-to pattern, Decreased dorsiflexion - right, Decreased step length - right, Decreased weight shift to right Gait velocity: reduced Gait velocity interpretation: <1.31 ft/sec, indicative of household ambulator   General Gait Details: cues for R toe DF with step for increased clearance, no overt buckling or LOB. pt limited by soreness in bilateral shoulders       Balance Overall balance assessment: Needs assistance Sitting-balance support: No upper extremity supported, Feet supported Sitting balance-Leahy Scale: Good     Standing balance support: Bilateral upper extremity supported, Reliant on assistive device for balance Standing balance-Leahy Scale: Poor Standing balance comment: dependent on BUE support                            Communication Communication Communication: No apparent difficulties  Cognition Arousal: Alert Behavior During Therapy: WFL for tasks assessed/performed   PT - Cognitive impairments: No apparent impairments  Following commands: Intact      Cueing Cueing Techniques: Verbal cues  Exercises Total Joint Exercises Ankle Circles/Pumps: AROM, Both, 10 reps Long Arc Quad: AROM, Both, 5 reps, Seated General Exercises - Lower Extremity Hip Flexion/Marching: AROM, Right, 5 reps, Seated Heel Raises: AROM, Both, 15 reps, Seated    General Comments General comments  (skin integrity, edema, etc.): VSS on RA      Pertinent Vitals/Pain Pain Assessment Pain Assessment: Faces Faces Pain Scale: Hurts little more Pain Location: R hip Pain Descriptors / Indicators: Sore Pain Intervention(s): Limited activity within patient's tolerance, Monitored during session, Repositioned, Ice applied     PT Goals (current goals can now be found in the care plan section) Acute Rehab PT Goals Patient Stated Goal: to return to independence PT Goal Formulation: With patient Time For Goal Achievement: 02/24/24 Potential to Achieve Goals: Good Progress towards PT goals: Progressing toward goals    Frequency    7X/week       AM-PAC PT "6 Clicks" Mobility   Outcome Measure  Help needed turning from your back to your side while in a flat bed without using bedrails?: A Little Help needed moving from lying on your back to sitting on the side of a flat bed without using bedrails?: A Lot Help needed moving to and from a bed to a chair (including a wheelchair)?: A Little Help needed standing up from a chair using your arms (e.g., wheelchair or bedside chair)?: A Little Help needed to walk in hospital room?: A Little Help needed climbing 3-5 steps with a railing? : A Little 6 Click Score: 17    End of Session Equipment Utilized During Treatment: Gait belt Activity Tolerance: Patient tolerated treatment well Patient left: in chair;with call bell/phone within reach;with family/visitor present Nurse Communication: Mobility status PT Visit Diagnosis: Other abnormalities of gait and mobility (R26.89);Muscle weakness (generalized) (M62.81)     Time: 4098-1191 PT Time Calculation (min) (ACUTE ONLY): 38 min  Charges:    $Gait Training: 8-22 mins $Therapeutic Exercise: 8-22 mins $Therapeutic Activity: 8-22 mins PT General Charges $$ ACUTE PT VISIT: 1 Visit                     Vickki Muff, PT, DPT   Acute Rehabilitation Department Office [phone] Secure  Chat Communication Preferred   Ronnie Derby 02/21/2024, 9:46 AM

## 2024-02-21 NOTE — Progress Notes (Signed)
 Physical Therapy Treatment Patient Details Name: Aaron Clarke MRN: [id number] DOB: [birth year] Today's Date: 02/21/2024   History of Present Illness 68 y.o. male presents to [facility] hospital on 02/19/2024 for elective R THA. PMH includes Asthma, CKD, DVT, HTN, PE.    PT Comments  Pt seen for final education and mobility training. Making great progress with gait as he was able to progress to some step-through gait pattern without needing cues for R toes DF with each step. Pt reports improved pain, able to complete 75 ft ambulation with use of RW. All questions on HEP and mobility at home answered. Pt given personal gait belt for family use on stairs. No further acute PT needs at this time.     If plan is discharge home, recommend the following: A little help with walking and/or transfers;A lot of help with bathing/dressing/bathroom;Assistance with cooking/housework;Assist for transportation;Help with stairs or ramp for entrance   Can travel by private vehicle        Equipment Recommendations  Rolling walker (2 wheels);BSC/3in1    Recommendations for Other Services       Precautions / Restrictions Precautions Precautions: Fall Recall of Precautions/Restrictions: Intact Precaution/Restrictions Comments: direct anterior THA Restrictions Weight Bearing Restrictions Per Provider Order: Yes RLE Weight Bearing Per Provider Order: Weight bearing as tolerated     Mobility  Bed Mobility Overal bed mobility: Needs Assistance Bed Mobility: Supine to Sit     Supine to sit: Min assist     General bed mobility comments: cues to complete roll and pt then came to long sitting to scoot to EOB, minA to elevate trunk and manage RLE    Transfers Overall transfer level: Needs assistance Equipment used: Rolling walker (2 wheels) Transfers: Sit to/from Stand Sit to Stand: Min assist, Contact guard assist           General transfer comment: minA to steady RW, CGA from elevated surface with  cues for posture    Ambulation/Gait Ambulation/Gait assistance: Contact guard assist, Supervision Gait Distance (Feet): 75 Feet Assistive device: Rolling walker (2 wheels) Gait Pattern/deviations: Step-to pattern, Decreased dorsiflexion - right, Decreased step length - right, Decreased weight shift to right, Step-through pattern Gait velocity: reduced Gait velocity interpretation: <1.31 ft/sec, indicative of household ambulator   General Gait Details: at times step through pattern with improved clearance on RLE.   Stairs Stairs: Yes Stairs assistance: Min assist Stair Management: One rail Right, Step to pattern, Sideways Number of Stairs: 4     Wheelchair Mobility     Tilt Bed    Modified Rankin (Stroke Patients Only)       Balance Overall balance assessment: Needs assistance Sitting-balance support: No upper extremity supported, Feet supported Sitting balance-Leahy Scale: Good     Standing balance support: Bilateral upper extremity supported, Reliant on assistive device for balance Standing balance-Leahy Scale: Poor Standing balance comment: dependent on BUE support                            Communication Communication Communication: No apparent difficulties  Cognition Arousal: Alert Behavior During Therapy: WFL for tasks assessed/performed   PT - Cognitive impairments: No apparent impairments                         Following commands: Intact      Cueing Cueing Techniques: Verbal cues  Exercises Total Joint Exercises Ankle Circles/Pumps: AROM, Both, 10 reps Long Arc Quad: AROM,  Both, 5 reps, Seated General Exercises - Lower Extremity Hip Flexion/Marching: AROM, Right, 5 reps, Seated Heel Raises: AROM, Both, 15 reps, Seated    General Comments General comments (skin integrity, edema, etc.): VSS      Pertinent Vitals/Pain Pain Assessment Pain Assessment: Faces Faces Pain Scale: Hurts little more Pain Location: R hip Pain  Descriptors / Indicators: Sore Pain Intervention(s): Limited activity within patient's tolerance, Monitored during session, Patient requesting pain meds-RN notified, RN gave pain meds during session, Ice applied    Home Living                          Prior Function            PT Goals (current goals can now be found in the care plan section) Acute Rehab PT Goals Patient Stated Goal: to return to independence PT Goal Formulation: With patient Time For Goal Achievement: 02/24/24 Potential to Achieve Goals: Good Progress towards PT goals: Progressing toward goals    Frequency    7X/week      PT Plan      Co-evaluation              AM-PAC PT "6 Clicks" Mobility   Outcome Measure  Help needed turning from your back to your side while in a flat bed without using bedrails?: A Little Help needed moving from lying on your back to sitting on the side of a flat bed without using bedrails?: A Lot Help needed moving to and from a bed to a chair (including a wheelchair)?: A Little Help needed standing up from a chair using your arms (e.g., wheelchair or bedside chair)?: A Little Help needed to walk in hospital room?: A Little Help needed climbing 3-5 steps with a railing? : A Little 6 Click Score: 17    End of Session Equipment Utilized During Treatment: Gait belt Activity Tolerance: Patient tolerated treatment well Patient left: in chair;with call bell/phone within reach Nurse Communication: Mobility status (ready for d/c) PT Visit Diagnosis: Other abnormalities of gait and mobility (R26.89);Muscle weakness (generalized) (M62.81)     Time: 8119-1478 PT Time Calculation (min) (ACUTE ONLY): 24 min  Charges:    $Gait Training: 8-22 mins $Therapeutic Activity: 8-22 mins PT General Charges $$ ACUTE PT VISIT: 1 Visit                     Vickki Muff, PT, DPT   Acute Rehabilitation Department Office [phone] Secure Chat Communication  Preferred   Ronnie Derby 02/21/2024, 4:23 PM

## 2024-02-23 IMAGING — DX DG CHEST 2V
2 series · 2 of 2 positions shown · non-contrast
Comparison: 07/06/2016

CLINICAL DATA: Cough and nasal congestion

EXAM:
CHEST - 2 VIEW

[chest pa]
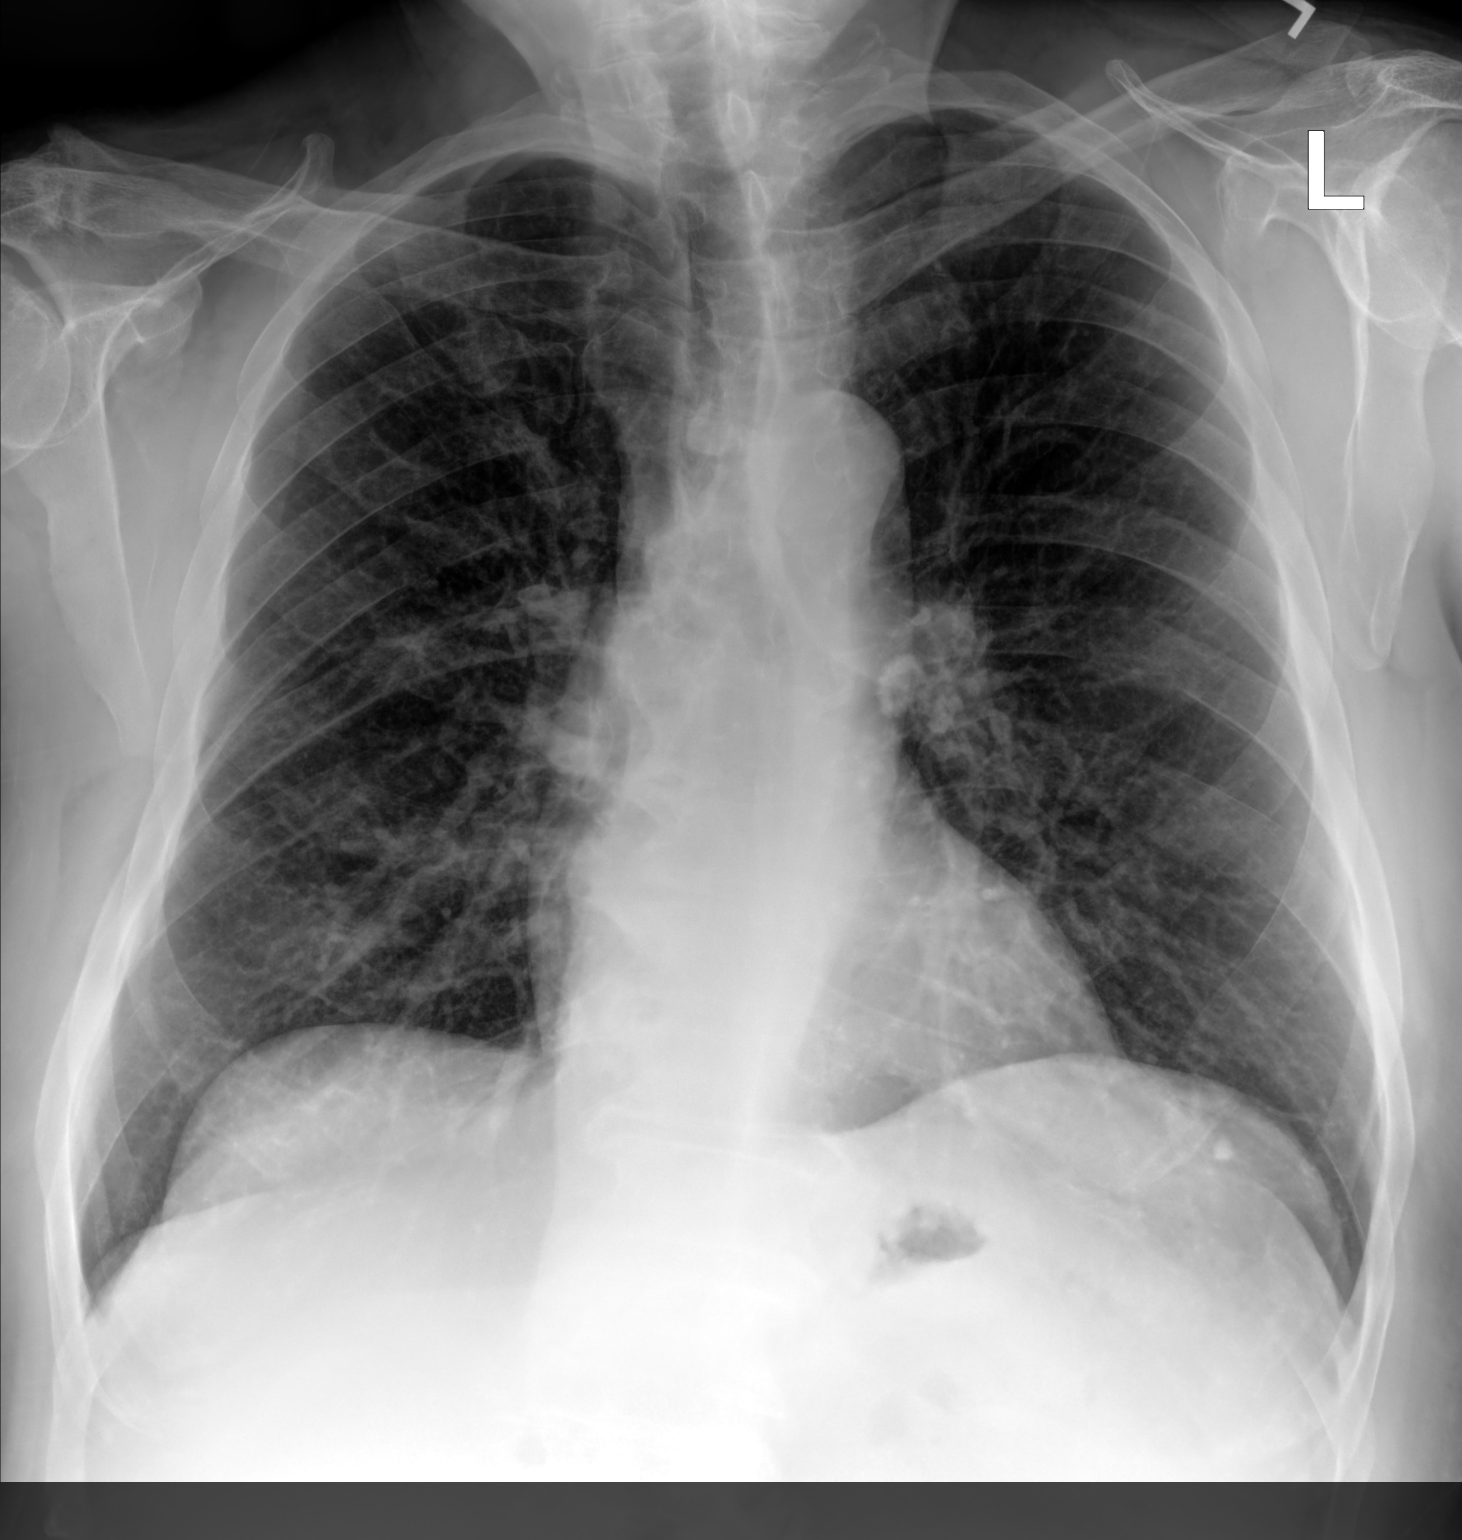

[chest lat]
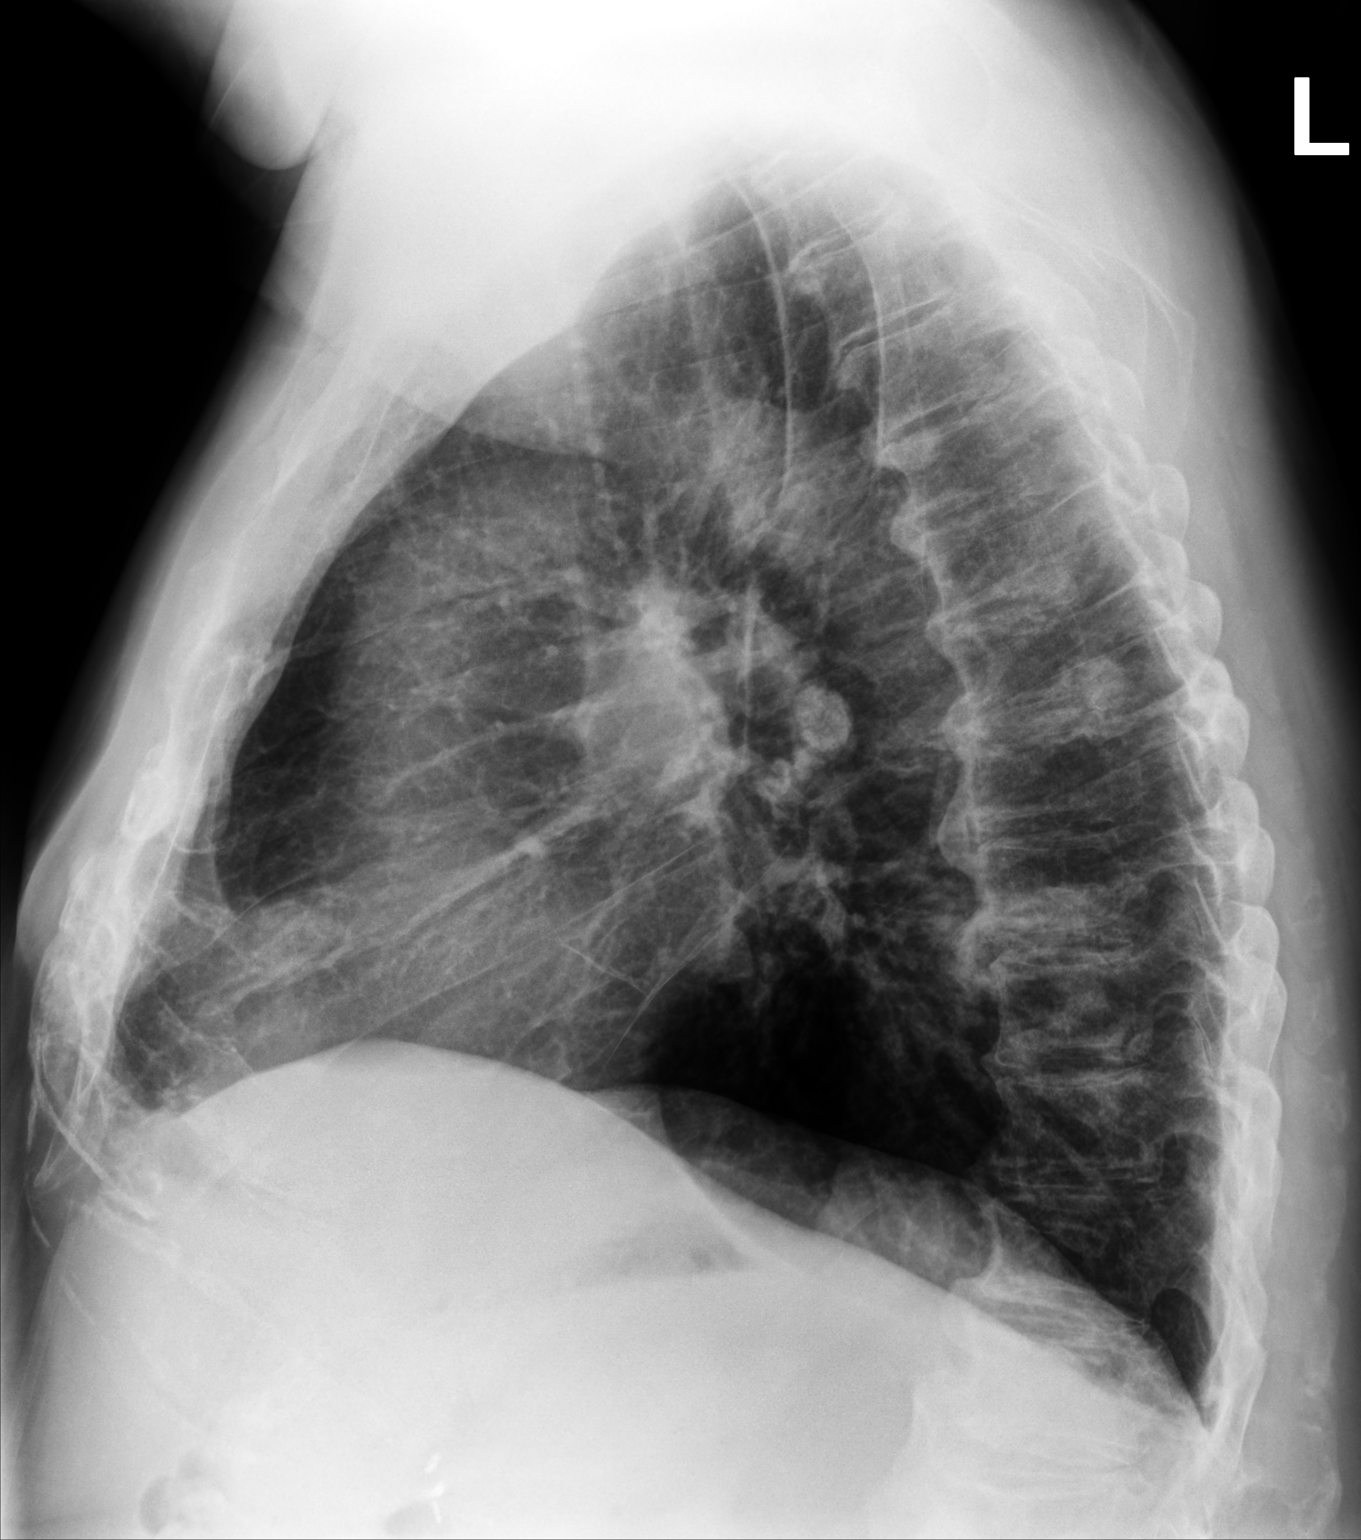

[2 of 2 positions shown; findings below may reference images not displayed]

FINDINGS: Mild hyperinflation. The heart size and mediastinal contours are
within normal limits. No focal pulmonary opacity. No acute osseous
abnormality.
IMPRESSION: No acute cardiopulmonary process.

## 2024-02-24 NOTE — Progress Notes (Unsigned)
 Aaron Clarke, male    DOB: 08-07-1956    MRN: [id number]   Brief patient profile:  47  yowm  quit smoking 1989 told had asthma as child  referred to pulmonary clinic in [place]  01/02/2024 by EDP for eval of doe onset sev  years before covid previously treated with as needed saba but much worse since Jan 2025 / needs clearance for hip surgery by Dr Dorris Fetch into new house/ new construction [place]  [place]   Seen in ER 1st time one month p moved into new house   Has h/o PE   [place] > stopped p 4 month rx   History of Present Illness  01/02/2024  Pulmonary/ 1st office eval/ Aaron Clarke / [facility]  Chief Complaint  Patient presents with   Consult   Shortness of Breath   Dyspnea:  walking thru [employer] no problem slow pace /also limited by R hip pain  Cough: white mucus, assoc with  nasal congestion  Sleep: flat bed 2 pillows does ok hs but hears wheezing when supine x years  SABA use: 2 x daily hfa/ neb once a day 02: none  LDSCT:n/a Rec Plan A = Automatic = Always=    Symbicort 160 Take 2 puffs first thing in am and then another 2 puffs about 12 hours later.  Work on inhaler technique:   Plan B = Backup (to supplement plan A, not to replace it) Only use your albuterol inhaler as a rescue medication Plan C = Crisis (instead of Plan B but only if Plan B stops working) - only use your albuterol nebulizer if you first try Plan B Stop valsartan for now  Start bisoprolol 5 mg daily in its place  due to pulse 118 at rest  Prednisone 10 mg take  4 each am x 2 days,   2 each am x 2 days,  1 each am x 2 days and stop  Please schedule a follow up office visit in 2 weeks, sooner if needed  with all medications /inhalers/ solutions in hand    -  Allergy screen 01/02/2024 >  Eos 0.3   IgE  183 -  Alpha one AT phenotype 01/02/2024 >>>  MM  173   01/16/2024  f/u ov/[place] office/Aaron Clarke re: AB maint on symbicort  did not bring meds  Chief Complaint  Patient  presents with   Follow-up    2 week  follow up    Dyspnea:  slowed by hip not sob Cough: no problem  Sleeping: no longer wheeze/ lying flat ok s resp cc  SABA use: none  02: none  Lung cancer screening: see CTa 12/27/23 no nodules  Reduce bisoprolol to one half tablet each am  Restart valsartan- hct one pill daily - call me if blood pressure not satisfactory  Work on inhaler technique:  You are cleared for R hip surgery  Please schedule a follow up office visit in 6 weeks, call sooner if needed with all medications /inhalers/ solutions in hand    02/27/2024  f/u ov/[place] office/Aaron Clarke re: AB  maint on symbicort 160  did not bring resp rx  Chief Complaint  Patient presents with   Asthma  Dyspnea:  up and down steps/ 2 wheeled walker  Cough: none  Sleeping: loud snoring / daytime hypersomnolence with periods of apnea per fiancee  SABA use: none  02: non    No obvious day to day or daytime  variability or assoc excess/ purulent sputum or mucus plugs or hemoptysis or cp or chest tightness, subjective wheeze or overt sinus or hb symptoms.    Also denies any obvious fluctuation of symptoms with weather or environmental changes or other aggravating or alleviating factors except as outlined above   No unusual exposure hx or h/o childhood pna or knowledge of premature birth.  Current Allergies, Complete Past Medical History, Past Surgical History, Family History, and Social History were reviewed in [employer] record.  ROS  The following are not active complaints unless bolded Hoarseness, sore throat, dysphagia, dental problems, itching, sneezing,  nasal congestion or discharge of excess mucus or purulent secretions, ear ache,   fever, chills, sweats, unintended wt loss or wt gain, classically pleuritic or exertional cp,  orthopnea pnd or arm/hand swelling  or leg swelling, presyncope, palpitations, abdominal pain, anorexia, nausea, vomiting, diarrhea  or change in  bowel habits or change in bladder habits, change in stools or change in urine, dysuria, hematuria,  rash, arthralgias, visual complaints, headache, numbness, weakness or ataxia or problems with walking or coordination,  change in mood or  memory.        Current Meds  Medication Sig   albuterol (PROVENTIL) (2.5 MG/3ML) 0.083% nebulizer solution Take 3 mLs (2.5 mg total) by nebulization every 6 (six) hours as needed for wheezing or shortness of breath.   albuterol (VENTOLIN HFA) 108 (90 Base) MCG/ACT inhaler Inhale 1-2 puffs into the lungs every 6 (six) hours as needed for wheezing or shortness of breath.   aspirin 81 MG chewable tablet Chew 1 tablet (81 mg total) by mouth 2 (two) times daily.   aspirin EC 81 MG tablet Take 81 mg by mouth daily. Swallow whole.   bisoprolol (ZEBETA) 5 MG tablet Take 1 tablet (5 mg total) by mouth daily.   budesonide-formoterol (SYMBICORT) 160-4.5 MCG/ACT inhaler Take 2 puffs first thing in am and then another 2 puffs about 12 hours later.   celecoxib (CELEBREX) 200 MG capsule TAKE 1 CAPSULE (200 MG TOTAL) BY MOUTH 2 (TWO) TIMES DAILY BETWEEN MEALS AS NEEDED.   methocarbamol (ROBAXIN) 500 MG tablet Take 1 tablet (500 mg total) by mouth every 6 (six) hours as needed.   methocarbamol (ROBAXIN) 500 MG tablet Take 1 tablet (500 mg total) by mouth every 6 (six) hours as needed for muscle spasms.   oxyCODONE (OXY IR/ROXICODONE) 5 MG immediate release tablet Take 1-2 tablets (5-10 mg total) by mouth every 6 (six) hours as needed for moderate pain (pain score 4-6) (pain score 4-6).           Past Medical History:  Diagnosis Date   Asthma    Cholelithiases    Chronic kidney disease    Kidney stone 04/2011   Family history of breast cancer    mother      Objective:    Wts  02/27/2024              295   01/16/24 (!) 300 lb 12.8 oz (136.4 kg)  01/02/24 291 lb (132 kg)  12/31/23 295 lb (133.8 kg)    Vital signs reviewed  02/27/2024  - Note at rest 02 sats  93% on  RA   General appearance:    w/c bound  MO (by bmi) elderly wm nad     HEENT : Oropharynx  clear      NECK :  without  apparent JVD/ palpable Nodes/TM    LUNGS: no acc muscle  use,  Min barrel  contour chest wall with bilateral  slightly decreased bs s audible wheeze and  without cough on insp or exp maneuvers and min  Hyperresonant  to  percussion bilaterally    CV:  RRR  no s3 or murmur or increase in P2, and no pitting edema   ABD:  quite obese soft and nontender    MS:  Nl gait/ ext warm without deformities Or obvious joint restrictions  calf tenderness, cyanosis or clubbing     SKIN: warm and dry without lesions    NEURO:  alert, approp, nl sensorium with  no motor or cerebellar deficits apparent.                 Assessment

## 2024-02-27 ENCOUNTER — Ambulatory Visit: Payer: Medicare Other | Admitting: Internal Medicine

## 2024-02-27 ENCOUNTER — Encounter: Payer: Self-pay | Admitting: Internal Medicine

## 2024-02-27 VITALS — BP 125/81 | HR 71 | Ht 72.0 in | Wt 295.0 lb

## 2024-02-27 DIAGNOSIS — G4733 Obstructive sleep apnea (adult) (pediatric): Secondary | ICD-10-CM | POA: Diagnosis not present

## 2024-02-27 DIAGNOSIS — J4489 Other specified chronic obstructive pulmonary disease: Secondary | ICD-10-CM | POA: Diagnosis not present

## 2024-02-27 MED ORDER — BUDESONIDE-FORMOTEROL FUMARATE 160-4.5 MCG/ACT IN AERO: INHALATION_SPRAY | RESPIRATORY_TRACT | 12 refills | Status: DC

## 2024-02-27 NOTE — Patient Instructions (Signed)
 Continue symbicort 160 Take 2 puffs first thing in am and then another 2 puffs about 12 hours later.    Only use your albuterol as a rescue medication to be used if you can't catch your breath by resting or doing a relaxed purse lip breathing pattern.  - The less you use it, the better it will work when you need it. - Ok to use up to 2 puffs  every 4 hours if you must but call for immediate appointment if use goes up over your usual need - Don't leave home without it !!  (think of it like the spare tire for your car)   My office will be contacting you by phone for referral to home sleep study  - if you don't hear back from my office within one week please call us back or notify us thru MyChart and we'll address it right away.   Please schedule a follow up visit in 3 months but call sooner if needed

## 2024-02-27 NOTE — Assessment & Plan Note (Signed)
 Fiancee reported loud snoring / apnea/ daytime hypersomnolence 02/27/2024  - HST 02/27/2024 >>>          Each maintenance medication was reviewed in detail including emphasizing most importantly the difference between maintenance and prns and under what circumstances the prns are to be triggered using an action plan format where appropriate.  Total time for H and P, chart review, counseling, reviewing hfa device(s) and generating customized AVS unique to this office visit / same day charting = 23 min

## 2024-02-27 NOTE — Assessment & Plan Note (Signed)
 Onset Sept 2024 s/p smoking cessation 1989/MM  - 01/02/2024  After extensive coaching inhaler device,  effectiveness =   60% (short Ti)  - Allergy screen 01/02/2024 >  Eos 0.3   IgE  183 -  Alpha one AT phenotype 01/02/2024 >>>  MM  173  - 01/16/2024  After extensive coaching inhaler device,  effectiveness =    80%with hfa > continue symbicort 160 2bid  - 02/27/2024 changed to symbicort generic or breyna 160 2bid   All goals of chronic asthma control met including optimal function and elimination of symptoms with minimal need for rescue therapy.  Contingencies discussed in full including contacting this office immediately if not controlling the symptoms using the rule of two's.     Main issue is cost of meds so try to replace symbicort 160 with generic or breyna 160

## 2024-03-03 ENCOUNTER — Ambulatory Visit (INDEPENDENT_AMBULATORY_CARE_PROVIDER_SITE_OTHER): Payer: Medicare Other | Admitting: Orthopaedic Surgery

## 2024-03-03 ENCOUNTER — Encounter: Payer: Self-pay | Admitting: Orthopaedic Surgery

## 2024-03-03 DIAGNOSIS — Z96641 Presence of right artificial hip joint: Secondary | ICD-10-CM

## 2024-03-03 MED ORDER — OXYCODONE HCL 5 MG PO TABS
5.0000 mg | ORAL_TABLET | Freq: Four times a day (QID) | ORAL | 0 refills | Status: DC | PRN
Start: 2024-03-03 — End: 2024-06-03

## 2024-03-03 NOTE — Progress Notes (Signed)
 The patient is a 68 year old who is now 2 weeks status post a right total hip replacement.  He is doing well overall.  He is ambulate with a walker and is ready to transition to a cane.  He does need a refill of oxycodone but he is using this sparingly.  He knows he can drive when he is off these narcotics.  He has been compliant with a baby aspirin twice daily.  He denies any calf pain.  He was on a baby aspirin once daily prior to surgery.  His right operative hip incision looks good.  Staples have been removed and Steri-Strips applied.  He tolerates this gently putting us  at the range of motion.  From my standpoint we will see him back in a month to see how he is doing overall but no x-rays are needed.  He was slowly mobilized and transition from a walker and then to a cane to hopefully nothing over the next 4 weeks depending on how he is doing.  I will send in some more oxycodone for him.

## 2024-03-04 ENCOUNTER — Encounter: Payer: Self-pay | Admitting: Family Medicine

## 2024-03-04 ENCOUNTER — Ambulatory Visit: Admitting: Family Medicine

## 2024-03-04 VITALS — BP 128/78 | HR 75 | Resp 18 | Ht 72.0 in | Wt 292.1 lb

## 2024-03-04 DIAGNOSIS — Z96641 Presence of right artificial hip joint: Secondary | ICD-10-CM

## 2024-03-04 NOTE — Progress Notes (Signed)
 Subjective:  HPI: Aaron Clarke is a 68 y.o. male presenting on 03/04/2024 for Hospitalization Follow-up (Pt was was admitted on 04/01 for arthritis pain in right hip. Pt underwent a arthroplasty of the right hip. Pt states he is recovering well with the help of PT )   HPI Patient is in today for hospital follow up. Aaron Clarke was hospitalized from 02/19/2024 to 02/21/2024 for right hip replacement. He has completed physical therapy and was released early due to making great progress. He transitioned from walker to cane several days ago. He does have exercises to continue at home. Pain is well controlled, reported as 2-3 sometimes. Has Oxycodone that he is using every other day. He is taking ASA daily. Denies falls, calf pain, swelling, redness, incision redness or drainage, fever or chills. Planned for follow up with ortho in a month and should transition to ambulating without a cane by then.   Review of Systems  All other systems reviewed and are negative.   Relevant past medical history reviewed and updated as indicated.   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   Family history of breast cancer    mother   Hypertension    Osteoarthritis of right hip    PE (pulmonary thromboembolism) (HCC) 10/23/2022     Past Surgical History:  Procedure Laterality Date   CHOLECYSTECTOMY  12/19/2011   Procedure: LAPAROSCOPIC CHOLECYSTECTOMY WITH INTRAOPERATIVE CHOLANGIOGRAM;  Surgeon: Ernestene Mention, MD;  Location: [facility] OR;  Service: General;  Laterality: N/A;   COLONOSCOPY WITH PROPOFOL N/A 09/13/2022   Procedure: COLONOSCOPY WITH PROPOFOL;  Surgeon: Toney Reil, MD;  Location: [facility] ENDOSCOPY;  Service: Gastroenterology;  Laterality: N/A;   Colonscopy     Fatty tumor  2003   L chest   TOTAL HIP ARTHROPLASTY Right 02/19/2024   Procedure: ARTHROPLASTY,  HIP, TOTAL, ANTERIOR APPROACH;  Surgeon: Kathryne Hitch, MD;  Location: [facility] OR;  Service: Orthopedics;  Laterality: Right;    Allergies and medications reviewed and updated.   Current Outpatient Medications:    albuterol (VENTOLIN HFA) 108 (90 Base) MCG/ACT inhaler, Inhale 1-2 puffs into the lungs every 6 (six) hours as needed for wheezing or shortness of breath., Disp: 8 g, Rfl: 0   aspirin 81 MG chewable tablet, Chew 1 tablet (81 mg total) by mouth 2 (two) times daily., Disp: 30 tablet, Rfl: 0   aspirin EC 81 MG tablet, Take 81 mg by mouth daily. Swallow whole., Disp: , Rfl:    bisoprolol (ZEBETA) 5 MG tablet, Take 1 tablet (5 mg total) by mouth daily., Disp: 30 tablet, Rfl: 11   budesonide-formoterol (BREYNA) 160-4.5 MCG/ACT inhaler, Take 2 puffs first thing in am and then another 2 puffs about 12 hours later., Disp: 1 each, Rfl: 12   budesonide-formoterol (SYMBICORT) 160-4.5 MCG/ACT inhaler, Take 2 puffs first thing in am and then another 2 puffs about 12 hours later., Disp: 1 each, Rfl: 11   celecoxib (CELEBREX) 200 MG capsule, TAKE 1 CAPSULE (200 MG TOTAL) BY MOUTH 2 (TWO) TIMES DAILY BETWEEN MEALS AS NEEDED., Disp: 60 capsule, Rfl: 1   methocarbamol (ROBAXIN) 500 MG tablet, Take 1 tablet (500 mg total) by mouth every 6 (six) hours as needed., Disp: 40 tablet, Rfl: 1   methocarbamol (ROBAXIN) 500 MG tablet, Take 1 tablet (500 mg total)  by mouth every 6 (six) hours as needed for muscle spasms., Disp: 30 tablet, Rfl: 1   oxyCODONE (OXY IR/ROXICODONE) 5 MG immediate release tablet, Take 1-2 tablets (5-10 mg total) by mouth every 6 (six) hours as needed for moderate pain (pain score 4-6) (pain score 4-6)., Disp: 30 tablet, Rfl: 0   albuterol (PROVENTIL) (2.5 MG/3ML) 0.083% nebulizer solution, Take 3 mLs (2.5 mg total) by nebulization every 6 (six) hours as needed for wheezing or shortness of breath., Disp: 75 mL, Rfl: 0  No Known Allergies  Objective:   BP 128/78   Pulse 75   Resp  18   Ht 6' (1.829 m)   Wt 292 lb 1.3 oz (132.5 kg)   SpO2 91%   BMI 39.61 kg/m      03/04/2024    3:11 PM 02/27/2024    1:35 PM 02/21/2024    3:09 PM  Vitals with BMI  Height 6\' 0"  6\' 0"    Weight 292 lbs 1 oz 295 lbs   BMI 39.6 40   Systolic 128 125 191  Diastolic 78 81 68  Pulse 75 71 70     Physical Exam Vitals and nursing note reviewed.  Constitutional:      Appearance: Normal appearance. He is normal weight.     Comments: Ambulating with cane  HENT:     Head: Normocephalic and atraumatic.  Cardiovascular:     Rate and Rhythm: Normal rate and regular rhythm.     Pulses: Normal pulses.     Heart sounds: Normal heart sounds.  Pulmonary:     Effort: Pulmonary effort is normal.     Breath sounds: Normal breath sounds.  Skin:    General: Skin is warm and dry.     Capillary Refill: Capillary refill takes less than 2 seconds.     Comments: Incision c/d/I with steristrips in place  Neurological:     General: No focal deficit present.     Mental Status: He is alert and oriented to person, place, and time. Mental status is at baseline.  Psychiatric:        Mood and Affect: Mood normal.        Behavior: Behavior normal.        Thought Content: Thought content normal.        Judgment: Judgment normal.     Assessment & Plan:  Status post total replacement of right hip Assessment & Plan: Aaron Clarke is doing great post operatively. No complications. Incision healing well. Exceeding PT goals. Ambulating with cane and continuing home exercises. Encouraged to continue physical activity as tolerated. Pain well controlled. Appt with ortho in 1 month. Return to office PRN.       Follow up plan: Return in about 6 months (around 09/03/2024) for chronic follow-up with labs 1 week prior.  Jenelle Mis, FNP

## 2024-03-04 NOTE — Assessment & Plan Note (Signed)
 Aaron Clarke is doing great post operatively. No complications. Incision healing well. Exceeding PT goals. Ambulating with cane and continuing home exercises. Encouraged to continue physical activity as tolerated. Pain well controlled. Appt with ortho in 1 month. Return to office PRN.

## 2024-03-11 ENCOUNTER — Other Ambulatory Visit: Payer: Self-pay

## 2024-03-11 ENCOUNTER — Emergency Department (HOSPITAL_COMMUNITY)

## 2024-03-11 ENCOUNTER — Encounter (HOSPITAL_COMMUNITY): Payer: Self-pay

## 2024-03-11 ENCOUNTER — Inpatient Hospital Stay (HOSPITAL_COMMUNITY)
Admission: EM | Admit: 2024-03-11 | Discharge: 2024-03-14 | DRG: 300 | Disposition: A | Discharge status: Home / Self Care | Attending: Internal Medicine | Admitting: Internal Medicine

## 2024-03-11 DIAGNOSIS — R739 Hyperglycemia, unspecified: Secondary | ICD-10-CM

## 2024-03-11 DIAGNOSIS — R7989 Other specified abnormal findings of blood chemistry: Secondary | ICD-10-CM | POA: Diagnosis present

## 2024-03-11 DIAGNOSIS — I10 Essential (primary) hypertension: Secondary | ICD-10-CM | POA: Diagnosis present

## 2024-03-11 DIAGNOSIS — J449 Chronic obstructive pulmonary disease, unspecified: Secondary | ICD-10-CM | POA: Diagnosis present

## 2024-03-11 DIAGNOSIS — I2489 Other forms of acute ischemic heart disease: Secondary | ICD-10-CM | POA: Diagnosis present

## 2024-03-11 DIAGNOSIS — Z825 Family history of asthma and other chronic lower respiratory diseases: Secondary | ICD-10-CM

## 2024-03-11 DIAGNOSIS — Z96641 Presence of right artificial hip joint: Secondary | ICD-10-CM | POA: Diagnosis present

## 2024-03-11 DIAGNOSIS — E860 Dehydration: Secondary | ICD-10-CM | POA: Diagnosis present

## 2024-03-11 DIAGNOSIS — Z6838 Body mass index (BMI) 38.0-38.9, adult: Secondary | ICD-10-CM

## 2024-03-11 DIAGNOSIS — I129 Hypertensive chronic kidney disease with stage 1 through stage 4 chronic kidney disease, or unspecified chronic kidney disease: Secondary | ICD-10-CM | POA: Diagnosis present

## 2024-03-11 DIAGNOSIS — Z86711 Personal history of pulmonary embolism: Secondary | ICD-10-CM

## 2024-03-11 DIAGNOSIS — Z79899 Other long term (current) drug therapy: Secondary | ICD-10-CM

## 2024-03-11 DIAGNOSIS — Z7901 Long term (current) use of anticoagulants: Secondary | ICD-10-CM

## 2024-03-11 DIAGNOSIS — K219 Gastro-esophageal reflux disease without esophagitis: Secondary | ICD-10-CM | POA: Diagnosis present

## 2024-03-11 DIAGNOSIS — E871 Hypo-osmolality and hyponatremia: Secondary | ICD-10-CM | POA: Diagnosis not present

## 2024-03-11 DIAGNOSIS — Z86718 Personal history of other venous thrombosis and embolism: Secondary | ICD-10-CM

## 2024-03-11 DIAGNOSIS — Z7982 Long term (current) use of aspirin: Secondary | ICD-10-CM

## 2024-03-11 DIAGNOSIS — Z888 Allergy status to other drugs, medicaments and biological substances status: Secondary | ICD-10-CM

## 2024-03-11 DIAGNOSIS — E66812 Obesity, class 2: Secondary | ICD-10-CM | POA: Diagnosis present

## 2024-03-11 DIAGNOSIS — N179 Acute kidney failure, unspecified: Secondary | ICD-10-CM | POA: Diagnosis present

## 2024-03-11 DIAGNOSIS — N189 Chronic kidney disease, unspecified: Secondary | ICD-10-CM | POA: Diagnosis present

## 2024-03-11 DIAGNOSIS — R079 Chest pain, unspecified: Principal | ICD-10-CM

## 2024-03-11 DIAGNOSIS — Z8249 Family history of ischemic heart disease and other diseases of the circulatory system: Secondary | ICD-10-CM

## 2024-03-11 DIAGNOSIS — Z87891 Personal history of nicotine dependence: Secondary | ICD-10-CM

## 2024-03-11 DIAGNOSIS — T81718A Complication of other artery following a procedure, not elsewhere classified, initial encounter: Secondary | ICD-10-CM | POA: Diagnosis not present

## 2024-03-11 DIAGNOSIS — Z803 Family history of malignant neoplasm of breast: Secondary | ICD-10-CM

## 2024-03-11 DIAGNOSIS — Z800 Family history of malignant neoplasm of digestive organs: Secondary | ICD-10-CM

## 2024-03-11 DIAGNOSIS — J4489 Other specified chronic obstructive pulmonary disease: Secondary | ICD-10-CM | POA: Diagnosis present

## 2024-03-11 DIAGNOSIS — R942 Abnormal results of pulmonary function studies: Secondary | ICD-10-CM

## 2024-03-11 DIAGNOSIS — I2699 Other pulmonary embolism without acute cor pulmonale: Principal | ICD-10-CM | POA: Diagnosis present

## 2024-03-11 DIAGNOSIS — Z7951 Long term (current) use of inhaled steroids: Secondary | ICD-10-CM

## 2024-03-11 DIAGNOSIS — N1831 Chronic kidney disease, stage 3a: Secondary | ICD-10-CM | POA: Diagnosis present

## 2024-03-11 LAB — CBC
HCT: 43.4 % (ref 39.0–52.0)
Hemoglobin: 14.1 g/dL (ref 13.0–17.0)
MCH: 29.1 pg (ref 26.0–34.0)
MCHC: 32.5 g/dL (ref 30.0–36.0)
MCV: 89.7 fL (ref 80.0–100.0)
Platelets: 254 10*3/uL (ref 150–400)
RBC: 4.84 MIL/uL (ref 4.22–5.81)
RDW: 13.6 % (ref 11.5–15.5)
WBC: 9.8 10*3/uL (ref 4.0–10.5)
nRBC: 0 % (ref 0.0–0.2)

## 2024-03-11 LAB — BASIC METABOLIC PANEL WITH GFR
Anion gap: 13 (ref 5–15)
BUN: 53 mg/dL — ABNORMAL HIGH (ref 8–23)
CO2: 18 mmol/L — ABNORMAL LOW (ref 22–32)
Calcium: 9.2 mg/dL (ref 8.9–10.3)
Chloride: 102 mmol/L (ref 98–111)
Creatinine, Ser: 2.41 mg/dL — ABNORMAL HIGH (ref 0.61–1.24)
GFR, Estimated: 29 mL/min — ABNORMAL LOW (ref >60)
Glucose, Bld: 121 mg/dL — ABNORMAL HIGH (ref 70–99)
Potassium: 4.2 mmol/L (ref 3.5–5.1)
Sodium: 133 mmol/L — ABNORMAL LOW (ref 135–145)

## 2024-03-11 LAB — TROPONIN I (HIGH SENSITIVITY)
Troponin I (High Sensitivity): 183 ng/L (ref <18)
Troponin I (High Sensitivity): 266 ng/L (ref <18)

## 2024-03-11 MED ORDER — HEPARIN (PORCINE) 25000 UT/250ML-% IV SOLN
1800.0000 [IU]/h | INTRAVENOUS | Status: DC
  Administered 2024-03-11 – 2024-03-14 (×4): 1800 [IU]/h via INTRAVENOUS
  Filled 2024-03-11 (×5): qty 250

## 2024-03-11 MED ORDER — MORPHINE SULFATE (PF) 4 MG/ML IV SOLN
2.0000 mg | Freq: Once | INTRAVENOUS | Status: AC
  Administered 2024-03-11: 2 mg via INTRAVENOUS
  Filled 2024-03-11: qty 1

## 2024-03-11 MED ORDER — ONDANSETRON HCL 4 MG/2ML IJ SOLN
4.0000 mg | Freq: Once | INTRAMUSCULAR | Status: AC
  Administered 2024-03-11: 4 mg via INTRAVENOUS
  Filled 2024-03-11: qty 2

## 2024-03-11 MED ORDER — HEPARIN BOLUS VIA INFUSION
5000.0000 [IU] | Freq: Once | INTRAVENOUS | Status: AC
  Administered 2024-03-11: 5000 [IU] via INTRAVENOUS

## 2024-03-11 NOTE — ED Triage Notes (Signed)
 Pt POV c/o chest pain that started tonight that is located on the left side of chest that is radiating into left arm. Pt states he became short of breath too. No cardiac hx. Hx of hypertension. Pain 4/10 at this time.

## 2024-03-11 NOTE — Progress Notes (Signed)
 PHARMACY - ANTICOAGULATION CONSULT NOTE  Pharmacy Consult for Heparin  Indication: pulmonary embolus  No Known Allergies  Patient Measurements: Weight: 129.7 kg (286 lb)  Vital Signs: Temp: 97.9 F (36.6 C) (04/22 2110) Temp Source: Oral (04/22 2110) BP: 101/75 (04/22 2315) Pulse Rate: 78 (04/22 2315)  Labs: Recent Labs    03/11/24 2131  HGB 14.1  HCT 43.4  PLT 254  CREATININE 2.41*  TROPONINIHS 183*    Estimated Creatinine Clearance: 42.5 mL/min (A) (by C-G formula based on SCr of 2.41 mg/dL (H)).   Medical History: 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   Family history of breast cancer    mother   Hypertension    Osteoarthritis of right hip    PE (pulmonary thromboembolism) (HCC) 10/23/2022    Medications:  No current facility-administered medications on file prior to encounter.   Current Outpatient Medications on File Prior to Encounter  Medication Sig Dispense Refill   albuterol  (PROVENTIL ) (2.5 MG/3ML) 0.083% nebulizer solution Take 3 mLs (2.5 mg total) by nebulization every 6 (six) hours as needed for wheezing or shortness of breath. 75 mL 0   albuterol  (VENTOLIN  HFA) 108 (90 Base) MCG/ACT inhaler Inhale 1-2 puffs into the lungs every 6 (six) hours as needed for wheezing or shortness of breath. 8 g 0   aspirin  81 MG chewable tablet Chew 1 tablet (81 mg total) by mouth 2 (two) times daily. 30 tablet 0   aspirin  EC 81 MG tablet Take 81 mg by mouth daily. Swallow whole.     bisoprolol  (ZEBETA ) 5 MG tablet Take 1 tablet (5 mg total) by mouth daily. 30 tablet 11   budesonide -formoterol  (BREYNA ) 160-4.5 MCG/ACT inhaler Take 2 puffs first thing in am and then another 2 puffs about 12 hours later. 1 each 12   budesonide -formoterol  (SYMBICORT ) 160-4.5 MCG/ACT inhaler Take 2 puffs first thing in am and then another 2 puffs about 12  hours later. 1 each 11   celecoxib  (CELEBREX ) 200 MG capsule TAKE 1 CAPSULE (200 MG TOTAL) BY MOUTH 2 (TWO) TIMES DAILY BETWEEN MEALS AS NEEDED. 60 capsule 1   methocarbamol  (ROBAXIN ) 500 MG tablet Take 1 tablet (500 mg total) by mouth every 6 (six) hours as needed. 40 tablet 1   methocarbamol  (ROBAXIN ) 500 MG tablet Take 1 tablet (500 mg total) by mouth every 6 (six) hours as needed for muscle spasms. 30 tablet 1   oxyCODONE  (OXY IR/ROXICODONE ) 5 MG immediate release tablet Take 1-2 tablets (5-10 mg total) by mouth every 6 (six) hours as needed for moderate pain (pain score 4-6) (pain score 4-6). 30 tablet 0     Assessment: 68 y.o. male with possible PE awaiting VQ scan for heparin  Goal of Therapy:  Heparin  level 0.3-0.7 units/ml Monitor platelets by anticoagulation protocol: Yes   Plan:  Heparin  5000 units IV bolus, then start heparin  1800 units/hr Check heparin  level in 8 hours.   Carlota Chestnut 03/11/2024,11:28 PM

## 2024-03-12 ENCOUNTER — Emergency Department (HOSPITAL_COMMUNITY)

## 2024-03-12 ENCOUNTER — Telehealth: Payer: Self-pay | Admitting: Internal Medicine

## 2024-03-12 ENCOUNTER — Inpatient Hospital Stay (HOSPITAL_COMMUNITY)

## 2024-03-12 ENCOUNTER — Encounter (HOSPITAL_COMMUNITY): Payer: Self-pay

## 2024-03-12 DIAGNOSIS — E66812 Obesity, class 2: Secondary | ICD-10-CM | POA: Diagnosis present

## 2024-03-12 DIAGNOSIS — J4489 Other specified chronic obstructive pulmonary disease: Secondary | ICD-10-CM | POA: Diagnosis present

## 2024-03-12 DIAGNOSIS — Z888 Allergy status to other drugs, medicaments and biological substances status: Secondary | ICD-10-CM | POA: Diagnosis not present

## 2024-03-12 DIAGNOSIS — K219 Gastro-esophageal reflux disease without esophagitis: Secondary | ICD-10-CM | POA: Diagnosis present

## 2024-03-12 DIAGNOSIS — Z87891 Personal history of nicotine dependence: Secondary | ICD-10-CM | POA: Diagnosis not present

## 2024-03-12 DIAGNOSIS — R7989 Other specified abnormal findings of blood chemistry: Secondary | ICD-10-CM

## 2024-03-12 DIAGNOSIS — I2699 Other pulmonary embolism without acute cor pulmonale: Secondary | ICD-10-CM | POA: Diagnosis present

## 2024-03-12 DIAGNOSIS — J449 Chronic obstructive pulmonary disease, unspecified: Secondary | ICD-10-CM | POA: Diagnosis not present

## 2024-03-12 DIAGNOSIS — I10 Essential (primary) hypertension: Secondary | ICD-10-CM

## 2024-03-12 DIAGNOSIS — Z7901 Long term (current) use of anticoagulants: Secondary | ICD-10-CM | POA: Diagnosis not present

## 2024-03-12 DIAGNOSIS — Z803 Family history of malignant neoplasm of breast: Secondary | ICD-10-CM | POA: Diagnosis not present

## 2024-03-12 DIAGNOSIS — N179 Acute kidney failure, unspecified: Secondary | ICD-10-CM | POA: Diagnosis present

## 2024-03-12 DIAGNOSIS — N189 Chronic kidney disease, unspecified: Secondary | ICD-10-CM | POA: Diagnosis present

## 2024-03-12 DIAGNOSIS — I2609 Other pulmonary embolism with acute cor pulmonale: Secondary | ICD-10-CM | POA: Diagnosis not present

## 2024-03-12 DIAGNOSIS — Z86711 Personal history of pulmonary embolism: Secondary | ICD-10-CM | POA: Diagnosis not present

## 2024-03-12 DIAGNOSIS — Z7951 Long term (current) use of inhaled steroids: Secondary | ICD-10-CM | POA: Diagnosis not present

## 2024-03-12 DIAGNOSIS — E871 Hypo-osmolality and hyponatremia: Secondary | ICD-10-CM | POA: Diagnosis present

## 2024-03-12 DIAGNOSIS — Z7982 Long term (current) use of aspirin: Secondary | ICD-10-CM | POA: Diagnosis not present

## 2024-03-12 DIAGNOSIS — Z825 Family history of asthma and other chronic lower respiratory diseases: Secondary | ICD-10-CM | POA: Diagnosis not present

## 2024-03-12 DIAGNOSIS — N1831 Chronic kidney disease, stage 3a: Secondary | ICD-10-CM | POA: Diagnosis present

## 2024-03-12 DIAGNOSIS — I2489 Other forms of acute ischemic heart disease: Secondary | ICD-10-CM | POA: Diagnosis present

## 2024-03-12 DIAGNOSIS — Z96641 Presence of right artificial hip joint: Secondary | ICD-10-CM | POA: Diagnosis present

## 2024-03-12 DIAGNOSIS — Z6838 Body mass index (BMI) 38.0-38.9, adult: Secondary | ICD-10-CM | POA: Diagnosis not present

## 2024-03-12 DIAGNOSIS — Z86718 Personal history of other venous thrombosis and embolism: Secondary | ICD-10-CM | POA: Diagnosis not present

## 2024-03-12 DIAGNOSIS — Z8249 Family history of ischemic heart disease and other diseases of the circulatory system: Secondary | ICD-10-CM | POA: Diagnosis not present

## 2024-03-12 DIAGNOSIS — E860 Dehydration: Secondary | ICD-10-CM | POA: Diagnosis present

## 2024-03-12 DIAGNOSIS — Z79899 Other long term (current) drug therapy: Secondary | ICD-10-CM | POA: Diagnosis not present

## 2024-03-12 DIAGNOSIS — T81718A Complication of other artery following a procedure, not elsewhere classified, initial encounter: Secondary | ICD-10-CM | POA: Diagnosis present

## 2024-03-12 DIAGNOSIS — I129 Hypertensive chronic kidney disease with stage 1 through stage 4 chronic kidney disease, or unspecified chronic kidney disease: Secondary | ICD-10-CM | POA: Diagnosis present

## 2024-03-12 DIAGNOSIS — Z800 Family history of malignant neoplasm of digestive organs: Secondary | ICD-10-CM | POA: Diagnosis not present

## 2024-03-12 LAB — URINALYSIS, ROUTINE W REFLEX MICROSCOPIC
Bilirubin Urine: NEGATIVE
Glucose, UA: NEGATIVE mg/dL
Hgb urine dipstick: NEGATIVE
Ketones, ur: NEGATIVE mg/dL
Leukocytes,Ua: NEGATIVE
Nitrite: NEGATIVE
Protein, ur: NEGATIVE mg/dL
Specific Gravity, Urine: 1.014 (ref 1.005–1.030)
pH: 5 (ref 5.0–8.0)

## 2024-03-12 LAB — HIV ANTIBODY (ROUTINE TESTING W REFLEX): HIV Screen 4th Generation wRfx: NONREACTIVE

## 2024-03-12 LAB — MAGNESIUM: Magnesium: 2.3 mg/dL (ref 1.7–2.4)

## 2024-03-12 LAB — PHOSPHORUS: Phosphorus: 4.6 mg/dL (ref 2.5–4.6)

## 2024-03-12 LAB — HEPARIN LEVEL (UNFRACTIONATED)
Heparin Unfractionated: 0.48 [IU]/mL (ref 0.30–0.70)
Heparin Unfractionated: 0.33 [IU]/mL (ref 0.30–0.70)

## 2024-03-12 LAB — MRSA NEXT GEN BY PCR, NASAL: MRSA by PCR Next Gen: NOT DETECTED

## 2024-03-12 MED ORDER — OXYCODONE HCL 5 MG PO TABS: 5.0000 mg | ORAL_TABLET | Freq: Four times a day (QID) | ORAL | Status: DC | PRN

## 2024-03-12 MED ORDER — ONDANSETRON HCL 4 MG/2ML IJ SOLN: 4.0000 mg | Freq: Four times a day (QID) | INTRAMUSCULAR | Status: DC | PRN

## 2024-03-12 MED ORDER — SODIUM CHLORIDE 0.9 % IV SOLN: INTRAVENOUS | Status: AC

## 2024-03-12 MED ORDER — ALBUTEROL SULFATE (2.5 MG/3ML) 0.083% IN NEBU: 2.5000 mg | INHALATION_SOLUTION | Freq: Four times a day (QID) | RESPIRATORY_TRACT | Status: DC | PRN

## 2024-03-12 MED ORDER — BISOPROLOL FUMARATE 5 MG PO TABS
5.0000 mg | ORAL_TABLET | Freq: Every day | ORAL | Status: DC
  Administered 2024-03-12 – 2024-03-14 (×3): 5 mg via ORAL
  Filled 2024-03-12 (×3): qty 1

## 2024-03-12 MED ORDER — ASPIRIN 325 MG PO TABS
325.0000 mg | ORAL_TABLET | Freq: Once | ORAL | Status: AC
  Administered 2024-03-12: 325 mg via ORAL
  Filled 2024-03-12: qty 1

## 2024-03-12 MED ORDER — BUDESONIDE 0.5 MG/2ML IN SUSP
0.5000 mg | Freq: Two times a day (BID) | RESPIRATORY_TRACT | Status: DC
  Administered 2024-03-12 – 2024-03-14 (×5): 0.5 mg via RESPIRATORY_TRACT
  Filled 2024-03-12 (×5): qty 2

## 2024-03-12 MED ORDER — ACETAMINOPHEN 325 MG PO TABS
650.0000 mg | ORAL_TABLET | Freq: Four times a day (QID) | ORAL | Status: DC | PRN
  Administered 2024-03-13: 650 mg via ORAL
  Filled 2024-03-12: qty 2

## 2024-03-12 MED ORDER — TECHNETIUM TO 99M ALBUMIN AGGREGATED
4.0000 | Freq: Once | INTRAVENOUS | Status: AC | PRN
  Administered 2024-03-12: 4.4 via INTRAVENOUS

## 2024-03-12 MED ORDER — CHLORHEXIDINE GLUCONATE CLOTH 2 % EX PADS
6.0000 | MEDICATED_PAD | Freq: Every day | CUTANEOUS | Status: DC
  Administered 2024-03-12 – 2024-03-14 (×3): 6 via TOPICAL

## 2024-03-12 MED ORDER — METHOCARBAMOL 500 MG PO TABS: 500.0000 mg | ORAL_TABLET | Freq: Three times a day (TID) | ORAL | Status: DC | PRN

## 2024-03-12 MED ORDER — ACETAMINOPHEN 650 MG RE SUPP: 650.0000 mg | Freq: Four times a day (QID) | RECTAL | Status: DC | PRN

## 2024-03-12 MED ORDER — PANTOPRAZOLE SODIUM 40 MG PO TBEC
40.0000 mg | DELAYED_RELEASE_TABLET | Freq: Every day | ORAL | Status: DC
  Administered 2024-03-12 – 2024-03-14 (×3): 40 mg via ORAL
  Filled 2024-03-12 (×3): qty 1

## 2024-03-12 MED ORDER — ONDANSETRON HCL 4 MG PO TABS: 4.0000 mg | ORAL_TABLET | Freq: Four times a day (QID) | ORAL | Status: DC | PRN

## 2024-03-12 NOTE — Progress Notes (Signed)
   03/12/24 1426  TOC Brief Assessment  Insurance and Status Reviewed  Patient has primary care physician Yes  Home environment has been reviewed Home  Prior level of function: Independent  Prior/Current Home Services No current home services  Social Drivers of Health Review SDOH reviewed no interventions necessary  Readmission risk has been reviewed Yes  Transition of care needs no transition of care needs at this time    Transition of Care Department [facility]) has reviewed patient and no TOC needs have been identified at this time. We will continue to monitor patient advancement through interdisciplinary progression rounds. If new patient transition needs arise, please place a TOC consult.

## 2024-03-12 NOTE — Assessment & Plan Note (Signed)
-   Patient describing pleuritic chest pain - Most likely demand ischemia in the setting of pulmonary embolism - No acute ischemic changes appreciated on EKG or telemetry - Will check 2D echo to rule out right heart strain - Acute on chronic renal failure also participating in elevated troponin levels. - Will provide fluid resuscitation, continue the use of bisoprolol ; patient receiving heparin  drip for PE and will follow clinical response. -Continue telemetry monitoring.

## 2024-03-12 NOTE — Assessment & Plan Note (Signed)
-  Body mass index is 38.78 kg/m. - Low-calorie diet and portion control discussed with patient.

## 2024-03-12 NOTE — Assessment & Plan Note (Signed)
-   Most likely provoked in the setting of recent orthopedic surgical intervention - Continue heparin  drip - Follow-up 2D echo - Continue supportive care, maintain adequate hydration and follow clinical response. -Hopefully able to transition to oral anticoagulation therapy in the next 24-48 hours. -Hemodynamically stable at the moment. -Analgesic therapy to be provided as needed along with muscle relaxant medications for pleuritic chest discomfort.

## 2024-03-12 NOTE — H&P (Signed)
 History and Physical    Patient: Aaron Clarke NWG:[id number] DOB: September 22, 1956 DOA: 03/11/2024 DOS: the patient was seen and examined on 03/12/2024 PCP: Jenelle Mis, FNP   Patient coming from: Home  Chief Complaint: Pleuritic chest pain/dyspnea on exertion.  HPI: Aaron Clarke is a 68 y.o. male with medical history significant of asthma/COPD, hypertension, osteoarthritis, chronic kidney disease stage IIIa prior history of DVT/PE and history of total hip replacement approximately 3 weeks ago; who presented to the hospital complaining of shortness of breath and dyspnea on exertion.  Patient reports no fever, no sick contacts, no nausea/vomiting, no dysuria, no hematuria, no blurred vision/headache and no focal weaknesses.  Workup in the ED demonstrating acute kidney injury on chronic kidney disease and a VQ scan with intermediate probability for pulmonary embolism.  [facility] has been contacted to place patient in the hospital for further evaluation and management.  Review of Systems: As mentioned in the history of present illness. All other systems reviewed and are negative. 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   Family history of breast cancer    mother   Hypertension    Osteoarthritis of right hip    PE (pulmonary thromboembolism) (HCC) 10/23/2022   Past Surgical History:  Procedure Laterality Date   CHOLECYSTECTOMY  12/19/2011   Procedure: LAPAROSCOPIC CHOLECYSTECTOMY WITH INTRAOPERATIVE CHOLANGIOGRAM;  Surgeon: Levert Ready, MD;  Location: [facility];  Service: General;  Laterality: N/A;   COLONOSCOPY WITH PROPOFOL  N/A 09/13/2022   Procedure: COLONOSCOPY WITH PROPOFOL ;  Surgeon: Selena Daily, MD;  Location: [facility] ENDOSCOPY;  Service: Gastroenterology;  Laterality: N/A;   Colonscopy     Fatty tumor  2003   L chest   TOTAL HIP  ARTHROPLASTY Right 02/19/2024   Procedure: ARTHROPLASTY, HIP, TOTAL, ANTERIOR APPROACH;  Surgeon: Arnie Lao, MD;  Location: [facility] OR;  Service: Orthopedics;  Laterality: Right;   Social History:  reports that he quit smoking about 35 years ago. His smoking use included cigarettes. He started smoking about 49 years ago. He has never used smokeless tobacco. He reports that he does not currently use alcohol. He reports that he does not use drugs.  Allergies  Allergen Reactions   Eliquis  [Apixaban ] Anxiety    With previous encounters    Family History  Problem Relation Age of Onset   Cancer Mother        breast   Cancer Father        colon   Heart disease Father    Asthma Sister    Anesthesia problems Neg Hx     Prior to Admission medications   Medication Sig Start Date End Date Taking? Authorizing Provider  albuterol  (PROVENTIL ) (2.5 MG/3ML) 0.083% nebulizer solution Take 3 mLs (2.5 mg total) by nebulization every 6 (six) hours as needed for wheezing or shortness of breath. 12/27/23   Mozell Arias, MD  albuterol  (VENTOLIN  HFA) 108 (90 Base) MCG/ACT inhaler Inhale 1-2 puffs into the lungs every 6 (six) hours as needed for wheezing or shortness of breath. 08/20/22   Vernestine Gondola, PA-C  aspirin  81 MG chewable tablet Chew 1 tablet (81 mg total) by mouth 2 (two) times daily. 02/21/24   Arnie Lao, MD  bisoprolol  (ZEBETA ) 5 MG tablet Take 1 tablet (5 mg total) by mouth daily. 01/02/24   Vernestine Gondola  B, MD  budesonide -formoterol  (BREYNA ) 160-4.5 MCG/ACT inhaler Take 2 puffs first thing in am and then another 2 puffs about 12 hours later. 02/27/24   Diamond Formica, MD  budesonide -formoterol  (SYMBICORT ) 160-4.5 MCG/ACT inhaler Take 2 puffs first thing in am and then another 2 puffs about 12 hours later. 01/02/24   Diamond Formica, MD  celecoxib  (CELEBREX ) 200 MG capsule TAKE 1 CAPSULE (200 MG TOTAL) BY MOUTH 2 (TWO) TIMES DAILY BETWEEN MEALS AS NEEDED. 01/03/24   Arnie Lao, MD  methocarbamol  (ROBAXIN ) 500 MG tablet Take 1 tablet (500 mg total) by mouth every 6 (six) hours as needed. 12/03/23   Arnie Lao, MD  methocarbamol  (ROBAXIN ) 500 MG tablet Take 1 tablet (500 mg total) by mouth every 6 (six) hours as needed for muscle spasms. 02/21/24   Arnie Lao, MD  oxyCODONE  (OXY IR/ROXICODONE ) 5 MG immediate release tablet Take 1-2 tablets (5-10 mg total) by mouth every 6 (six) hours as needed for moderate pain (pain score 4-6) (pain score 4-6). 03/03/24   Arnie Lao, MD    Physical Exam: Vitals:   03/12/24 0400 03/12/24 0515 03/12/24 0710 03/12/24 0725  BP: (!) 104/51 99/68 95/60    Pulse: 67 71 70   Resp: 18 18 (!) 8   Temp:  97.9 F (36.6 C)  97.7 F (36.5 C)  TempSrc:  Oral  Oral  SpO2: 93% 97% 97%   Weight:  129.7 kg    Height:  6' (1.829 m)     General exam: Alert, awake, oriented x 3, reports initial winded with activity, experiencing mild pleuritic discomfort. Respiratory system: Good air movement bilaterally; no using accessory muscle.  No wheezing, no crackles.  2 L nasal cannula for comfort in place. Cardiovascular system:RRR. No rubs or gallops; no JVD. Gastrointestinal system: Abdomen is obese, nondistended, soft and nontender. No organomegaly or masses felt. Normal bowel sounds heard. Central nervous system: Moving 4 limbs spontaneously.  No focal neurological deficits. Extremities: No cyanosis or clubbing, +pedal pulses Skin: No petechiae. Psychiatry: Judgement and insight appear normal. Mood & affect appropriate.   Data Reviewed: MRSA PCR: Not detected Magnesium: 2.3 Basic metabolic panel: Sodium 133, potassium 4.2, chloride 102, bicarb 18, glucose 121, BUN 53, creatinine 2.41 and GFR 29 CBC: White blood cells 9.8, hemoglobin 14.1 and platelet count 254K Troponin: 183 >> 266   Assessment and Plan: Pulmonary embolism (HCC) - Most likely provoked in the setting of recent orthopedic surgical  intervention - Continue heparin  drip - Follow-up 2D echo - Continue supportive care, maintain adequate hydration and follow clinical response. -Hopefully able to transition to oral anticoagulation therapy in the next 24-48 hours. -Hemodynamically stable at the moment. -Analgesic therapy to be provided as needed along with muscle relaxant medications for pleuritic chest discomfort.  Acute kidney injury superimposed on chronic kidney disease (HCC) - In the setting of dehydration/prerenal azotemia -Minimize nephrotoxic agents - Will check urine analysis - Maintain adequate hydration - Follow renal function trend - Avoid the use of contrast and hypotension.  Elevated troponin - Patient describing pleuritic chest pain - Most likely demand ischemia in the setting of pulmonary embolism - No acute ischemic changes appreciated on EKG or telemetry - Will check 2D echo to rule out right heart strain - Acute on chronic renal failure also participating in elevated troponin levels. - Will provide fluid resuscitation, continue the use of bisoprolol ; patient receiving heparin  drip for PE and will follow clinical response. -Continue telemetry monitoring.  GERD (gastroesophageal reflux disease) - Continue PPI.  COPD (chronic obstructive pulmonary disease) (HCC) - No acute exacerbation currently appreciated - Continue bronchodilator management - Flutter valve has been encouraged.  Class 2 obesity -Body mass index is 38.78 kg/m. - Low-calorie diet and portion control discussed with patient.   Advance Care Planning:   Code Status: Full Code   Consults: none   Family Communication: no family at bedside  Severity of Illness: The appropriate patient status for this patient is INPATIENT. Inpatient status is judged to be reasonable and necessary in order to provide the required intensity of service to ensure the patient's safety. The patient's presenting symptoms, physical exam findings, and  initial radiographic and laboratory data in the context of their chronic comorbidities is felt to place them at high risk for further clinical deterioration. Furthermore, it is not anticipated that the patient will be medically stable for discharge from the hospital within 2 midnights of admission.   * I certify that at the point of admission it is my clinical judgment that the patient will require inpatient hospital care spanning beyond 2 midnights from the point of admission due to high intensity of service, high risk for further deterioration and high frequency of surveillance required.*  Author: Justina Oman, MD 03/12/2024 7:43 AM  For on call review [email or web address].

## 2024-03-12 NOTE — ED Notes (Signed)
 ED TO INPATIENT HANDOFF REPORT  ED Nurse Name and Phone #: Micah Ade RN, [phone]   S Name/Age/Gender Aaron Clarke 68 y.o. male Room/Bed: APA02/APA02  Code Status   Code Status: Prior  Home/SNF/Other Home Patient oriented to: self, place, time, and situation Is this baseline? Yes   Triage Complete: Triage complete  Chief Complaint Pulmonary embolism (HCC) [I26.99]  Triage Note Pt POV c/o chest pain that started tonight that is located on the left side of chest that is radiating into left arm. Pt states he became short of breath too. No cardiac hx. Hx of hypertension. Pain 4/10 at this time.    Allergies No Known Allergies  Level of Care/Admitting Diagnosis ED Disposition     ED Disposition  Admit   Condition  --   Comment  Hospital Area: [facility] [[id number]]  Level of Care: Stepdown [14]  Covid Evaluation: Asymptomatic - no recent exposure (last 10 days) testing not required  Diagnosis: Pulmonary embolism [facility]) [[id number]]  Admitting Physician: ADEFESO, OLADAPO [[id number]]  Attending Physician: ADEFESO, OLADAPO [[id number]]  Certification:: I certify this patient will need inpatient services for at least 2 midnights  Expected Medical Readiness: 03/15/2024          B Medical/Surgery History 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   Family history of breast cancer    mother   Hypertension    Osteoarthritis of right hip    PE (pulmonary thromboembolism) (HCC) 10/23/2022   Past Surgical History:  Procedure Laterality Date   CHOLECYSTECTOMY  12/19/2011   Procedure: LAPAROSCOPIC CHOLECYSTECTOMY WITH INTRAOPERATIVE CHOLANGIOGRAM;  Surgeon: Levert Ready, MD;  Location: [facility];  Service: General;  Laterality: N/A;   COLONOSCOPY WITH PROPOFOL  N/A 09/13/2022   Procedure: COLONOSCOPY WITH PROPOFOL ;  Surgeon: Selena Daily, MD;  Location: [facility] ENDOSCOPY;  Service: Gastroenterology;  Laterality: N/A;   Colonscopy     Fatty tumor  2003   L chest   TOTAL HIP ARTHROPLASTY Right 02/19/2024   Procedure: ARTHROPLASTY, HIP, TOTAL, ANTERIOR APPROACH;  Surgeon: Arnie Lao, MD;  Location: [facility] OR;  Service: Orthopedics;  Laterality: Right;     A IV Location/Drains/Wounds Patient Lines/Drains/Airways Status     Active Line/Drains/Airways     Name Placement date Placement time Site Days   Peripheral IV 02/19/24 18 G Left;Posterior Hand 02/19/24  0840  Hand  22   Peripheral IV 03/11/24 18 G Right Antecubital 03/11/24  2302  Antecubital  1            Intake/Output Last 24 hours No intake or output data in the 24 hours ending 03/12/24 0330  Labs/Imaging Results for orders placed or performed during the hospital encounter of 03/11/24 (from the past 48 hours)  Basic metabolic panel     Status: Abnormal   Collection Time: 03/11/24  9:31 PM  Result Value Ref Range   Sodium 133 (L) 135 - 145 mmol/L   Potassium 4.2 3.5 - 5.1 mmol/L   Chloride 102 98 - 111 mmol/L   CO2 18 (L) 22 - 32 mmol/L   Glucose, Bld 121 (H) 70 - 99 mg/dL    Comment: Glucose reference range applies only to samples taken after fasting for at least 8 hours.   BUN 53 (H) 8 - 23 mg/dL   Creatinine, Ser 0.27 (  H) 0.61 - 1.24 mg/dL   Calcium 9.2 8.9 - 16.1 mg/dL   GFR, Estimated 29 (L) >60 mL/min    Comment: (NOTE) Calculated using the CKD-EPI Creatinine Equation (2021)    Anion gap 13 5 - 15    Comment: Performed at [facility], [address] [place], [place] [address]  CBC     Status: None   Collection Time: 03/11/24  9:31 PM  Result Value Ref Range   WBC 9.8 4.0 - 10.5 K/uL   RBC 4.84 4.22 - 5.81 MIL/uL   Hemoglobin 14.1 13.0 - 17.0 g/dL   HCT 54.0 98.1 - 19.1 %   MCV 89.7 80.0 - 100.0 fL   MCH 29.1 26.0 - 34.0 pg   MCHC 32.5 30.0 - 36.0 g/dL   RDW 47.8 29.5 - 62.1 %   Platelets 254 150 - 400 K/uL   nRBC 0.0  0.0 - 0.2 %    Comment: Performed at [facility], [address] [place], [place] [address]  Troponin I (High Sensitivity)     Status: Abnormal   Collection Time: 03/11/24  9:31 PM  Result Value Ref Range   Troponin I (High Sensitivity) 183 (HH) <18 ng/L    Comment: CRITICAL RESULT CALLED TO, READ BACK BY AND VERIFIED WITH MOSTELLER,M ON 03/11/24 AT 2246 BY LOY,C (NOTE) Elevated high sensitivity troponin I (hsTnI) values and significant  changes across serial measurements may suggest ACS but many other  chronic and acute conditions are known to elevate hsTnI results.  Refer to the "Links" section for chest pain algorithms and additional  guidance. Performed at [facility], [address] [place], [place] [address]   Troponin I (High Sensitivity)     Status: Abnormal   Collection Time: 03/11/24 10:56 PM  Result Value Ref Range   Troponin I (High Sensitivity) 266 (HH) <18 ng/L    Comment: CRITICAL RESULT CALLED TO, READ BACK BY AND VERIFIED WITH MACI MOSTELLER [id number], VIRAY,J (NOTE) Elevated high sensitivity troponin I (hsTnI) values and significant  changes across serial measurements may suggest ACS but many other  chronic and acute conditions are known to elevate hsTnI results.  Refer to the "Links" section for chest pain algorithms and additional  guidance. Performed at [facility], [address], [place], [place] [address]    NM Pulmonary Perfusion Result Date: 03/12/2024 CLINICAL DATA:  Chest pain and shortness of breath.  PE suspected. EXAM: NUCLEAR MEDICINE PERFUSION LUNG SCAN TECHNIQUE: Perfusion images were obtained in multiple projections after intravenous injection of radiopharmaceutical. Ventilation scans intentionally deferred if perfusion scan and chest x-ray adequate for interpretation during COVID 19 epidemic. RADIOPHARMACEUTICALS:  4.4 mCi Tc-39m MAA IV COMPARISON:  Chest radiograph 03/11/2024 FINDINGS: Heterogenous perfusion of the lungs likely due to underlying  lung parenchymal disease. There is a wedge-shaped perfusion defect in the right upper lobe. IMPRESSION: Intermediate probability for pulmonary embolism. If there is ongoing concern for PE, CTA of the chest is recommended. Electronically Signed   By: Rozell Cornet M.D.   On: 03/12/2024 01:53   DG Chest 2 View Result Date: 03/11/2024 CLINICAL DATA:  Chest pain EXAM: CHEST - 2 VIEW COMPARISON:  Chest x-ray 12/27/2023 again seen FINDINGS: The heart size and mediastinal contours are within normal limits. Are calcified left hilar lymph nodes. Both lungs are clear. The visualized skeletal structures are unremarkable. IMPRESSION: No active cardiopulmonary disease. Electronically Signed   By: Tyron Gallon M.D.   On: 03/11/2024 21:37    Pending Labs  Unresulted Labs (From admission, onward)     Start     Ordered   03/13/24 0500  Heparin  level (unfractionated)  Daily,   R      03/11/24 2330   03/13/24 0500  CBC  Daily,   R      03/11/24 2330   03/12/24 0800  Heparin  level (unfractionated)  Once-Timed,   URGENT        03/11/24 2330            Vitals/Pain Today's Vitals   03/12/24 0240 03/12/24 0300 03/12/24 0313 03/12/24 0315  BP: 107/75 112/80    Pulse: 77 74    Resp: (!) 21 (!) 22    Temp:    98 F (36.7 C)  TempSrc:    Oral  SpO2: 95% 94%    Weight:      PainSc:   4      Isolation Precautions No active isolations  Medications Medications  heparin  ADULT infusion 100 units/mL (25000 units/250mL) (1,800 Units/hr Intravenous New Bag/Given 03/11/24 2353)  morphine  (PF) 4 MG/ML injection 2 mg (2 mg Intravenous Given 03/11/24 2305)  ondansetron  (ZOFRAN ) injection 4 mg (4 mg Intravenous Given 03/11/24 2304)  heparin  bolus via infusion 5,000 Units (5,000 Units Intravenous Bolus from Bag 03/11/24 2353)  aspirin  tablet 325 mg (325 mg Oral Given 03/12/24 0133)  technetium albumin  aggregated (MAA) injection solution 4 millicurie (4.4 millicuries Intravenous Contrast Given 03/12/24 0115)     Mobility walks with device     Focused Assessments Cardiac Assessment Handoff:  Cardiac Rhythm: Normal sinus rhythm Lab Results  Component Value Date   CKTOTAL 160 10/23/2022   Lab Results  Component Value Date   DDIMER 0.62 (H) 01/02/2024   Does the Patient currently have chest pain? Yes    R Recommendations: See Admitting Provider Note  Report given to:   Additional Notes:

## 2024-03-12 NOTE — Progress Notes (Signed)
 PHARMACY - ANTICOAGULATION CONSULT NOTE  Pharmacy Consult for Heparin  Indication: pulmonary embolus  Allergies  Allergen Reactions   Eliquis  [Apixaban ] Anxiety    With previous encounters    Patient Measurements: Height: 6' (182.9 cm) Weight: 129.7 kg (285 lb 15 oz) IBW/kg (Calculated) : 77.6 HEPARIN  DW (KG): 106.8  Vital Signs: Temp: 98.1 F (36.7 C) (04/23 1138) Temp Source: Oral (04/23 1138) BP: 100/68 (04/23 1600) Pulse Rate: 65 (04/23 1600)  Labs: Recent Labs    03/11/24 2131 03/11/24 2256 03/12/24 0829 03/12/24 1615  HGB 14.1  --   --   --   HCT 43.4  --   --   --   PLT 254  --   --   --   HEPARINUNFRC  --   --  0.48 0.33  CREATININE 2.41*  --   --   --   TROPONINIHS 183* 266*  --   --     Estimated Creatinine Clearance: 42.5 mL/min (A) (by C-G formula based on SCr of 2.41 mg/dL (H)).   Medical History: 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   Family history of breast cancer    mother   Hypertension    Osteoarthritis of right hip    PE (pulmonary thromboembolism) (HCC) 10/23/2022    Medications:  No current facility-administered medications on file prior to encounter.   Current Outpatient Medications on File Prior to Encounter  Medication Sig Dispense Refill   albuterol  (PROVENTIL ) (2.5 MG/3ML) 0.083% nebulizer solution Take 3 mLs (2.5 mg total) by nebulization every 6 (six) hours as needed for wheezing or shortness of breath. 75 mL 0   albuterol  (VENTOLIN  HFA) 108 (90 Base) MCG/ACT inhaler Inhale 1-2 puffs into the lungs every 6 (six) hours as needed for wheezing or shortness of breath. 8 g 0   aspirin  81 MG chewable tablet Chew 1 tablet (81 mg total) by mouth 2 (two) times daily. 30 tablet 0   bisoprolol  (ZEBETA ) 5 MG tablet Take 1 tablet (5 mg total) by mouth daily. 30 tablet 11   budesonide -formoterol  (BREYNA )  160-4.5 MCG/ACT inhaler Take 2 puffs first thing in am and then another 2 puffs about 12 hours later. 1 each 12   budesonide -formoterol  (SYMBICORT ) 160-4.5 MCG/ACT inhaler Take 2 puffs first thing in am and then another 2 puffs about 12 hours later. 1 each 11   celecoxib  (CELEBREX ) 200 MG capsule TAKE 1 CAPSULE (200 MG TOTAL) BY MOUTH 2 (TWO) TIMES DAILY BETWEEN MEALS AS NEEDED. 60 capsule 1   methocarbamol  (ROBAXIN ) 500 MG tablet Take 1 tablet (500 mg total) by mouth every 6 (six) hours as needed. 40 tablet 1   methocarbamol  (ROBAXIN ) 500 MG tablet Take 1 tablet (500 mg total) by mouth every 6 (six) hours as needed for muscle spasms. 30 tablet 1   oxyCODONE  (OXY IR/ROXICODONE ) 5 MG immediate release tablet Take 1-2 tablets (5-10 mg total) by mouth every 6 (six) hours as needed for moderate pain (pain score 4-6) (pain score 4-6). 30 tablet 0   [DISCONTINUED] aspirin  EC 81 MG tablet Take 81 mg by mouth daily. Swallow whole.       Assessment: 68 y.o. male presenting with chest pain and recent hip surgery. Troponin is elevated and rising. V/Q scan is intermediate probability for PE. There is a wedge-shaped perfusion defect  in the right upper lobe. PE is the most likely cause for pain and elevated troponin is most likely demand ischemia. Pharmacy has been consulted for heparin  dosing for PE.   Heparin  level: 0.33 - therapeutic  Goal of Therapy:  Heparin  level 0.3-0.7 units/ml Monitor platelets by anticoagulation protocol: Yes   Plan:  Continue heparin  infusion 1800 units/hr Check heparin  level daily Continue to monitor H&H and platelets.   Alicia Apa Pharmacy Student 03/12/2024,5:00 PM

## 2024-03-12 NOTE — Progress Notes (Signed)
 PHARMACY - ANTICOAGULATION CONSULT NOTE  Pharmacy Consult for Heparin  Indication: pulmonary embolus  Allergies  Allergen Reactions   Eliquis  [Apixaban ] Anxiety    With previous encounters    Patient Measurements: Height: 6' (182.9 cm) Weight: 129.7 kg (285 lb 15 oz) IBW/kg (Calculated) : 77.6 HEPARIN  DW (KG): 106.8  Vital Signs: Temp: 97.7 F (36.5 C) (04/23 0725) Temp Source: Oral (04/23 0725) BP: 118/69 (04/23 0830) Pulse Rate: 74 (04/23 0830)  Labs: Recent Labs    03/11/24 2131 03/11/24 2256 03/12/24 0829  HGB 14.1  --   --   HCT 43.4  --   --   PLT 254  --   --   HEPARINUNFRC  --   --  0.48  CREATININE 2.41*  --   --   TROPONINIHS 183* 266*  --     Estimated Creatinine Clearance: 42.5 mL/min (A) (by C-G formula based on SCr of 2.41 mg/dL (H)).   Medical History: 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   Family history of breast cancer    mother   Hypertension    Osteoarthritis of right hip    PE (pulmonary thromboembolism) (HCC) 10/23/2022    Medications:  No current facility-administered medications on file prior to encounter.   Current Outpatient Medications on File Prior to Encounter  Medication Sig Dispense Refill   albuterol  (PROVENTIL ) (2.5 MG/3ML) 0.083% nebulizer solution Take 3 mLs (2.5 mg total) by nebulization every 6 (six) hours as needed for wheezing or shortness of breath. 75 mL 0   albuterol  (VENTOLIN  HFA) 108 (90 Base) MCG/ACT inhaler Inhale 1-2 puffs into the lungs every 6 (six) hours as needed for wheezing or shortness of breath. 8 g 0   aspirin  81 MG chewable tablet Chew 1 tablet (81 mg total) by mouth 2 (two) times daily. 30 tablet 0   bisoprolol  (ZEBETA ) 5 MG tablet Take 1 tablet (5 mg total) by mouth daily. 30 tablet 11   budesonide -formoterol  (BREYNA ) 160-4.5 MCG/ACT inhaler Take 2 puffs first  thing in am and then another 2 puffs about 12 hours later. 1 each 12   budesonide -formoterol  (SYMBICORT ) 160-4.5 MCG/ACT inhaler Take 2 puffs first thing in am and then another 2 puffs about 12 hours later. 1 each 11   celecoxib  (CELEBREX ) 200 MG capsule TAKE 1 CAPSULE (200 MG TOTAL) BY MOUTH 2 (TWO) TIMES DAILY BETWEEN MEALS AS NEEDED. 60 capsule 1   methocarbamol  (ROBAXIN ) 500 MG tablet Take 1 tablet (500 mg total) by mouth every 6 (six) hours as needed. 40 tablet 1   methocarbamol  (ROBAXIN ) 500 MG tablet Take 1 tablet (500 mg total) by mouth every 6 (six) hours as needed for muscle spasms. 30 tablet 1   oxyCODONE  (OXY IR/ROXICODONE ) 5 MG immediate release tablet Take 1-2 tablets (5-10 mg total) by mouth every 6 (six) hours as needed for moderate pain (pain score 4-6) (pain score 4-6). 30 tablet 0   [DISCONTINUED] aspirin  EC 81 MG tablet Take 81 mg by mouth daily. Swallow whole.       Assessment: 68 y.o. male presenting with chest pain and recent hip surgery. Troponin is elevated and rising. V/Q scan is intermediate probability for PE. There is a wedge-shaped perfusion defect in the right upper lobe. PE is the most likely cause for pain and elevated troponin is most  likely demand ischemia. Pharmacy has been consulted for heparin  dosing for PE.   Heparin  level: 0.48 - therapeutic  Goal of Therapy:  Heparin  level 0.3-0.7 units/ml Monitor platelets by anticoagulation protocol: Yes   Plan:  Continue heparin  infusion 1800 units/hr Check heparin  level in 8 hours at 1600   [employer] 03/12/2024,9:42 AM

## 2024-03-12 NOTE — Assessment & Plan Note (Signed)
-  In the setting of dehydration/prerenal azotemia and continue use of nephrotoxic agents. -Continue minimize nephrotoxic agents -Will check urine analysis -Maintain adequate hydration -Continue to follow renal function trend -Avoid the use of contrast and hypotension.

## 2024-03-12 NOTE — ED Provider Notes (Signed)
 [place] EMERGENCY DEPARTMENT AT [facility] Provider Note   CSN: [id number] Arrival date & time: 03/11/24  2056     History  No chief complaint on file.   Aaron Clarke is a 68 y.o. male.  68 year old male with past medical history of hypertension and pulmonary embolism in the past presenting to the emergency department today with left-sided chest pain.  The patient states he is having more of a left-sided chest pressure.  He states that this has been going now for the past day or so.  The patient does report significant dyspnea on exertion with this.  Denies any associated nausea or diaphoresis.  He does report that he had surgery on his hip on the first of this month.  He denies any hemoptysis.  He came to the ER today for further evaluation regarding this.  He is not on any anticoagulation currently.        Home Medications Prior to Admission medications   Medication Sig Start Date End Date Taking? Authorizing Provider  albuterol  (PROVENTIL ) (2.5 MG/3ML) 0.083% nebulizer solution Take 3 mLs (2.5 mg total) by nebulization every 6 (six) hours as needed for wheezing or shortness of breath. 12/27/23   Mozell Arias, MD  albuterol  (VENTOLIN  HFA) 108 [phone] Base) MCG/ACT inhaler Inhale 1-2 puffs into the lungs every 6 (six) hours as needed for wheezing or shortness of breath. 08/20/22   Vernestine Gondola, PA-C  aspirin  81 MG chewable tablet Chew 1 tablet (81 mg total) by mouth 2 (two) times daily. 02/21/24   Arnie Lao, MD  aspirin  EC 81 MG tablet Take 81 mg by mouth daily. Swallow whole.    [provider]  bisoprolol  (ZEBETA ) 5 MG tablet Take 1 tablet (5 mg total) by mouth daily. 01/02/24   Diamond Formica, MD  budesonide -formoterol  (BREYNA ) 160-4.5 MCG/ACT inhaler Take 2 puffs first thing in am and then another 2 puffs about 12 hours later. 02/27/24   Diamond Formica, MD  budesonide -formoterol  (SYMBICORT ) 160-4.5 MCG/ACT inhaler Take 2 puffs first thing  in am and then another 2 puffs about 12 hours later. 01/02/24   Diamond Formica, MD  celecoxib  (CELEBREX ) 200 MG capsule TAKE 1 CAPSULE (200 MG TOTAL) BY MOUTH 2 (TWO) TIMES DAILY BETWEEN MEALS AS NEEDED. 01/03/24   Arnie Lao, MD  methocarbamol  (ROBAXIN ) 500 MG tablet Take 1 tablet (500 mg total) by mouth every 6 (six) hours as needed. 12/03/23   Arnie Lao, MD  methocarbamol  (ROBAXIN ) 500 MG tablet Take 1 tablet (500 mg total) by mouth every 6 (six) hours as needed for muscle spasms. 02/21/24   Arnie Lao, MD  oxyCODONE  (OXY IR/ROXICODONE ) 5 MG immediate release tablet Take 1-2 tablets (5-10 mg total) by mouth every 6 (six) hours as needed for moderate pain (pain score 4-6) (pain score 4-6). 03/03/24   Arnie Lao, MD      Allergies    Patient has no known allergies.    Review of Systems   Review of Systems  Respiratory:  Positive for shortness of breath.   Cardiovascular:  Positive for chest pain.  All other systems reviewed and are negative.   Physical Exam Updated Vital Signs BP 115/74   Pulse 78   Temp 97.9 F (36.6 C) (Oral)   Resp 15   Wt 129.7 kg   SpO2 93%   BMI 38.79 kg/m  Physical Exam Vitals and nursing note reviewed.   Gen: NAD Eyes: PERRL, EOMI  HEENT: no oropharyngeal swelling Neck: trachea midline Resp: clear to auscultation bilaterally Card: RRR, no murmurs, rubs, or gallops Abd: nontender, nondistended Extremities: no calf tenderness, no edema Vascular: 2+ radial pulses bilaterally, 2+ DP pulses bilaterally Neuro: No focal deficits Skin: no rashes Psyc: acting appropriately   ED Results / Procedures / Treatments   Labs (all labs ordered are listed, but only abnormal results are displayed) Labs Reviewed  BASIC METABOLIC PANEL WITH GFR - Abnormal; Notable for the following components:      Result Value   Sodium 133 (*)    CO2 18 (*)    Glucose, Bld 121 (*)    BUN 53 (*)    Creatinine, Ser 2.41 (*)     GFR, Estimated 29 (*)    All other components within normal limits  TROPONIN I (HIGH SENSITIVITY) - Abnormal; Notable for the following components:   Troponin I (High Sensitivity) 183 (*)    All other components within normal limits  TROPONIN I (HIGH SENSITIVITY) - Abnormal; Notable for the following components:   Troponin I (High Sensitivity) 266 (*)    All other components within normal limits  CBC  HEPARIN  LEVEL (UNFRACTIONATED)    EKG EKG Interpretation Date/Time:  Tuesday March 11 2024 21:16:24 EDT Ventricular Rate:  89 PR Interval:  152 QRS Duration:  114 QT Interval:  386 QTC Calculation: 469 R Axis:   85  Text Interpretation: Normal sinus rhythm Normal ECG When compared with ECG of 27-Dec-2023 19:59, Nonspecific T wave abnormality now evident in Anterior leads Confirmed by Abner Hoffman [phone]) on 03/11/2024 9:19:04 PM  Radiology DG Chest 2 View Result Date: 03/11/2024 CLINICAL DATA:  Chest pain EXAM: CHEST - 2 VIEW COMPARISON:  Chest x-ray 12/27/2023 again seen FINDINGS: The heart size and mediastinal contours are within normal limits. Are calcified left hilar lymph nodes. Both lungs are clear. The visualized skeletal structures are unremarkable. IMPRESSION: No active cardiopulmonary disease. Electronically Signed   By: Tyron Gallon M.D.   On: 03/11/2024 21:37    Procedures Procedures    Medications Ordered in ED Medications  heparin  ADULT infusion 100 units/mL (25000 units/250mL) (1,800 Units/hr Intravenous New Bag/Given 03/11/24 2353)  aspirin  tablet 325 mg (has no administration in time range)  morphine  (PF) 4 MG/ML injection 2 mg (2 mg Intravenous Given 03/11/24 2305)  ondansetron  (ZOFRAN ) injection 4 mg (4 mg Intravenous Given 03/11/24 2304)  heparin  bolus via infusion 5,000 Units (5,000 Units Intravenous Bolus from Bag 03/11/24 2353)    ED Course/ Medical Decision Making/ A&P                                 Medical Decision Making 68 year old male with past  medical history of hypertension and pulmonary embolism in the past presenting to the emergency department today with concern for chest pain and dyspnea on exertion.  I will further evaluate the patient here with basic labs Wels and EKG, chest x-ray, and troponin for further evaluation for ACS, pulmonary edema, pulmonary infiltrates, or pneumothorax.  Will also obtain further imaging for pulmonary embolism given the patient's recent surgery and history of PE as he is high risk for this with high pretest probability.  The patient's creatinine is elevated compared to his baseline and his GFR is less than 30.  Ventilation/perfusion scan is ordered to evaluate for PE.  The patient's initial troponins are elevated.  He is given morphine .  I did start  the patient on a heparin  infusion for possible pulmonary embolism which should cover for NSTEMI as well.  He will be admitted for further evaluation management.  VQ scan pending at the time of signout.  CRITICAL CARE Performed by: Carin Charleston   Total critical care time: 38 minutes  Critical care time was exclusive of separately billable procedures and treating other patients.  Critical care was necessary to treat or prevent imminent or life-threatening deterioration.  Critical care was time spent personally by me on the following activities: development of treatment plan with patient and/or surrogate as well as nursing, discussions with consultants, evaluation of patient's response to treatment, examination of patient, obtaining history from patient or surrogate, ordering and performing treatments and interventions, ordering and review of laboratory studies, ordering and review of radiographic studies, pulse oximetry and re-evaluation of patient's condition.   Amount and/or Complexity of Data Reviewed Labs: ordered. Radiology: ordered.  Risk Prescription drug management.           Final Clinical Impression(s) / ED Diagnoses Final diagnoses:   Chest pain, unspecified type    Rx / DC Orders ED Discharge Orders     None         Carin Charleston, MD 03/12/24 [id number]

## 2024-03-12 NOTE — ED Provider Notes (Signed)
 Care assumed from Dr. Charlee Conine, patient with chest pain and recent hip surgery. Troponin is elevated and rising. His creatinine is too high for CT angiogram, V/Q scan is pending. He is on heparin , will need to be admitted.  V/Q. scan is intermediate probability for pulmonary embolism.  There is a wedge-shaped perfusion defect in the right upper lobe.  I have independently viewed these images, and agree with the radiologist's interpretation.  I feel that pulmonary embolism is the most likely cause for his pain and that elevated troponin is most likely demand ischemia.  I have discussed the case with Dr. Elyse Hand of [facility], who agrees to admit the patient.  Results for orders placed or performed during the hospital encounter of 03/11/24  Basic metabolic panel   Collection Time: 03/11/24  9:31 PM  Result Value Ref Range   Sodium 133 (L) 135 - 145 mmol/L   Potassium 4.2 3.5 - 5.1 mmol/L   Chloride 102 98 - 111 mmol/L   CO2 18 (L) 22 - 32 mmol/L   Glucose, Bld 121 (H) 70 - 99 mg/dL   BUN 53 (H) 8 - 23 mg/dL   Creatinine, Ser 7.82 (H) 0.61 - 1.24 mg/dL   Calcium 9.2 8.9 - 95.6 mg/dL   GFR, Estimated 29 (L) >60 mL/min   Anion gap 13 5 - 15  CBC   Collection Time: 03/11/24  9:31 PM  Result Value Ref Range   WBC 9.8 4.0 - 10.5 K/uL   RBC 4.84 4.22 - 5.81 MIL/uL   Hemoglobin 14.1 13.0 - 17.0 g/dL   HCT 21.3 08.6 - 57.8 %   MCV 89.7 80.0 - 100.0 fL   MCH 29.1 26.0 - 34.0 pg   MCHC 32.5 30.0 - 36.0 g/dL   RDW 46.9 62.9 - 52.8 %   Platelets 254 150 - 400 K/uL   nRBC 0.0 0.0 - 0.2 %  Troponin I (High Sensitivity)   Collection Time: 03/11/24  9:31 PM  Result Value Ref Range   Troponin I (High Sensitivity) 183 (HH) <18 ng/L  Troponin I (High Sensitivity)   Collection Time: 03/11/24 10:56 PM  Result Value Ref Range   Troponin I (High Sensitivity) 266 (HH) <18 ng/L   NM Pulmonary Perfusion Result Date: 03/12/2024 CLINICAL DATA:  Chest pain and shortness of breath.  PE suspected. EXAM:  NUCLEAR MEDICINE PERFUSION LUNG SCAN TECHNIQUE: Perfusion images were obtained in multiple projections after intravenous injection of radiopharmaceutical. Ventilation scans intentionally deferred if perfusion scan and chest x-ray adequate for interpretation during COVID 19 epidemic. RADIOPHARMACEUTICALS:  4.4 mCi Tc-28m MAA IV COMPARISON:  Chest radiograph 03/11/2024 FINDINGS: Heterogenous perfusion of the lungs likely due to underlying lung parenchymal disease. There is a wedge-shaped perfusion defect in the right upper lobe. IMPRESSION: Intermediate probability for pulmonary embolism. If there is ongoing concern for PE, CTA of the chest is recommended. Electronically Signed   By: Rozell Cornet M.D.   On: 03/12/2024 01:53   DG Chest 2 View Result Date: 03/11/2024 CLINICAL DATA:  Chest pain EXAM: CHEST - 2 VIEW COMPARISON:  Chest x-ray 12/27/2023 again seen FINDINGS: The heart size and mediastinal contours are within normal limits. Are calcified left hilar lymph nodes. Both lungs are clear. The visualized skeletal structures are unremarkable. IMPRESSION: No active cardiopulmonary disease. Electronically Signed   By: Tyron Gallon M.D.   On: 03/11/2024 21:37   DG Pelvis Portable Result Date: 02/19/2024 CLINICAL DATA:  Right hip arthroplasty EXAM: PORTABLE PELVIS 1-2 VIEWS COMPARISON:  10/27/2023 FINDINGS: 2 frontal views of the pelvis including bilateral hips. Portions of the right greater trochanter are excluded by collimation. Right hip arthroplasty is identified in the expected position without evidence of acute complication. Stable mild left hip osteoarthritis. IMPRESSION: 1. Unremarkable right hip arthroplasty. Electronically Signed   By: Bobbye Burrow M.D.   On: 02/19/2024 15:55   DG HIP UNILAT WITH PELVIS 1V RIGHT Result Date: 02/19/2024 CLINICAL DATA:  Right hip arthroplasty EXAM: DG HIP (WITH OR WITHOUT PELVIS) 1V RIGHT COMPARISON:  10/27/2023 FINDINGS: 5 fluoroscopic images are obtained during the  performance of the procedure and are provided for interpretation only. Placement of a right hip arthroplasty is identified, in the expected position. Please refer to the operative report. Fluoroscopy time: 21 seconds, 5.8 mGy IMPRESSION: 1. Right hip arthroplasty.  Please refer to the operative report. Electronically Signed   By: Bobbye Burrow M.D.   On: 02/19/2024 15:54   DG C-Arm 1-60 Min-No Report Result Date: 02/19/2024 Fluoroscopy was utilized by the requesting physician.  No radiographic interpretation.      Alissa April, MD 03/12/24 [phone]

## 2024-03-12 NOTE — Assessment & Plan Note (Signed)
-   No acute exacerbation currently appreciated - Continue home bronchodilator regimen. - Flutter valve has been encouraged.

## 2024-03-12 NOTE — Assessment & Plan Note (Signed)
 Continue PPI ?

## 2024-03-12 NOTE — Telephone Encounter (Signed)
 Camilo Cella (daughter) came in the office and stated the patient has been admitted to the hospital for a blood clot ---he has received his HST and is not sure when he will be able to complete the study and does not wish to be charged .  He is going to mail it back for now and will let us  know when we can mail it back to him.

## 2024-03-12 NOTE — ED Notes (Signed)
 Patient transported for VQ Scan

## 2024-03-13 DIAGNOSIS — I10 Essential (primary) hypertension: Secondary | ICD-10-CM | POA: Diagnosis not present

## 2024-03-13 DIAGNOSIS — E66812 Obesity, class 2: Secondary | ICD-10-CM | POA: Diagnosis not present

## 2024-03-13 DIAGNOSIS — I2699 Other pulmonary embolism without acute cor pulmonale: Secondary | ICD-10-CM | POA: Diagnosis not present

## 2024-03-13 DIAGNOSIS — J449 Chronic obstructive pulmonary disease, unspecified: Secondary | ICD-10-CM | POA: Diagnosis not present

## 2024-03-13 LAB — BASIC METABOLIC PANEL WITH GFR
Anion gap: 9 (ref 5–15)
BUN: 31 mg/dL — ABNORMAL HIGH (ref 8–23)
CO2: 22 mmol/L (ref 22–32)
Calcium: 8.6 mg/dL — ABNORMAL LOW (ref 8.9–10.3)
Chloride: 106 mmol/L (ref 98–111)
Creatinine, Ser: 1.55 mg/dL — ABNORMAL HIGH (ref 0.61–1.24)
GFR, Estimated: 49 mL/min — ABNORMAL LOW (ref >60)
Glucose, Bld: 108 mg/dL — ABNORMAL HIGH (ref 70–99)
Potassium: 4.4 mmol/L (ref 3.5–5.1)
Sodium: 137 mmol/L (ref 135–145)

## 2024-03-13 LAB — CBC
HCT: 35.8 % — ABNORMAL LOW (ref 39.0–52.0)
Hemoglobin: 11.7 g/dL — ABNORMAL LOW (ref 13.0–17.0)
MCH: 29.6 pg (ref 26.0–34.0)
MCHC: 32.7 g/dL (ref 30.0–36.0)
MCV: 90.6 fL (ref 80.0–100.0)
Platelets: 167 10*3/uL (ref 150–400)
RBC: 3.95 MIL/uL — ABNORMAL LOW (ref 4.22–5.81)
RDW: 13.4 % (ref 11.5–15.5)
WBC: 7 10*3/uL (ref 4.0–10.5)
nRBC: 0 % (ref 0.0–0.2)

## 2024-03-13 LAB — HEPARIN LEVEL (UNFRACTIONATED): Heparin Unfractionated: 0.36 [IU]/mL (ref 0.30–0.70)

## 2024-03-13 NOTE — Progress Notes (Signed)
 PHARMACY - ANTICOAGULATION CONSULT NOTE  Pharmacy Consult for Heparin  Indication: pulmonary embolus  Allergies  Allergen Reactions   Eliquis  [Apixaban ] Anxiety    With previous encounters "Feels different" but not mentally    Patient Measurements: Height: 6' (182.9 cm) Weight: 129.7 kg (285 lb 15 oz) IBW/kg (Calculated) : 77.6 HEPARIN  DW (KG): 106.8  Vital Signs: Temp: 99.8 F (37.7 C) (04/24 0806) Temp Source: Axillary (04/24 0806) BP: 108/57 (04/24 0700) Pulse Rate: 59 (04/24 0700)  Labs: Recent Labs    03/11/24 2131 03/11/24 2256 03/12/24 0829 03/12/24 1615 03/13/24 0428  HGB 14.1  --   --   --  11.7*  HCT 43.4  --   --   --  35.8*  PLT 254  --   --   --  167  HEPARINUNFRC  --   --  0.48 0.33 0.36  CREATININE 2.41*  --   --   --  1.55*  TROPONINIHS 183* 266*  --   --   --     Estimated Creatinine Clearance: 66.1 mL/min (A) (by C-G formula based on SCr of 1.55 mg/dL (H)).   Medical History: 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   Family history of breast cancer    mother   Hypertension    Osteoarthritis of right hip    PE (pulmonary thromboembolism) (HCC) 10/23/2022    Medications:  No current facility-administered medications on file prior to encounter.   Current Outpatient Medications on File Prior to Encounter  Medication Sig Dispense Refill   albuterol  (PROVENTIL ) (2.5 MG/3ML) 0.083% nebulizer solution Take 3 mLs (2.5 mg total) by nebulization every 6 (six) hours as needed for wheezing or shortness of breath. 75 mL 0   albuterol  (VENTOLIN  HFA) 108 (90 Base) MCG/ACT inhaler Inhale 1-2 puffs into the lungs every 6 (six) hours as needed for wheezing or shortness of breath. 8 g 0   aspirin  81 MG chewable tablet Chew 1 tablet (81 mg total) by mouth 2 (two) times daily. 30 tablet 0   bisoprolol  (ZEBETA ) 5 MG tablet  Take 1 tablet (5 mg total) by mouth daily. 30 tablet 11   budesonide -formoterol  (SYMBICORT ) 160-4.5 MCG/ACT inhaler Take 2 puffs first thing in am and then another 2 puffs about 12 hours later. (Patient taking differently: Inhale 2 puffs into the lungs in the morning and at bedtime. Take 2 puffs first thing in am and then another 2 puffs about 12 hours later.) 1 each 11   celecoxib  (CELEBREX ) 200 MG capsule TAKE 1 CAPSULE (200 MG TOTAL) BY MOUTH 2 (TWO) TIMES DAILY BETWEEN MEALS AS NEEDED. (Patient taking differently: Take 200 mg by mouth 2 (two) times daily as needed for mild pain (pain score 1-3).) 60 capsule 1   methocarbamol  (ROBAXIN ) 500 MG tablet Take 1 tablet (500 mg total) by mouth every 6 (six) hours as needed for muscle spasms. 30 tablet 1   oxyCODONE  (OXY IR/ROXICODONE ) 5 MG immediate release tablet Take 1-2 tablets (5-10 mg total) by mouth every 6 (six) hours as needed for moderate pain (pain score 4-6) (pain score 4-6). 30 tablet 0     Assessment: 68 y.o. male presenting with chest pain and recent hip surgery. Troponin is elevated and rising. V/Q scan is intermediate probability for PE. There is a wedge-shaped perfusion defect in the  right upper lobe. PE is the most likely cause for pain and elevated troponin is most likely demand ischemia. Pharmacy has been consulted for heparin  dosing for PE.   Heparin  level: 0.36 - therapeutic  Goal of Therapy:  Heparin  level 0.3-0.7 units/ml Monitor platelets by anticoagulation protocol: Yes   Plan:  Continue heparin  infusion 1800 units/hr Check heparin  level daily Continue to monitor H&H and platelets.   Cliffton Dama, PharmD Clinical Pharmacist 03/13/2024 8:16 AM

## 2024-03-13 NOTE — Plan of Care (Signed)
  Problem: Acute Rehab PT Goals(only PT should resolve) Goal: Pt Will Go Supine/Side To Sit Outcome: Progressing Flowsheets (Taken 03/13/2024 1539) Pt will go Supine/Side to Sit:  Independently  with modified independence Goal: Patient Will Transfer Sit To/From Stand Outcome: Progressing Flowsheets (Taken 03/13/2024 1539) Patient will transfer sit to/from stand:  Independently  with modified independence Goal: Pt Will Transfer Bed To Chair/Chair To Bed Outcome: Progressing Flowsheets (Taken 03/13/2024 1539) Pt will Transfer Bed to Chair/Chair to Bed: with modified independence Goal: Pt Will Ambulate Outcome: Progressing Flowsheets (Taken 03/13/2024 1539) Pt will Ambulate:  > 125 feet  with modified independence  with least restrictive assistive device   3:40 PM, 03/13/24 Walton Guppy, MPT Physical Therapist with [facility] [phone] office [phone] mobile phone

## 2024-03-13 NOTE — TOC Initial Note (Signed)
 Transition of Care [facility]) - Initial/Assessment Note    Patient Details  Name: Aaron Clarke MRN: [id number] Date of Birth: May 21, 1956  Transition of Care [facility]) CM/SW Contact:    Geraldina Klinefelter, RN Phone Number: 03/13/2024, 7:52 PM  Clinical Narrative:                 Pt is high readmission risk, admitted c/PE. His last hospital admission, which was earlier this month, was r/t hip surgery. He is from home c/his fiancee, who is a capable cg. Independent c/ADLs.   Expected Discharge Plan: Home/Self Care Barriers to Discharge: Continued Medical Work up   Patient Goals and CMS Choice Patient states their goals for this hospitalization and ongoing recovery are:: To return home. CMS Medicare.gov Compare Post Acute Care list provided to:: Patient Choice offered to / list presented to : Patient [place] ownership interest in [facility].provided to:: Patient    Expected Discharge Plan and Services   Discharge Planning Services: CM Consult   Living arrangements for the past 2 months: Single Family Home                 Prior Living Arrangements/Services Living arrangements for the past 2 months: Single Family Home Lives with:: Significant Other Patient language and need for interpreter reviewed:: Yes Do you feel safe going back to the place where you live?: Yes      Need for Family Participation in Patient Care: Yes (Comment) Care giver support system in place?: Yes (comment)   Criminal Activity/Legal Involvement Pertinent to Current Situation/Hospitalization: No - Comment as needed  Activities of Daily Living   ADL Screening (condition at time of admission) Independently performs ADLs?: No Does the patient have a NEW difficulty with bathing/dressing/toileting/self-feeding that is expected to last >3 days?: No Does the patient have a NEW difficulty with getting in/out of bed, walking, or climbing stairs that is expected to last >3 days?: No Does the patient have a  NEW difficulty with communication that is expected to last >3 days?: No Is the patient deaf or have difficulty hearing?: No Does the patient have difficulty seeing, even when wearing glasses/contacts?: No Does the patient have difficulty concentrating, remembering, or making decisions?: No  Permission Sought/Granted   Emotional Assessment Appearance:: Appears stated age Attitude/Demeanor/Rapport: Engaged Affect (typically observed): Appropriate Orientation: : Oriented to Self, Oriented to  Time, Oriented to Place, Oriented to Situation Alcohol / Substance Use: Not Applicable Psych Involvement: No (comment)  Admission diagnosis:  Hyponatremia [E87.1] Abnormal lung scan [R94.2] Pulmonary embolism (HCC) [I26.99] Elevated troponin [R79.89] Acute kidney injury (nontraumatic) (HCC) [N17.9] Elevated random blood glucose level [R73.9] Chest pain, unspecified type [R07.9] Patient Active Problem List   Diagnosis Date Noted   Pulmonary embolism (HCC) 03/12/2024   Acute kidney injury superimposed on chronic kidney disease (HCC) 03/12/2024   Class 2 obesity 03/12/2024   COPD (chronic obstructive pulmonary disease) (HCC) 03/12/2024   GERD (gastroesophageal reflux disease) 03/12/2024   OSA (obstructive sleep apnea) 02/27/2024   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   Asthmatic bronchitis , chronic (HCC) 01/02/2024   Physical exam, annual 12/17/2023   Morbid obesity (HCC) 12/17/2023   BMI 38.0-38.9,adult 12/17/2023   Pain in joint of right hip 11/07/2022   Strain of muscle of right hip 11/07/2022   Trochanteric bursitis of right hip 11/07/2022   Dyspnea on exertion 10/24/2022   Elevated troponin 10/24/2022   Pulmonary embolus (HCC) 10/24/2022   Acute hypoxic respiratory failure (HCC) 10/23/2022  HTN (hypertension) 10/18/2022   History of colonic polyps    Family history of colon cancer in father    Polyp of ascending colon    Cecal polyp    Chronic diffuse otitis  externa of both ears 09/04/2022   Knee arthropathy 09/04/2022   Screening for colon cancer 08/07/2022   Lumbago of lumbar region with sciatica 08/07/2022   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   Rosacea 05/20/2021   Cholecystitis with cholelithiasis 12/19/2011   Class 3 severe obesity due to excess calories with serious comorbidity and body mass index (BMI) of 40.0 to 44.9 in adult [facility]) 12/14/2011   Asthma 12/14/2011   PCP:  Jenelle Mis, FNP Pharmacy:   [phone] - [place], [place] - [address] AT [facility] [address]  [place] [address] Phone: [phone] Fax: [phone]  [id number] - [place],  - [address] [facility] [address] [place] [address] Phone: [phone] Fax: [phone]  Social Drivers of Health (SDOH) Social History: SDOH Screenings   Food Insecurity: No Food Insecurity (03/12/2024)  Housing: Low Risk  (03/12/2024)  Transportation Needs: No Transportation Needs (03/12/2024)  Utilities: Not At Risk (03/12/2024)  Alcohol Screen: Low Risk  (01/29/2024)  Depression (PHQ2-9): Low Risk  (03/04/2024)  Financial Resource Strain: Low Risk  (01/29/2024)  Physical Activity: Inactive (01/29/2024)  Social Connections: Moderately Isolated (03/12/2024)  Stress: No Stress Concern Present (01/29/2024)  Tobacco Use: Medium Risk (03/11/2024)  Health Literacy: Adequate Health Literacy (01/29/2024)   SDOH Interventions:  Readmission Risk Interventions    03/13/2024    7:48 PM  Readmission Risk Prevention Plan  Transportation Screening Complete  PCP or Specialist Appt within 3-5 Days Complete  HRI or Home Care Consult Complete  Social Work Consult for Recovery Care Planning/Counseling Complete  Palliative Care Screening Not Applicable

## 2024-03-13 NOTE — Assessment & Plan Note (Signed)
-   Stable vital signs. - Will continue holding antihypertensive agents at the moment while allowing renal function to records.

## 2024-03-13 NOTE — Evaluation (Signed)
 Physical Therapy Evaluation Patient Details Name: Aaron Clarke MRN: [id number] DOB: [birth year] Today's Date: 03/13/2024  History of Present Illness  Aaron Clarke is a 68 y.o. male with medical history significant of asthma/COPD, hypertension, osteoarthritis, chronic kidney disease stage IIIa prior history of DVT/PE and history of total hip replacement approximately 3 weeks ago; who presented to the hospital complaining of shortness of breath and dyspnea on exertion.     Patient reports no fever, no sick contacts, no nausea/vomiting, no dysuria, no hematuria, no blurred vision/headache and no focal weaknesses.   Clinical Impression  Patient demonstrates slightly labored movement for sitting up at bedside, fair/good return for transferring to/from chair and commode and tolerated ambulation in room/hallways without loss of balance and no need for an AD. Patient tolerated sitting up in chair after therapy. Patient will benefit from continued skilled physical therapy in hospital and recommended venue below to increase strength, balance, endurance for safe ADLs and gait.          If plan is discharge home, recommend the following: A little help with walking and/or transfers;A little help with bathing/dressing/bathroom;Help with stairs or ramp for entrance;Assistance with cooking/housework   Can travel by private vehicle        Equipment Recommendations None recommended by PT  Recommendations for Other Services       Functional Status Assessment Patient has had a recent decline in their functional status and demonstrates the ability to make significant improvements in function in a reasonable and predictable amount of time.     Precautions / Restrictions Precautions Precautions: Fall Restrictions Weight Bearing Restrictions Per Provider Order: No      Mobility  Bed Mobility Overal bed mobility: Modified Independent                  Transfers Overall transfer  level: Modified independent                      Ambulation/Gait Ambulation/Gait assistance: Supervision, Contact guard assist Gait Distance (Feet): 100 Feet Assistive device: None Gait Pattern/deviations: Decreased step length - right, Decreased step length - left, Decreased stride length Gait velocity: decreased     General Gait Details: slightly labored movement without loss of balance with good return for ambulating in room, hallway without use of an AD  Stairs            Wheelchair Mobility     Tilt Bed    Modified Rankin (Stroke Patients Only)       Balance Overall balance assessment: Needs assistance Sitting-balance support: Feet unsupported, No upper extremity supported Sitting balance-Leahy Scale: Good Sitting balance - Comments: seated at EOB   Standing balance support: During functional activity, No upper extremity supported Standing balance-Leahy Scale: Fair Standing balance comment: fair/good without AD                             Pertinent Vitals/Pain Pain Assessment Pain Assessment: No/denies pain    Home Living Family/patient expects to be discharged to:: Private residence Living Arrangements: Spouse/significant other Available Help at Discharge: Family;Available 24 hours/day Type of Home: House Home Access: Stairs to enter Entrance Stairs-Rails: Can reach both Entrance Stairs-Number of Steps: 6   Home Layout: One level Home Equipment: Cane - single Librarian, academic (2 wheels)      Prior Function Prior Level of Function : Independent/Modified Independent;Working/employed;Driving  Mobility Comments: Community ambulation without AD, drives ADLs Comments: Independent     Extremity/Trunk Assessment   Upper Extremity Assessment Upper Extremity Assessment: Overall WFL for tasks assessed    Lower Extremity Assessment Lower Extremity Assessment: Generalized weakness    Cervical / Trunk  Assessment Cervical / Trunk Assessment: Normal  Communication   Communication Communication: No apparent difficulties    Cognition Arousal: Alert Behavior During Therapy: WFL for tasks assessed/performed   PT - Cognitive impairments: No apparent impairments                         Following commands: Intact       Cueing Cueing Techniques: Verbal cues     General Comments      Exercises     Assessment/Plan    PT Assessment Patient needs continued PT services  PT Problem List Decreased strength;Decreased activity tolerance;Decreased balance;Decreased mobility       PT Treatment Interventions DME instruction;Gait training;Stair training;Functional mobility training;Therapeutic activities;Therapeutic exercise;Balance training;Patient/family education    PT Goals (Current goals can be found in the Care Plan section)  Acute Rehab PT Goals Patient Stated Goal: return home with family to assist PT Goal Formulation: With patient Time For Goal Achievement: 03/17/24 Potential to Achieve Goals: Good    Frequency Min 2X/week     Co-evaluation               AM-PAC PT "6 Clicks" Mobility  Outcome Measure Help needed turning from your back to your side while in a flat bed without using bedrails?: None Help needed moving from lying on your back to sitting on the side of a flat bed without using bedrails?: None Help needed moving to and from a bed to a chair (including a wheelchair)?: None Help needed standing up from a chair using your arms (e.g., wheelchair or bedside chair)?: A Little Help needed to walk in hospital room?: A Little Help needed climbing 3-5 steps with a railing? : A Little 6 Click Score: 21    End of Session   Activity Tolerance: Patient tolerated treatment well;Patient limited by fatigue Patient left: in chair;with call bell/phone within reach Nurse Communication: Mobility status PT Visit Diagnosis: Unsteadiness on feet (R26.81);Other  abnormalities of gait and mobility (R26.89);Muscle weakness (generalized) (M62.81)    Time: 1435-1500 PT Time Calculation (min) (ACUTE ONLY): 25 min   Charges:   PT Evaluation $PT Eval Moderate Complexity: 1 Mod PT Treatments $Therapeutic Activity: 23-37 mins PT General Charges $$ ACUTE PT VISIT: 1 Visit         3:38 PM, 03/13/24 Walton Guppy, MPT Physical Therapist with [facility] [phone] office [phone] mobile phone

## 2024-03-13 NOTE — Progress Notes (Signed)
 Progress Note   Patient: Aaron Clarke ZOX:[id number] DOB: 1955/11/21 DOA: 03/11/2024     1 DOS: the patient was seen and examined on 03/13/2024   Brief hospital admission narrative: Ranson Belluomini is a 68 y.o. male with medical history significant of asthma/COPD, hypertension, osteoarthritis, chronic kidney disease stage IIIa prior history of DVT/PE and history of total hip replacement approximately 3 weeks ago; who presented to the hospital complaining of shortness of breath and dyspnea on exertion.   Patient reports no fever, no sick contacts, no nausea/vomiting, no dysuria, no hematuria, no blurred vision/headache and no focal weaknesses.   Workup in the ED demonstrating acute kidney injury on chronic kidney disease and a VQ scan with intermediate probability for pulmonary embolism.  [facility] has been contacted to place patient in the hospital for further evaluation and management.  Assessment and plan  GERD (gastroesophageal reflux disease) - Continue PPI.  COPD (chronic obstructive pulmonary disease) (HCC) - No acute exacerbation currently appreciated - Continue home bronchodilator regimen. - Flutter valve has been encouraged.  Class 2 obesity -Body mass index is 38.78 kg/m. - Low-calorie diet and portion control discussed with patient.  Pulmonary embolism (HCC) - Most likely provoked in the setting of recent orthopedic surgical intervention - Continue heparin  drip for another 24 hours. - Follow-up 2D echo - Continue supportive care, maintain adequate hydration and follow clinical response. - Planning to transition to oral Eliquis  at time of discharge. - Continue as needed analgesics.  Acute kidney injury superimposed on chronic kidney disease (HCC) -In the setting of dehydration/prerenal azotemia and continue use of nephrotoxic agents. -Continue minimize nephrotoxic agents -Will check urine analysis -Maintain adequate hydration -Continue to follow renal function  trend -Avoid the use of contrast and hypotension.  Elevated troponin - Patient describing pleuritic chest pain - Most likely demand ischemia in the setting of pulmonary embolism - No acute ischemic changes appreciated on EKG or telemetry - Will check 2D echo to rule out right heart strain - Acute on chronic renal failure also participating in elevated troponin levels. - Will provide fluid resuscitation, continue the use of bisoprolol ; patient receiving heparin  drip for PE and will follow clinical response. -Continue telemetry monitoring.  HTN (hypertension) - Stable vital signs. - Will continue holding antihypertensive agents at the moment while allowing renal function to records.  Subjective:  Febrile, no chest pain, no nausea vomiting.  Reports breathing is improved and is overall feeling better.  Physical Exam: Vitals:   03/13/24 1400 03/13/24 1500 03/13/24 1600 03/13/24 1700  BP:   (!) 142/71 139/83  Pulse: 66 (!) 157 66 (!) 107  Resp: 16 (!) 23 (!) 22 (!) 24  Temp:      TempSrc:      SpO2: 98% 93% 95% 96%  Weight:      Height:       General exam: Alert, awake, oriented x 3; feeling better and breathing easier.  Reports no chest pain.  1-2 L nasal cannula in place. Respiratory system: No using accessory muscles; no wheezing appreciated on exam. Cardiovascular system:RRR. No rubs or gallops; no JVD. Gastrointestinal system: Abdomen is obese, nondistended, soft and nontender. No organomegaly or masses felt. Normal bowel sounds heard. Central nervous system: No focal neurological deficits. Extremities: No cyanosis or clubbing. Skin: No petechiae. Psychiatry: Judgement and insight appear normal. Mood & affect appropriate.    Data Reviewed: CBC: WBC 7.0, hemoglobin 11.7 and platelet count 7K Basic metabolic panel:Sodium 137, potassium 4.4, chloride 106, bicarb 22, BUN 31,  creatinine 1.5 and GFR 49  Family Communication: No family bedside.  Disposition: Status is:  Inpatient Remains inpatient appropriate because: Continue IV therapy.  Anticipate discharge home when medically stable.  Time spent: 55 minutes  Author: Justina Oman, MD 03/13/2024 6:52 PM  For on call review [email or web address].

## 2024-03-14 ENCOUNTER — Inpatient Hospital Stay (HOSPITAL_COMMUNITY)

## 2024-03-14 ENCOUNTER — Telehealth (HOSPITAL_COMMUNITY): Payer: Self-pay | Admitting: Pharmacy Technician

## 2024-03-14 ENCOUNTER — Other Ambulatory Visit (HOSPITAL_COMMUNITY): Payer: Self-pay | Admitting: *Deleted

## 2024-03-14 ENCOUNTER — Other Ambulatory Visit (HOSPITAL_COMMUNITY): Payer: Self-pay

## 2024-03-14 DIAGNOSIS — K219 Gastro-esophageal reflux disease without esophagitis: Secondary | ICD-10-CM | POA: Diagnosis not present

## 2024-03-14 DIAGNOSIS — I10 Essential (primary) hypertension: Secondary | ICD-10-CM | POA: Diagnosis not present

## 2024-03-14 DIAGNOSIS — I2609 Other pulmonary embolism with acute cor pulmonale: Secondary | ICD-10-CM | POA: Diagnosis not present

## 2024-03-14 DIAGNOSIS — I2699 Other pulmonary embolism without acute cor pulmonale: Secondary | ICD-10-CM | POA: Diagnosis not present

## 2024-03-14 DIAGNOSIS — J449 Chronic obstructive pulmonary disease, unspecified: Secondary | ICD-10-CM | POA: Diagnosis not present

## 2024-03-14 LAB — ECHOCARDIOGRAM COMPLETE
AR max vel: 2.67 cm2
AV Area VTI: 3.14 cm2
AV Area mean vel: 2.87 cm2
AV Mean grad: 5.3 mmHg
AV Peak grad: 12.3 mmHg
Ao pk vel: 1.75 m/s
Area-P 1/2: 1.84 cm2
Height: 72 in
S' Lateral: 3.5 cm
Weight: 4574.99 [oz_av]

## 2024-03-14 LAB — CBC
HCT: 37.8 % — ABNORMAL LOW (ref 39.0–52.0)
Hemoglobin: 12.2 g/dL — ABNORMAL LOW (ref 13.0–17.0)
MCH: 29.3 pg (ref 26.0–34.0)
MCHC: 32.3 g/dL (ref 30.0–36.0)
MCV: 90.6 fL (ref 80.0–100.0)
Platelets: 166 10*3/uL (ref 150–400)
RBC: 4.17 MIL/uL — ABNORMAL LOW (ref 4.22–5.81)
RDW: 13.2 % (ref 11.5–15.5)
WBC: 6.5 10*3/uL (ref 4.0–10.5)
nRBC: 0 % (ref 0.0–0.2)

## 2024-03-14 LAB — HEPARIN LEVEL (UNFRACTIONATED): Heparin Unfractionated: 0.4 [IU]/mL (ref 0.30–0.70)

## 2024-03-14 MED ORDER — PERFLUTREN LIPID MICROSPHERE
1.0000 mL | INTRAVENOUS | Status: AC | PRN
  Administered 2024-03-14: 4 mL via INTRAVENOUS

## 2024-03-14 MED ORDER — PANTOPRAZOLE SODIUM 40 MG PO TBEC: 40.0000 mg | DELAYED_RELEASE_TABLET | Freq: Every day | ORAL | 1 refills | Status: DC

## 2024-03-14 MED ORDER — APIXABAN 5 MG PO TABS
5.0000 mg | ORAL_TABLET | Freq: Two times a day (BID) | ORAL | Status: DC
Start: 2024-03-21 — End: 2024-03-14

## 2024-03-14 MED ORDER — APIXABAN 5 MG PO TABS: ORAL_TABLET | ORAL | 6 refills | Status: DC

## 2024-03-14 MED ORDER — APIXABAN 5 MG PO TABS
10.0000 mg | ORAL_TABLET | Freq: Two times a day (BID) | ORAL | Status: DC
Start: 2024-03-14 — End: 2024-03-14
  Administered 2024-03-14: 10 mg via ORAL
  Filled 2024-03-14: qty 2

## 2024-03-14 NOTE — Plan of Care (Signed)

## 2024-03-14 NOTE — Progress Notes (Addendum)
 0830 patient alert x4 able to make all needs known on room air. 1310 patient having echo RN confirmed that patient has no allergy to eliquis 

## 2024-03-14 NOTE — Progress Notes (Signed)
 PHARMACY - ANTICOAGULATION CONSULT NOTE  Pharmacy Consult for Heparin  Indication: pulmonary embolus  Allergies  Allergen Reactions   Eliquis  [Apixaban ] Anxiety    With previous encounters "Feels different" but not mentally    Patient Measurements: Height: 6' (182.9 cm) Weight: 129.7 kg (285 lb 15 oz) IBW/kg (Calculated) : 77.6 HEPARIN  DW (KG): 106.8  Vital Signs: Temp: 98.1 F (36.7 C) (04/25 0421) Temp Source: Axillary (04/25 0421) BP: 148/77 (04/25 0600) Pulse Rate: 63 (04/25 0600)  Labs: Recent Labs    03/11/24 2131 03/11/24 2256 03/12/24 0829 03/12/24 1615 03/13/24 0428 03/14/24 0423  HGB 14.1  --   --   --  11.7* 12.2*  HCT 43.4  --   --   --  35.8* 37.8*  PLT 254  --   --   --  167 166  HEPARINUNFRC  --   --    < > 0.33 0.36 0.40  CREATININE 2.41*  --   --   --  1.55*  --   TROPONINIHS 183* 266*  --   --   --   --    < > = values in this interval not displayed.    Estimated Creatinine Clearance: 66.1 mL/min (A) (by C-G formula based on SCr of 1.55 mg/dL (H)).   Medical History: 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   Family history of breast cancer    mother   Hypertension    Osteoarthritis of right hip    PE (pulmonary thromboembolism) (HCC) 10/23/2022    Medications:  No current facility-administered medications on file prior to encounter.   Current Outpatient Medications on File Prior to Encounter  Medication Sig Dispense Refill   albuterol  (PROVENTIL ) (2.5 MG/3ML) 0.083% nebulizer solution Take 3 mLs (2.5 mg total) by nebulization every 6 (six) hours as needed for wheezing or shortness of breath. 75 mL 0   albuterol  (VENTOLIN  HFA) 108 (90 Base) MCG/ACT inhaler Inhale 1-2 puffs into the lungs every 6 (six) hours as needed for wheezing or shortness of breath. 8 g 0   aspirin  81 MG chewable tablet Chew 1  tablet (81 mg total) by mouth 2 (two) times daily. 30 tablet 0   bisoprolol  (ZEBETA ) 5 MG tablet Take 1 tablet (5 mg total) by mouth daily. 30 tablet 11   budesonide -formoterol  (SYMBICORT ) 160-4.5 MCG/ACT inhaler Take 2 puffs first thing in am and then another 2 puffs about 12 hours later. (Patient taking differently: Inhale 2 puffs into the lungs in the morning and at bedtime. Take 2 puffs first thing in am and then another 2 puffs about 12 hours later.) 1 each 11   celecoxib  (CELEBREX ) 200 MG capsule TAKE 1 CAPSULE (200 MG TOTAL) BY MOUTH 2 (TWO) TIMES DAILY BETWEEN MEALS AS NEEDED. (Patient taking differently: Take 200 mg by mouth 2 (two) times daily as needed for mild pain (pain score 1-3).) 60 capsule 1   methocarbamol  (ROBAXIN ) 500 MG tablet Take 1 tablet (500 mg total) by mouth every 6 (six) hours as needed for muscle spasms. 30 tablet 1   oxyCODONE  (OXY IR/ROXICODONE ) 5 MG immediate release tablet Take 1-2 tablets (5-10 mg total) by mouth every 6 (six) hours as needed for moderate pain (pain score 4-6) (pain score 4-6). 30 tablet 0     Assessment: 68 y.o. male presenting with  chest pain and recent hip surgery. Troponin is elevated and rising. V/Q scan is intermediate probability for PE. There is a wedge-shaped perfusion defect in the right upper lobe. PE is the most likely cause for pain and elevated troponin is most likely demand ischemia. Pharmacy has been consulted for heparin  dosing for PE.   Heparin  level: 0.36> 0.4 - therapeutic  Goal of Therapy:  Heparin  level 0.3-0.7 units/ml Monitor platelets by anticoagulation protocol: Yes   Plan:  Continue heparin  infusion 1800 units/hr Check heparin  level daily Continue to monitor H&H and platelets.   Jenesa Foresta, BS Pharm D, BCPS Clinical Pharmacist 03/14/2024 7:54 AM

## 2024-03-14 NOTE — Care Management Important Message (Signed)
 Important Message  Patient Details  Name: Aaron Clarke MRN: [id number] Date of Birth: [birth year]   Important Message Given:  N/A - LOS <3 / Initial given by admissions     [name] 03/14/2024, 3:55 PM

## 2024-03-14 NOTE — Discharge Summary (Signed)
 Physician Discharge Summary   Patient: Aaron Clarke MRN: [id number] DOB: [birth year]  Admit date:     03/11/2024  Discharge date: 03/14/24  Discharge Physician: Justina Oman   PCP: Jenelle Mis, FNP   Recommendations at discharge:  Repeat basic metabolic panel to follow electrolytes and renal function Repeat CBC to follow hemoglobin trend/stability Please assist patient with weight loss management. Reassess blood pressure and adjust antihypertensive regimen as needed.  Discharge Diagnoses: Principal Problem:   Pulmonary embolism (HCC) Active Problems:   HTN (hypertension)   Elevated troponin   Acute kidney injury superimposed on chronic kidney disease (HCC)   Class 2 obesity   COPD (chronic obstructive pulmonary disease) (HCC)   GERD (gastroesophageal reflux disease)  Brief hospital admission narrative: Aaron Clarke is a 68 y.o. male with medical history significant of asthma/COPD, hypertension, osteoarthritis, chronic kidney disease stage IIIa prior history of DVT/PE and history of total hip replacement approximately 3 weeks ago; who presented to the hospital complaining of shortness of breath and dyspnea on exertion.   Patient reports no fever, no sick contacts, no nausea/vomiting, no dysuria, no hematuria, no blurred vision/headache and no focal weaknesses.   Workup in the ED demonstrating acute kidney injury on chronic kidney disease and a VQ scan with intermediate probability for pulmonary embolism.  TR[facility] has been contacted to place patient in the hospital for further evaluation and management.    Assessment and Plan: Pulmonary embolism (HCC) - Most likely provoked in the setting of recent orthopedic surgical intervention - 2D echo has demonstrated no right heart strain; preserved ejection fraction and no significant valvular disorder. - After 48 hours of heparin  drip patient transitioned to Eliquis  to continue anticoagulation therapy at discharge. -  Outpatient follow-up with PCP in 2 weeks. - Continue as needed analgesics. - No oxygen supplementation required.  Acute kidney injury superimposed on chronic kidney disease (HCC) -In the setting of dehydration/prerenal azotemia and continue use of nephrotoxic agents. -Continue to minimize nephrotoxic agents - Urinalysis demonstrated no acute UTI. - Patient advised to maintain adequate hydration - After fluid resuscitation renal function back to baseline. - Chronic kidney disease stage IIIa at baseline.  GERD (gastroesophageal reflux disease) - Continue PPI.   COPD (chronic obstructive pulmonary disease) (HCC) - No acute exacerbation currently appreciated - Continue home bronchodilator regimen. - Flutter valve has been encouraged.   Class 2 obesity -Body mass index is 38.78 kg/m. - Low-calorie diet and portion control discussed with patient.   Elevated troponin - Patient describing pleuritic chest pain at time of admission - Most likely demand ischemia in the setting of pulmonary embolism - No acute ischemic changes appreciated on EKG or telemetry - 2D echo reassuring with preserved ejection fraction and no wall motion abnormalities. - Acute on chronic renal failure also participating in elevated troponin levels. - Continue treatment with bisoprolol . -As mentioned in problem #1 continue treatment for pulmonary embolism.   HTN (hypertension) - Resume home antihypertensive agents - Heart healthy/low-sodium diet discussed with patient.  Consultants: None Procedures performed: See below for x-ray reports. Disposition: Home Diet recommendation: Heart healthy/low calorie diet.  DISCHARGE MEDICATION: Allergies as of 03/14/2024   No Active Allergies      Medication List     STOP taking these medications    aspirin  81 MG chewable tablet   celecoxib  200 MG capsule Commonly known as: CELEBREX        TAKE these medications    albuterol  108 (90 Base) MCG/ACT  inhaler Commonly known as: VENTOLIN   HFA Inhale 1-2 puffs into the lungs every 6 (six) hours as needed for wheezing or shortness of breath.   albuterol  (2.5 MG/3ML) 0.083% nebulizer solution Commonly known as: PROVENTIL  Take 3 mLs (2.5 mg total) by nebulization every 6 (six) hours as needed for wheezing or shortness of breath.   apixaban  5 MG Tabs tablet Commonly known as: ELIQUIS  Take 2 tablets (10 mg total) by mouth 2 (two) times daily for 7 days, THEN 1 tablet (5 mg total) 2 (two) times daily. Start taking on: March 14, 2024   bisoprolol  5 MG tablet Commonly known as: ZEBETA  Take 1 tablet (5 mg total) by mouth daily.   budesonide -formoterol  160-4.5 MCG/ACT inhaler Commonly known as: Symbicort  Take 2 puffs first thing in am and then another 2 puffs about 12 hours later. What changed:  how much to take how to take this when to take this   methocarbamol  500 MG tablet Commonly known as: ROBAXIN  Take 1 tablet (500 mg total) by mouth every 6 (six) hours as needed for muscle spasms.   oxyCODONE  5 MG immediate release tablet Commonly known as: Oxy IR/ROXICODONE  Take 1-2 tablets (5-10 mg total) by mouth every 6 (six) hours as needed for moderate pain (pain score 4-6) (pain score 4-6).   pantoprazole  40 MG tablet Commonly known as: PROTONIX  Take 1 tablet (40 mg total) by mouth daily. Start taking on: March 15, 2024        Follow-up Information     Jenelle Mis, [place]. Schedule an appointment as soon as possible for a visit in 2 week(s).   Specialty: Family Medicine Contact information: [address] [place] [address] [phone]                Discharge Exam: Cleavon Curls Weights   03/11/24 2104 03/12/24 0515  Weight: 129.7 kg 129.7 kg   General exam: Alert, awake, oriented x 3; feeling better and breathing easier.  No oxygen supplementation required; reporting no chest pain and feeling ready to go home. Respiratory system: No using accessory muscles; no  wheezing appreciated on exam.  Good saturation on room air. Cardiovascular system:RRR. No rubs or gallops; no JVD. Gastrointestinal system: Abdomen is obese, nondistended, soft and nontender. No organomegaly or masses felt. Normal bowel sounds heard. Central nervous system: No focal neurological deficits. Extremities: No cyanosis or clubbing. Skin: No petechiae. Psychiatry: Judgement and insight appear normal. Mood & affect appropriate.   Condition at discharge: Stable and improved.  The results of significant diagnostics from this hospitalization (including imaging, microbiology, ancillary and laboratory) are listed below for reference.   Imaging Studies: ECHOCARDIOGRAM COMPLETE Result Date: 03/14/2024    ECHOCARDIOGRAM REPORT   Patient Name:   JONOTHAN HEBERLE Date of Exam: 03/14/2024 Medical Rec #:  [id number]          Height:       72.0 in Accession #:    [id number]         Weight:       285.9 lb Date of Birth:  1956-06-18         BSA:          2.480 m Patient Age:    67 years           BP:           137/82 mmHg Patient Gender: M                  HR:  77 bpm. Exam Location:  [facility] Procedure: 2D Echo, Cardiac Doppler, Color Doppler and Intracardiac            Opacification Agent (Both Spectral and Color Flow Doppler were            utilized during procedure). Indications:    Pulmonary Embolus I26.09  History:        Patient has prior history of Echocardiogram examinations, most                 recent 10/24/2022. COPD; Risk Factors:Hypertension and Sleep                 Apnea. PE (pulmonary thromboembolism) and DVT (deep venous                 thrombosis) (HCC) (From Hx).  Sonographer:    Denese Finn RCS Referring Phys: [id number] Jamese Trauger  Sonographer Comments: Image acquisition challenging due to patient body habitus. IMPRESSIONS  1. Left ventricular ejection fraction, by estimation, is 55 to 60%. The left ventricle has normal function. Left ventricular endocardial border not  optimally defined to evaluate regional wall motion. There is moderate left ventricular hypertrophy. Left ventricular diastolic parameters are indeterminate.  2. Right ventricular systolic function is normal. The right ventricular size is normal. Tricuspid regurgitation signal is inadequate for assessing PA pressure.  3. Left atrial size was moderately dilated.  4. The mitral valve is normal in structure. No evidence of mitral valve regurgitation. No evidence of mitral stenosis.  5. The aortic valve was not well visualized. There is mild calcification of the aortic valve. There is mild thickening of the aortic valve. Aortic valve regurgitation is not visualized. No aortic stenosis is present.  6. The inferior vena cava is normal in size with greater than 50% respiratory variability, suggesting right atrial pressure of 3 mmHg. FINDINGS  Left Ventricle: Left ventricular ejection fraction, by estimation, is 55 to 60%. The left ventricle has normal function. Left ventricular endocardial border not optimally defined to evaluate regional wall motion. Definity  contrast agent was given IV to delineate the left ventricular endocardial borders. The left ventricular internal cavity size was normal in size. There is moderate left ventricular hypertrophy. Left ventricular diastolic parameters are indeterminate. Right Ventricle: The right ventricular size is normal. Right vetricular wall thickness was not well visualized. Right ventricular systolic function is normal. Tricuspid regurgitation signal is inadequate for assessing PA pressure. Left Atrium: Left atrial size was moderately dilated. Right Atrium: Right atrial size was normal in size. Pericardium: There is no evidence of pericardial effusion. Mitral Valve: The mitral valve is normal in structure. No evidence of mitral valve regurgitation. No evidence of mitral valve stenosis. Tricuspid Valve: The tricuspid valve is normal in structure. Tricuspid valve regurgitation is not  demonstrated. No evidence of tricuspid stenosis. Aortic Valve: The aortic valve was not well visualized. There is mild calcification of the aortic valve. There is mild thickening of the aortic valve. There is mild aortic valve annular calcification. Aortic valve regurgitation is not visualized. No aortic stenosis is present. Aortic valve mean gradient measures 5.3 mmHg. Aortic valve peak gradient measures 12.3 mmHg. Aortic valve area, by VTI measures 3.14 cm. Pulmonic Valve: The pulmonic valve was not well visualized. Pulmonic valve regurgitation is not visualized. No evidence of pulmonic stenosis. Aorta: The aortic root is normal in size and structure. Venous: The inferior vena cava is normal in size with greater than 50% respiratory variability, suggesting right atrial pressure of 3  mmHg. IAS/Shunts: No atrial level shunt detected by color flow Doppler.  LEFT VENTRICLE PLAX 2D LVIDd:         5.30 cm   Diastology LVIDs:         3.50 cm   LV e' medial:    9.68 cm/s LV PW:         1.30 cm   LV E/e' medial:  9.9 LV IVS:        1.30 cm   LV e' lateral:   10.90 cm/s LVOT diam:     2.20 cm   LV E/e' lateral: 8.8 LV SV:         108 LV SV Index:   43 LVOT Area:     3.80 cm  RIGHT VENTRICLE RV S prime:     13.50 cm/s TAPSE (M-mode): 2.4 cm LEFT ATRIUM              Index        RIGHT ATRIUM           Index LA diam:        4.60 cm  1.85 cm/m   RA Area:     20.60 cm LA Vol (A2C):   145.0 ml 58.47 ml/m  RA Volume:   59.70 ml  24.07 ml/m LA Vol (A4C):   95.3 ml  38.43 ml/m LA Biplane Vol: 120.0 ml 48.38 ml/m  AORTIC VALVE AV Area (Vmax):    2.67 cm AV Area (Vmean):   2.87 cm AV Area (VTI):     3.14 cm AV Vmax:           175.37 cm/s AV Vmean:          105.674 cm/s AV VTI:            0.342 m AV Peak Grad:      12.3 mmHg AV Mean Grad:      5.3 mmHg LVOT Vmax:         123.00 cm/s LVOT Vmean:        79.800 cm/s LVOT VTI:          0.283 m LVOT/AV VTI ratio: 0.83  AORTA Ao Root diam: 3.70 cm MITRAL VALVE MV Area (PHT): 1.84  cm    SHUNTS MV Decel Time: 412 msec    Systemic VTI:  0.28 m MV E velocity: 96.00 cm/s  Systemic Diam: 2.20 cm MV A velocity: 98.50 cm/s MV E/A ratio:  0.97 Armida Lander MD Electronically signed by Armida Lander MD Signature Date/Time: 03/14/2024/2:03:55 PM    Final    US  RENAL Result Date: 03/12/2024 CLINICAL DATA:  AK I EXAM: RENAL / URINARY TRACT ULTRASOUND COMPLETE COMPARISON:  None Available. FINDINGS: Right Kidney: Renal measurements: 10.8 x 5.3 x 4.1 = volume: 125.08 mL. Simple cyst of 1.8 x 1.8 cm in the upper pole Left Kidney: Renal measurements: 14.4 x 8.04 x 6.04 = volume: 368.4 mL. Simple cyst of 2.8 x 2.5 cm in the lower pole Bladder: Hyperechoic lobular mass in the left posterolateral bladder wall measuring 2.3 x 1.9 cm, correlation with endoscopy recommended. Other: None. IMPRESSION: *Hyperechoic lobular mass in the left posterolateral bladder wall measuring 2.3 x 1.9 cm, correlation with endoscopy recommended. *Bilateral simple renal cysts. Electronically Signed   By: Fredrich Jefferson M.D.   On: 03/12/2024 10:50   NM Pulmonary Perfusion Result Date: 03/12/2024 CLINICAL DATA:  Chest pain and shortness of breath.  PE suspected. EXAM: NUCLEAR MEDICINE PERFUSION LUNG SCAN TECHNIQUE: Perfusion images were  obtained in multiple projections after intravenous injection of radiopharmaceutical. Ventilation scans intentionally deferred if perfusion scan and chest x-ray adequate for interpretation during COVID 19 epidemic. RADIOPHARMACEUTICALS:  4.4 mCi Tc-65m MAA IV COMPARISON:  Chest radiograph 03/11/2024 FINDINGS: Heterogenous perfusion of the lungs likely due to underlying lung parenchymal disease. There is a wedge-shaped perfusion defect in the right upper lobe. IMPRESSION: Intermediate probability for pulmonary embolism. If there is ongoing concern for PE, CTA of the chest is recommended. Electronically Signed   By: Rozell Cornet M.D.   On: 03/12/2024 01:53   DG Chest 2 View Result Date:  03/11/2024 CLINICAL DATA:  Chest pain EXAM: CHEST - 2 VIEW COMPARISON:  Chest x-ray 12/27/2023 again seen FINDINGS: The heart size and mediastinal contours are within normal limits. Are calcified left hilar lymph nodes. Both lungs are clear. The visualized skeletal structures are unremarkable. IMPRESSION: No active cardiopulmonary disease. Electronically Signed   By: Tyron Gallon M.D.   On: 03/11/2024 21:37   DG Pelvis Portable Result Date: 02/19/2024 CLINICAL DATA:  Right hip arthroplasty EXAM: PORTABLE PELVIS 1-2 VIEWS COMPARISON:  10/27/2023 FINDINGS: 2 frontal views of the pelvis including bilateral hips. Portions of the right greater trochanter are excluded by collimation. Right hip arthroplasty is identified in the expected position without evidence of acute complication. Stable mild left hip osteoarthritis. IMPRESSION: 1. Unremarkable right hip arthroplasty. Electronically Signed   By: Bobbye Burrow M.D.   On: 02/19/2024 15:55   DG HIP UNILAT WITH PELVIS 1V RIGHT Result Date: 02/19/2024 CLINICAL DATA:  Right hip arthroplasty EXAM: DG HIP (WITH OR WITHOUT PELVIS) 1V RIGHT COMPARISON:  10/27/2023 FINDINGS: 5 fluoroscopic images are obtained during the performance of the procedure and are provided for interpretation only. Placement of a right hip arthroplasty is identified, in the expected position. Please refer to the operative report. Fluoroscopy time: 21 seconds, 5.8 mGy IMPRESSION: 1. Right hip arthroplasty.  Please refer to the operative report. Electronically Signed   By: Bobbye Burrow M.D.   On: 02/19/2024 15:54   DG C-Arm 1-60 Min-No Report Result Date: 02/19/2024 Fluoroscopy was utilized by the requesting physician.  No radiographic interpretation.    Microbiology: Results for orders placed or performed during the hospital encounter of 03/11/24  MRSA Next Gen by PCR, Nasal     Status: None   Collection Time: 03/12/24  5:20 AM   Specimen: Nasal Mucosa; Nasal Swab  Result Value Ref Range  Status   MRSA by PCR Next Gen NOT DETECTED NOT DETECTED Final    Comment: (NOTE) The GeneXpert MRSA Assay (FDA approved for NASAL specimens only), is one component of a comprehensive MRSA colonization surveillance program. It is not intended to diagnose MRSA infection nor to guide or monitor treatment for MRSA infections. Test performance is not FDA approved in patients less than 5 years old. Performed at [facility], [address] [place], [place] [address]     Labs: CBC: Recent Labs  Lab 03/11/24 2131 03/13/24 0428 03/14/24 0423  WBC 9.8 7.0 6.5  HGB 14.1 11.7* 12.2*  HCT 43.4 35.8* 37.8*  MCV 89.7 90.6 90.6  PLT 254 167 166   Basic Metabolic Panel: Recent Labs  Lab 03/11/24 2131 03/12/24 0829 03/13/24 0428  NA 133*  --  137  K 4.2  --  4.4  CL 102  --  106  CO2 18*  --  22  GLUCOSE 121*  --  108*  BUN 53*  --  31*  CREATININE 2.41*  --  1.55*  CALCIUM 9.2  --  8.6*  MG  --  2.3  --   PHOS  --  4.6  --    Discharge time spent: greater than 30 minutes.  Signed: Justina Oman, MD [facility] 03/14/2024

## 2024-03-14 NOTE — Progress Notes (Signed)
 PHARMACY - ANTICOAGULATION CONSULT NOTE  Pharmacy Consult for Heparin => eliquis  Indication: pulmonary embolus  No Active Allergies   Patient Measurements: Height: 6' (182.9 cm) Weight: 129.7 kg (285 lb 15 oz) IBW/kg (Calculated) : 77.6 HEPARIN  DW (KG): 106.8  Vital Signs: Temp: 98.7 F (37.1 C) (04/25 1207) Temp Source: Oral (04/25 1207) BP: 159/70 (04/25 1207) Pulse Rate: 62 (04/25 1207)  Labs: Recent Labs    03/11/24 2131 03/11/24 2256 03/12/24 0829 03/12/24 1615 03/13/24 0428 03/14/24 0423  HGB 14.1  --   --   --  11.7* 12.2*  HCT 43.4  --   --   --  35.8* 37.8*  PLT 254  --   --   --  167 166  HEPARINUNFRC  --   --    < > 0.33 0.36 0.40  CREATININE 2.41*  --   --   --  1.55*  --   TROPONINIHS 183* 266*  --   --   --   --    < > = values in this interval not displayed.    Estimated Creatinine Clearance: 66.1 mL/min (A) (by C-G formula based on SCr of 1.55 mg/dL (H)).   Medical History: 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   Family history of breast cancer    mother   Hypertension    Osteoarthritis of right hip    PE (pulmonary thromboembolism) (HCC) 10/23/2022    Medications:  No current facility-administered medications on file prior to encounter.   Current Outpatient Medications on File Prior to Encounter  Medication Sig Dispense Refill   albuterol  (PROVENTIL ) (2.5 MG/3ML) 0.083% nebulizer solution Take 3 mLs (2.5 mg total) by nebulization every 6 (six) hours as needed for wheezing or shortness of breath. 75 mL 0   albuterol  (VENTOLIN  HFA) 108 (90 Base) MCG/ACT inhaler Inhale 1-2 puffs into the lungs every 6 (six) hours as needed for wheezing or shortness of breath. 8 g 0   aspirin  81 MG chewable tablet Chew 1 tablet (81 mg total) by mouth 2 (two) times daily. 30 tablet 0   bisoprolol  (ZEBETA ) 5 MG tablet Take 1  tablet (5 mg total) by mouth daily. 30 tablet 11   budesonide -formoterol  (SYMBICORT ) 160-4.5 MCG/ACT inhaler Take 2 puffs first thing in am and then another 2 puffs about 12 hours later. (Patient taking differently: Inhale 2 puffs into the lungs in the morning and at bedtime. Take 2 puffs first thing in am and then another 2 puffs about 12 hours later.) 1 each 11   celecoxib  (CELEBREX ) 200 MG capsule TAKE 1 CAPSULE (200 MG TOTAL) BY MOUTH 2 (TWO) TIMES DAILY BETWEEN MEALS AS NEEDED. (Patient taking differently: Take 200 mg by mouth 2 (two) times daily as needed for mild pain (pain score 1-3).) 60 capsule 1   methocarbamol  (ROBAXIN ) 500 MG tablet Take 1 tablet (500 mg total) by mouth every 6 (six) hours as needed for muscle spasms. 30 tablet 1   oxyCODONE  (OXY IR/ROXICODONE ) 5 MG immediate release tablet Take 1-2 tablets (5-10 mg total) by mouth every 6 (six) hours as needed for moderate pain (pain score 4-6) (pain score 4-6). 30 tablet 0     Assessment: 68 y.o. male presenting with chest pain and recent hip surgery. Troponin is elevated and rising. V/Q scan is intermediate probability for  PE. There is a wedge-shaped perfusion defect in the right upper lobe. PE is the most likely cause for pain and elevated troponin is most likely demand ischemia. Pharmacy has been consulted for heparin  dosing for PE.   Transition to eliquis  for PE  Heparin  level: 0.36> 0.4 - therapeutic  Goal of Therapy:  Heparin  level 0.3-0.7 units/ml Monitor platelets by anticoagulation protocol: Yes   Plan:  D/c Heparin  Eliquis  10mg  po bidx 7 days, then 5mg  po bid Educate on eliquis  Continue to monitor H&H and platelets.   Anette Barra, BS Pharm D, BCPS Clinical Pharmacist 03/14/2024 1:16 PM

## 2024-03-14 NOTE — Progress Notes (Signed)
*  PRELIMINARY RESULTS* Echocardiogram 2D Echocardiogram has been performed with Definity .  Aaron Clarke 03/14/2024, 1:53 PM

## 2024-03-14 NOTE — Telephone Encounter (Signed)

## 2024-03-30 ENCOUNTER — Other Ambulatory Visit: Payer: Self-pay | Admitting: Orthopaedic Surgery

## 2024-03-31 ENCOUNTER — Encounter: Payer: Self-pay | Admitting: Family Medicine

## 2024-03-31 ENCOUNTER — Encounter: Payer: Self-pay | Admitting: Orthopaedic Surgery

## 2024-03-31 ENCOUNTER — Ambulatory Visit (INDEPENDENT_AMBULATORY_CARE_PROVIDER_SITE_OTHER): Admitting: Family Medicine

## 2024-03-31 ENCOUNTER — Ambulatory Visit (INDEPENDENT_AMBULATORY_CARE_PROVIDER_SITE_OTHER): Admitting: Orthopaedic Surgery

## 2024-03-31 VITALS — BP 150/88 | HR 84 | Ht 72.0 in | Wt 292.0 lb

## 2024-03-31 DIAGNOSIS — Z96641 Presence of right artificial hip joint: Secondary | ICD-10-CM

## 2024-03-31 DIAGNOSIS — I10 Essential (primary) hypertension: Secondary | ICD-10-CM

## 2024-03-31 DIAGNOSIS — Z86711 Personal history of pulmonary embolism: Secondary | ICD-10-CM | POA: Insufficient documentation

## 2024-03-31 NOTE — Assessment & Plan Note (Signed)
 Continue Eliquis . No symptoms since hospitalization. Repeat CBC and CMP today. He is working on weight loss and BP control.

## 2024-03-31 NOTE — Assessment & Plan Note (Signed)
 Uncontrolled. Restart Bisoprolol  5mg  daily. Monitor BP at home and return to office in 2 week. CMP and CBC today. Recommend heart healthy diet such as Mediterranean diet with whole grains, fruits, vegetable, fish, lean meats, nuts, and olive oil. Limit salt. Encouraged moderate walking, 3-5 times/week for 30-50 minutes each session. Aim for at least 150 minutes.week. Goal should be pace of 3 miles/hours, or walking 1.5 miles in 30 minutes. Avoid tobacco products. Avoid excess alcohol. Take medications as prescribed and bring medications and blood pressure log with cuff to each office visit. Seek medical care for chest pain, palpitations, shortness of breath with exertion, dizziness/lightheadedness, vision changes, recurrent headaches, or swelling of extremities.

## 2024-03-31 NOTE — Progress Notes (Signed)
 Subjective:  HPI: Aaron Clarke is a 68 y.o. male presenting on 03/31/2024 for No chief complaint on file.   HPI Patient is in today for hospital follow up. He was hospitalized for difficulty breathing from 4/22-4/25/2025 with pulmonary embolism most likely provoked in setting of recent hip replacement. Discharged on Eliquis  and reports compliance with medication. Denies chest pain, shortness of breath, dizziness or lightheadedness, swelling of extremities. Blood pressure was elevated and he was started on Bisoprolol  5mg  daily but he is not taking this. His BP at home has been 127/78 and he checks this every few days. He did just leave the gym today and came straight to this OV.     Review of Systems  All other systems reviewed and are negative.   Relevant past medical history reviewed and updated as indicated.   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   Family history of breast cancer    mother   Hypertension    Osteoarthritis of right hip    PE (pulmonary thromboembolism) (HCC) 10/23/2022     Past Surgical History:  Procedure Laterality Date   CHOLECYSTECTOMY  12/19/2011   Procedure: LAPAROSCOPIC CHOLECYSTECTOMY WITH INTRAOPERATIVE CHOLANGIOGRAM;  Surgeon: Levert Ready, MD;  Location: [facility] OR;  Service: General;  Laterality: N/A;   COLONOSCOPY WITH PROPOFOL  N/A 09/13/2022   Procedure: COLONOSCOPY WITH PROPOFOL ;  Surgeon: Selena Daily, MD;  Location: [facility] ENDOSCOPY;  Service: Gastroenterology;  Laterality: N/A;   Colonscopy     Fatty tumor  2003   L chest   TOTAL HIP ARTHROPLASTY Right 02/19/2024   Procedure: ARTHROPLASTY, HIP, TOTAL, ANTERIOR APPROACH;  Surgeon: Arnie Lao, MD;  Location: [facility] OR;  Service: Orthopedics;  Laterality: Right;    Allergies and medications reviewed and updated.   Current Outpatient  Medications:    albuterol  (PROVENTIL ) (2.5 MG/3ML) 0.083% nebulizer solution, Take 3 mLs (2.5 mg total) by nebulization every 6 (six) hours as needed for wheezing or shortness of breath., Disp: 75 mL, Rfl: 0   albuterol  (VENTOLIN  HFA) 108 (90 Base) MCG/ACT inhaler, Inhale 1-2 puffs into the lungs every 6 (six) hours as needed for wheezing or shortness of breath., Disp: 8 g, Rfl: 0   apixaban  (ELIQUIS ) 5 MG TABS tablet, Take 2 tablets (10 mg total) by mouth 2 (two) times daily for 7 days, THEN 1 tablet (5 mg total) 2 (two) times daily., Disp: 60 tablet, Rfl: 6   bisoprolol  (ZEBETA ) 5 MG tablet, Take 1 tablet (5 mg total) by mouth daily., Disp: 30 tablet, Rfl: 11   budesonide -formoterol  (SYMBICORT ) 160-4.5 MCG/ACT inhaler, Take 2 puffs first thing in am and then another 2 puffs about 12 hours later. (Patient taking differently: Inhale 2 puffs into the lungs in the morning and at bedtime. Take 2 puffs first thing in am and then another 2 puffs about 12 hours later.), Disp: 1 each, Rfl: 11   celecoxib  (CELEBREX ) 200 MG capsule, TAKE 1 CAPSULE (200 MG TOTAL) BY MOUTH 2 (TWO) TIMES DAILY BETWEEN MEALS AS NEEDED., Disp: 60 capsule, Rfl: 1   methocarbamol  (ROBAXIN ) 500 MG tablet, Take 1 tablet (500 mg total) by mouth every 6 (six) hours as needed for muscle spasms., Disp: 30 tablet, Rfl: 1   oxyCODONE  (OXY IR/ROXICODONE ) 5 MG immediate release tablet, Take 1-2 tablets (5-10 mg total) by  mouth every 6 (six) hours as needed for moderate pain (pain score 4-6) (pain score 4-6)., Disp: 30 tablet, Rfl: 0   pantoprazole  (PROTONIX ) 40 MG tablet, Take 1 tablet (40 mg total) by mouth daily., Disp: 30 tablet, Rfl: 1  No Active Allergies  Objective:   BP (!) 150/88 (BP Location: Left Arm, Patient Position: Sitting, Cuff Size: Normal)   Pulse 84   Ht 6' (1.829 m)   Wt 292 lb (132.5 kg)   SpO2 97%   BMI 39.60 kg/m      03/31/2024    3:46 PM 03/31/2024    3:33 PM 03/14/2024    2:00 PM  Vitals with BMI  Height  6'  0"   Weight  292 lbs   BMI  39.59   Systolic 150 176 409  Diastolic 88 92 85  Pulse  84 62     Physical Exam Vitals and nursing note reviewed.  Constitutional:      Appearance: Normal appearance. He is obese.  HENT:     Head: Normocephalic and atraumatic.  Cardiovascular:     Rate and Rhythm: Normal rate and regular rhythm.     Pulses: Normal pulses.     Heart sounds: Normal heart sounds.  Pulmonary:     Effort: Pulmonary effort is normal.     Breath sounds: Normal breath sounds.  Skin:    General: Skin is warm and dry.     Capillary Refill: Capillary refill takes less than 2 seconds.  Neurological:     General: No focal deficit present.     Mental Status: He is alert and oriented to person, place, and time. Mental status is at baseline.  Psychiatric:        Mood and Affect: Mood normal.        Behavior: Behavior normal.        Thought Content: Thought content normal.        Judgment: Judgment normal.     Assessment & Plan:  Primary hypertension Assessment & Plan: Uncontrolled. Restart Bisoprolol  5mg  daily. Monitor BP at home and return to office in 2 week. CMP and CBC today. Recommend heart healthy diet such as Mediterranean diet with whole grains, fruits, vegetable, fish, lean meats, nuts, and olive oil. Limit salt. Encouraged moderate walking, 3-5 times/week for 30-50 minutes each session. Aim for at least 150 minutes.week. Goal should be pace of 3 miles/hours, or walking 1.5 miles in 30 minutes. Avoid tobacco products. Avoid excess alcohol. Take medications as prescribed and bring medications and blood pressure log with cuff to each office visit. Seek medical care for chest pain, palpitations, shortness of breath with exertion, dizziness/lightheadedness, vision changes, recurrent headaches, or swelling of extremities.   Orders: -     CBC with Differential/Platelet -     Comprehensive metabolic panel with GFR  History of pulmonary embolism Assessment &  Plan: Continue Eliquis . No symptoms since hospitalization. Repeat CBC and CMP today. He is working on weight loss and BP control.       Follow up plan: Return in about 2 weeks (around 04/14/2024).  Jenelle Mis, FNP

## 2024-03-31 NOTE — Progress Notes (Signed)
 The patient is 6 weeks status post a right total hip arthroplasty and is of the right hip is doing well and he is only taking Tylenol  for pain.  He has someone who is morbidly obese.  I did see that a few weeks ago he was readmitted to the hospital which was over 3 weeks out from the surgery and he had shortness of breath and ended up having a pulmonary embolus.  He is on blood thinning medication now and he states that they will have him on them for 6 months to a year.  At his postoperative visit he had been compliant with a baby aspirin  twice daily and have been mobile and doing well.  He says the hip is doing better overall.  He still has a harder time putting his shoes and socks on.  He is someone is morbidly obese and understands this will take time.  I am fine with him getting back into a gym.  His right hip moves more smoothly and fluidly.  There is some stiffness to be expected.  His right hip incision looks good.  From my standpoint he will continue to slowly increase his activities as comfort allows.  Will see him back in 3 months with a standing AP pelvis and lateral of his right operative hip.

## 2024-04-01 ENCOUNTER — Ambulatory Visit: Payer: Self-pay | Admitting: Family Medicine

## 2024-04-01 LAB — COMPREHENSIVE METABOLIC PANEL WITH GFR
AG Ratio: 1.4 (calc) (ref 1.0–2.5)
ALT: 14 U/L (ref 9–46)
AST: 14 U/L (ref 10–35)
Albumin: 4.1 g/dL (ref 3.6–5.1)
Alkaline phosphatase (APISO): 68 U/L (ref 35–144)
BUN/Creatinine Ratio: 18 (calc) (ref 6–22)
BUN: 26 mg/dL — ABNORMAL HIGH (ref 7–25)
CO2: 22 mmol/L (ref 20–32)
Calcium: 9.8 mg/dL (ref 8.6–10.3)
Chloride: 108 mmol/L (ref 98–110)
Creat: 1.46 mg/dL — ABNORMAL HIGH (ref 0.70–1.35)
Globulin: 2.9 g/dL (ref 1.9–3.7)
Glucose, Bld: 100 mg/dL — ABNORMAL HIGH (ref 65–99)
Potassium: 3.9 mmol/L (ref 3.5–5.3)
Sodium: 142 mmol/L (ref 135–146)
Total Bilirubin: 0.5 mg/dL (ref 0.2–1.2)
Total Protein: 7 g/dL (ref 6.1–8.1)
eGFR: 53 mL/min/{1.73_m2} — ABNORMAL LOW (ref >=60)

## 2024-04-01 LAB — CBC WITH DIFFERENTIAL/PLATELET
Absolute Lymphocytes: 1235 {cells}/uL (ref 850–3900)
Absolute Monocytes: 476 {cells}/uL (ref 200–950)
Basophils Absolute: 48 {cells}/uL (ref 0–200)
Basophils Relative: 0.7 %
Eosinophils Absolute: 179 {cells}/uL (ref 15–500)
Eosinophils Relative: 2.6 %
HCT: 41.7 % (ref 38.5–50.0)
Hemoglobin: 13.9 g/dL (ref 13.2–17.1)
MCH: 29.4 pg (ref 27.0–33.0)
MCHC: 33.3 g/dL (ref 32.0–36.0)
MCV: 88.2 fL (ref 80.0–100.0)
MPV: 12 fL (ref 7.5–12.5)
Monocytes Relative: 6.9 %
Neutro Abs: 4961 {cells}/uL (ref 1500–7800)
Neutrophils Relative %: 71.9 %
Platelets: 193 10*3/uL (ref 140–400)
RBC: 4.73 10*6/uL (ref 4.20–5.80)
RDW: 13.4 % (ref 11.0–15.0)
Total Lymphocyte: 17.9 %
WBC: 6.9 10*3/uL (ref 3.8–10.8)

## 2024-04-22 ENCOUNTER — Ambulatory Visit: Admitting: Family Medicine

## 2024-05-07 ENCOUNTER — Other Ambulatory Visit: Payer: Self-pay | Admitting: Family Medicine

## 2024-05-07 ENCOUNTER — Ambulatory Visit: Admitting: Family Medicine

## 2024-05-07 ENCOUNTER — Encounter: Payer: Self-pay | Admitting: Family Medicine

## 2024-05-07 VITALS — BP 116/68 | HR 69 | Temp 97.8°F | Ht 72.0 in | Wt 303.8 lb

## 2024-05-07 DIAGNOSIS — N1831 Chronic kidney disease, stage 3a: Secondary | ICD-10-CM | POA: Diagnosis not present

## 2024-05-07 DIAGNOSIS — Z6841 Body Mass Index (BMI) 40.0 and over, adult: Secondary | ICD-10-CM | POA: Diagnosis not present

## 2024-05-07 DIAGNOSIS — I10 Essential (primary) hypertension: Secondary | ICD-10-CM

## 2024-05-07 MED ORDER — ZEPBOUND 2.5 MG/0.5ML ~~LOC~~ SOAJ: 2.5000 mg | SUBCUTANEOUS | 0 refills | Status: DC

## 2024-05-07 NOTE — Progress Notes (Signed)
 Subjective:  HPI: Aaron Clarke is a 68 y.o. male presenting on 05/07/2024 for Medical Management of Chronic Issues (2 wk f/u /Wt . Loss concerns )   HPI Patient is in today for HTN follow up. Has restarted Bisoprolol  5mg  daily without side effects. Home readings have been around 120/78. Denies chest pain, palpitations, recurrent headaches, vision changes, lightheadedness, dizziness, dyspnea on exertion, or swelling of extremities. Would like to work on weight loss, this would help with preventing having left hip replacement.  Exercise is limited but he does go to gym and does 30 minutes on machines at gym 3 days weekly including cardio. He reports he does consume a healthy diet of mostly steak with limited carbohydrates. No personal history pancreatitis, personal or family history MEN2 or MTC.  Review of Systems  All other systems reviewed and are negative.   Relevant past medical history reviewed and updated as indicated.   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   Family history of breast cancer    mother   Hypertension    Osteoarthritis of right hip    PE (pulmonary thromboembolism) (HCC) 10/23/2022     Past Surgical History:  Procedure Laterality Date   CHOLECYSTECTOMY  12/19/2011   Procedure: LAPAROSCOPIC CHOLECYSTECTOMY WITH INTRAOPERATIVE CHOLANGIOGRAM;  Surgeon: Elon CHRISTELLA Pacini, MD;  Location: [facility] OR;  Service: General;  Laterality: N/A;   COLONOSCOPY WITH PROPOFOL  N/A 09/13/2022   Procedure: COLONOSCOPY WITH PROPOFOL ;  Surgeon: Unk Corinn Skiff, MD;  Location: [facility] ENDOSCOPY;  Service: Gastroenterology;  Laterality: N/A;   Colonscopy     Fatty tumor  2003   L chest   TOTAL HIP ARTHROPLASTY Right 02/19/2024   Procedure: ARTHROPLASTY, HIP, TOTAL, ANTERIOR APPROACH;  Surgeon: Vernetta Lonni GRADE, MD;  Location: [facility] OR;   Service: Orthopedics;  Laterality: Right;    Allergies and medications reviewed and updated.   Current Outpatient Medications:    albuterol  (PROVENTIL ) (2.5 MG/3ML) 0.083% nebulizer solution, Take 3 mLs (2.5 mg total) by nebulization every 6 (six) hours as needed for wheezing or shortness of breath., Disp: 75 mL, Rfl: 0   albuterol  (VENTOLIN  HFA) 108 (90 Base) MCG/ACT inhaler, Inhale 1-2 puffs into the lungs every 6 (six) hours as needed for wheezing or shortness of breath., Disp: 8 g, Rfl: 0   apixaban  (ELIQUIS ) 5 MG TABS tablet, Take 2 tablets (10 mg total) by mouth 2 (two) times daily for 7 days, THEN 1 tablet (5 mg total) 2 (two) times daily., Disp: 60 tablet, Rfl: 6   bisoprolol  (ZEBETA ) 5 MG tablet, Take 1 tablet (5 mg total) by mouth daily., Disp: 30 tablet, Rfl: 11   budesonide -formoterol  (SYMBICORT ) 160-4.5 MCG/ACT inhaler, Take 2 puffs first thing in am and then another 2 puffs about 12 hours later. (Patient taking differently: Inhale 2 puffs into the lungs in the morning and at bedtime. Take 2 puffs first thing in am and then another 2 puffs about 12 hours later.), Disp: 1 each, Rfl: 11   celecoxib  (CELEBREX ) 200 MG capsule, TAKE 1 CAPSULE (200 MG TOTAL) BY MOUTH 2 (TWO) TIMES DAILY BETWEEN MEALS AS NEEDED., Disp: 60 capsule, Rfl: 1   methocarbamol  (ROBAXIN ) 500 MG tablet, Take 1 tablet (500 mg total) by mouth every 6 (six) hours as needed for muscle spasms., Disp: 30 tablet, Rfl: 1  pantoprazole  (PROTONIX ) 40 MG tablet, Take 1 tablet (40 mg total) by mouth daily., Disp: 30 tablet, Rfl: 1   tirzepatide  (ZEPBOUND ) 2.5 MG/0.5ML Pen, Inject 2.5 mg into the skin once a week., Disp: 2 mL, Rfl: 0   oxyCODONE  (OXY IR/ROXICODONE ) 5 MG immediate release tablet, Take 1-2 tablets (5-10 mg total) by mouth every 6 (six) hours as needed for moderate pain (pain score 4-6) (pain score 4-6). (Patient not taking: Reported on 05/07/2024), Disp: 30 tablet, Rfl: 0  No Active Allergies  Objective:   BP  116/68   Pulse 69   Temp 97.8 F (36.6 C)   Ht 6' (1.829 m)   Wt (!) 303 lb 12.8 oz (137.8 kg)   SpO2 97%   BMI 41.20 kg/m      05/07/2024    3:55 PM 03/31/2024    3:46 PM 03/31/2024    3:33 PM  Vitals with BMI  Height 6' 0  6' 0  Weight 303 lbs 13 oz  292 lbs  BMI 41.19  39.59  Systolic 116 150 823  Diastolic 68 88 92  Pulse 69  84     Physical Exam Vitals and nursing note reviewed.  Constitutional:      Appearance: Normal appearance. He is normal weight.  HENT:     Head: Normocephalic and atraumatic.   Cardiovascular:     Rate and Rhythm: Normal rate and regular rhythm.     Pulses: Normal pulses.     Heart sounds: Normal heart sounds.  Pulmonary:     Effort: Pulmonary effort is normal.     Breath sounds: Normal breath sounds.   Skin:    General: Skin is warm and dry.     Capillary Refill: Capillary refill takes less than 2 seconds.   Neurological:     General: No focal deficit present.     Mental Status: He is alert and oriented to person, place, and time. Mental status is at baseline.   Psychiatric:        Mood and Affect: Mood normal.        Behavior: Behavior normal.        Thought Content: Thought content normal.        Judgment: Judgment normal.     Assessment & Plan:  Primary hypertension Assessment & Plan: Well controlled, continue Bisoprolol  5mg  daily. Recommend heart healthy diet such as Mediterranean diet with whole grains, fruits, vegetable, fish, lean meats, nuts, and olive oil. Limit salt. Encouraged moderate walking, 3-5 times/week for 30-50 minutes each session. Aim for at least 150 minutes.week. Goal should be pace of 3 miles/hours, or walking 1.5 miles in 30 minutes. Avoid tobacco products. Avoid excess alcohol. Take medications as prescribed and bring medications and blood pressure log with cuff to each office visit. Seek medical care for chest pain, palpitations, shortness of breath with exertion, dizziness/lightheadedness, vision changes,  recurrent headaches, or swelling of extremities. Follow up in 4 weeks for weight   Morbid obesity (HCC) Assessment & Plan: Slowly increasing exercise regimen, eating low calorie diet. BMI 41.2 today. Would like medication to help with weight. Start Zepbound  2.5mg  weekly. PMH includes hypertension requiring medication management. Encouraged low calorie, heart healthy diet and moderate intensity exercise 150 minutes weekly. This is 3-5 times weekly for 30-50 minutes each session. Goal should be pace of 3 miles/hours, or walking 1.5 miles in 30 minutes and include strength training. No known history of pancreatitis and personal or family history of MEN2 or MTC Follow  up in 4 weeks or sooner if needed.    Stage 3a chronic kidney disease (HCC) Assessment & Plan: Avoid NSAIDs, stay hydrated   Other orders -     Zepbound ; Inject 2.5 mg into the skin once a week.  Dispense: 2 mL; Refill: 0     Follow up plan: Return in about 4 weeks (around 06/04/2024) for weight management.  Jeoffrey GORMAN Barrio, FNP

## 2024-05-08 ENCOUNTER — Encounter: Payer: Self-pay | Admitting: Family Medicine

## 2024-05-08 ENCOUNTER — Other Ambulatory Visit: Payer: Self-pay | Admitting: Family Medicine

## 2024-05-08 DIAGNOSIS — N1831 Chronic kidney disease, stage 3a: Secondary | ICD-10-CM | POA: Insufficient documentation

## 2024-05-08 DIAGNOSIS — N183 Chronic kidney disease, stage 3 unspecified: Secondary | ICD-10-CM | POA: Insufficient documentation

## 2024-05-08 MED ORDER — WEGOVY 0.25 MG/0.5ML ~~LOC~~ SOAJ: 0.2500 mg | SUBCUTANEOUS | 0 refills | Status: DC

## 2024-05-08 NOTE — Assessment & Plan Note (Signed)
 Well controlled, continue Bisoprolol  5mg  daily. Recommend heart healthy diet such as Mediterranean diet with whole grains, fruits, vegetable, fish, lean meats, nuts, and olive oil. Limit salt. Encouraged moderate walking, 3-5 times/week for 30-50 minutes each session. Aim for at least 150 minutes.week. Goal should be pace of 3 miles/hours, or walking 1.5 miles in 30 minutes. Avoid tobacco products. Avoid excess alcohol. Take medications as prescribed and bring medications and blood pressure log with cuff to each office visit. Seek medical care for chest pain, palpitations, shortness of breath with exertion, dizziness/lightheadedness, vision changes, recurrent headaches, or swelling of extremities. Follow up in 4 weeks for weight

## 2024-05-08 NOTE — Assessment & Plan Note (Signed)
 Slowly increasing exercise regimen, eating low calorie diet. BMI 41.2 today. Would like medication to help with weight. Start Zepbound 2.5mg  weekly. PMH includes hypertension requiring medication management. Encouraged low calorie, heart healthy diet and moderate intensity exercise 150 minutes weekly. This is 3-5 times weekly for 30-50 minutes each session. Goal should be pace of 3 miles/hours, or walking 1.5 miles in 30 minutes and include strength training. No known history of pancreatitis and personal or family history of MEN2 or MTC Follow up in 4 weeks or sooner if needed.

## 2024-05-08 NOTE — Assessment & Plan Note (Signed)
Avoid NSAIDs, stay hydrated 

## 2024-05-09 ENCOUNTER — Telehealth: Payer: Self-pay

## 2024-05-09 ENCOUNTER — Other Ambulatory Visit (HOSPITAL_COMMUNITY): Payer: Self-pay

## 2024-05-09 NOTE — Telephone Encounter (Signed)
 Pharmacy Patient Advocate Encounter   Received notification from CoverMyMeds that prior authorization for [facility] 0.25MG /0.5ML auto-injectors is required/requested.   Insurance verification completed.   The patient is insured through [facility] .   Per test claim: PA required; PA started via CoverMyMeds. KEY [id number] . Waiting for clinical questions to populate.

## 2024-05-12 NOTE — Telephone Encounter (Signed)
 PLEASE BE ADVISED Clinical questions have been answered and PA submitted.TO PLAN. PA currently Pending.

## 2024-05-13 NOTE — Telephone Encounter (Signed)
 Pharmacy Patient Advocate Encounter  Received notification from [employer] that Prior Authorization for [employer]govy  0.25MG /0.5ML auto-injectors  has been DENIED.  See denial reason below. No denial letter attached in CMM. Will attach denial letter to Media tab once received. Message from Plan Request Reference Number: [id number]. WEGOVY  INJ 0.25MG  is DENIED DUE TO PLAN EXCLUSION.  PA #/Case ID/Reference #: [id number].

## 2024-05-25 ENCOUNTER — Emergency Department (HOSPITAL_COMMUNITY)

## 2024-05-25 ENCOUNTER — Other Ambulatory Visit: Payer: Self-pay

## 2024-05-25 ENCOUNTER — Encounter (HOSPITAL_COMMUNITY): Payer: Self-pay | Admitting: *Deleted

## 2024-05-25 ENCOUNTER — Emergency Department (HOSPITAL_COMMUNITY)
Admission: EM | Admit: 2024-05-25 | Discharge: 2024-05-25 | Disposition: A | Discharge status: Home / Self Care | Attending: Emergency Medicine | Admitting: Emergency Medicine

## 2024-05-25 DIAGNOSIS — R0789 Other chest pain: Secondary | ICD-10-CM | POA: Diagnosis not present

## 2024-05-25 DIAGNOSIS — Z7901 Long term (current) use of anticoagulants: Secondary | ICD-10-CM | POA: Diagnosis not present

## 2024-05-25 DIAGNOSIS — R079 Chest pain, unspecified: Secondary | ICD-10-CM | POA: Diagnosis present

## 2024-05-25 LAB — CBC
HCT: 44.4 % (ref 39.0–52.0)
Hemoglobin: 14.6 g/dL (ref 13.0–17.0)
MCH: 29.7 pg (ref 26.0–34.0)
MCHC: 32.9 g/dL (ref 30.0–36.0)
MCV: 90.2 fL (ref 80.0–100.0)
Platelets: 176 K/uL (ref 150–400)
RBC: 4.92 MIL/uL (ref 4.22–5.81)
RDW: 14.6 % (ref 11.5–15.5)
WBC: 8.5 K/uL (ref 4.0–10.5)
nRBC: 0 % (ref 0.0–0.2)

## 2024-05-25 LAB — BASIC METABOLIC PANEL WITH GFR
Anion gap: 11 (ref 5–15)
BUN: 29 mg/dL — ABNORMAL HIGH (ref 8–23)
CO2: 22 mmol/L (ref 22–32)
Calcium: 9.1 mg/dL (ref 8.9–10.3)
Chloride: 108 mmol/L (ref 98–111)
Creatinine, Ser: 1.83 mg/dL — ABNORMAL HIGH (ref 0.61–1.24)
GFR, Estimated: 40 mL/min — ABNORMAL LOW (ref >60)
Glucose, Bld: 123 mg/dL — ABNORMAL HIGH (ref 70–99)
Potassium: 4.3 mmol/L (ref 3.5–5.1)
Sodium: 141 mmol/L (ref 135–145)

## 2024-05-25 LAB — TROPONIN I (HIGH SENSITIVITY)
Troponin I (High Sensitivity): 4 ng/L (ref <18)
Troponin I (High Sensitivity): 4 ng/L (ref <18)

## 2024-05-25 MED ORDER — IOHEXOL 350 MG/ML SOLN
60.0000 mL | Freq: Once | INTRAVENOUS | Status: AC | PRN
  Administered 2024-05-25: 60 mL via INTRAVENOUS

## 2024-05-25 MED ORDER — ASPIRIN 81 MG PO CHEW
324.0000 mg | CHEWABLE_TABLET | Freq: Once | ORAL | Status: AC
  Administered 2024-05-25: 324 mg via ORAL
  Filled 2024-05-25: qty 4

## 2024-05-25 MED ORDER — SODIUM CHLORIDE 0.9 % IV BOLUS
1000.0000 mL | Freq: Once | INTRAVENOUS | Status: AC
  Administered 2024-05-25: 1000 mL via INTRAVENOUS

## 2024-05-25 NOTE — Discharge Instructions (Signed)
 Please follow-up closely with your primary care doctor and cardiology on an outpatient basis.  Return to emergency department immediately for any new or worsening symptoms.

## 2024-05-25 NOTE — ED Triage Notes (Signed)
 Pt with right sided CP for 2 weeks. Pt with hx of PE's

## 2024-05-25 NOTE — ED Notes (Signed)
 See triage  notes. Sig other at bedside pt grabbed his chest and turned purple when cp hit today pta. Pt arrived a/o. Non diaphoretic. C/o some shob at times. No obvious shob noted.

## 2024-05-25 NOTE — ED Notes (Signed)
Pt taken to ct. nad 

## 2024-05-25 NOTE — ED Provider Notes (Signed)
 [place] EMERGENCY DEPARTMENT AT [facility] Provider Note   CSN: [id number] Arrival date & time: 05/25/24  1233     Patient presents with: Chest Pain   Aaron Clarke is a 68 y.o. male.   Patient is a 68 year old male with a past medical history of multiple pulmonary emboli who presents to the emergency department the chief complaint of chest pain which has been ongoing for approximate the past 2 weeks.  The pain has been substernal in nature.  He notes that the pain is worse with exertion and improves with rest.  Patient notes he did have worsening of his pain as well as shortness of breath earlier today which prompted his visit to the emergency department.  He denies any other known history of cardiac disease and has never been seen by cardiologist previously.  He denies any associated lower extremity pain but does admit to some intermittent edema to his ankles.  He denies any abdominal pain, nausea, vomiting, diarrhea.  He has had no recent falls or blunt chest wall trauma.  He is currently on Eliquis  and has been compliant with this.   Chest Pain      Prior to Admission medications   Medication Sig Start Date End Date Taking? Authorizing Provider  albuterol  (PROVENTIL ) (2.5 MG/3ML) 0.083% nebulizer solution Take 3 mLs (2.5 mg total) by nebulization every 6 (six) hours as needed for wheezing or shortness of breath. 12/27/23   Patsey Lot, MD  albuterol  (VENTOLIN  HFA) 108 [phone] Base) MCG/ACT inhaler Inhale 1-2 puffs into the lungs every 6 (six) hours as needed for wheezing or shortness of breath. 08/20/22   Billy Asberry FALCON, PA-C  apixaban  (ELIQUIS ) 5 MG TABS tablet Take 2 tablets (10 mg total) by mouth 2 (two) times daily for 7 days, THEN 1 tablet (5 mg total) 2 (two) times daily. 03/14/24 09/17/24  Ricky Fines, MD  bisoprolol  (ZEBETA ) 5 MG tablet Take 1 tablet (5 mg total) by mouth daily. 01/02/24   Darlean Ozell NOVAK, MD  budesonide -formoterol  (SYMBICORT ) 160-4.5  MCG/ACT inhaler Take 2 puffs first thing in am and then another 2 puffs about 12 hours later. Patient taking differently: Inhale 2 puffs into the lungs in the morning and at bedtime. Take 2 puffs first thing in am and then another 2 puffs about 12 hours later. 01/02/24   Darlean Ozell NOVAK, MD  celecoxib  (CELEBREX ) 200 MG capsule TAKE 1 CAPSULE (200 MG TOTAL) BY MOUTH 2 (TWO) TIMES DAILY BETWEEN MEALS AS NEEDED. 03/31/24   Vernetta Lonni GRADE, MD  methocarbamol  (ROBAXIN ) 500 MG tablet Take 1 tablet (500 mg total) by mouth every 6 (six) hours as needed for muscle spasms. 02/21/24   Vernetta Lonni GRADE, MD  oxyCODONE  (OXY IR/ROXICODONE ) 5 MG immediate release tablet Take 1-2 tablets (5-10 mg total) by mouth every 6 (six) hours as needed for moderate pain (pain score 4-6) (pain score 4-6). Patient not taking: Reported on 05/07/2024 03/03/24   Vernetta Lonni GRADE, MD  pantoprazole  (PROTONIX ) 40 MG tablet Take 1 tablet (40 mg total) by mouth daily. 03/15/24   Ricky Fines, MD  WEGOVY  0.25 MG/0.5ML SOAJ Inject 0.25 mg into the skin once a week. Use this dose for 1 month (4 shots) and then increase to next higher dose. 05/08/24   Kayla Jeoffrey RAMAN, FNP    Allergies: Patient has no known allergies.    Review of Systems  Cardiovascular:  Positive for chest pain.  All other systems reviewed and are negative.  Updated Vital Signs BP 132/83   Pulse 71   Temp 98 F (36.7 C) (Oral)   Resp 18   Ht 6' (1.829 m)   Wt 127 kg   SpO2 95%   BMI 37.97 kg/m   Physical Exam Vitals and nursing note reviewed.  Constitutional:      Appearance: Normal appearance.  HENT:     Head: Normocephalic and atraumatic.     Nose: Nose normal.     Mouth/Throat:     Mouth: Mucous membranes are moist.  Eyes:     Extraocular Movements: Extraocular movements intact.     Conjunctiva/sclera: Conjunctivae normal.     Pupils: Pupils are equal, round, and reactive to light.  Cardiovascular:     Rate and Rhythm: Normal  rate and regular rhythm.     Pulses: Normal pulses.     Heart sounds: Normal heart sounds. Heart sounds not distant. No murmur heard. Pulmonary:     Effort: Pulmonary effort is normal. No tachypnea or respiratory distress.     Breath sounds: Normal breath sounds. No stridor. No decreased breath sounds, wheezing, rhonchi or rales.  Abdominal:     General: Abdomen is flat. Bowel sounds are normal.     Palpations: Abdomen is soft. There is no mass.     Tenderness: There is no abdominal tenderness. There is no guarding.  Musculoskeletal:        General: Normal range of motion.     Cervical back: Normal range of motion and neck supple.     Right lower leg: No edema.     Left lower leg: No edema.  Skin:    General: Skin is warm and dry.  Neurological:     General: No focal deficit present.     Mental Status: He is alert and oriented to person, place, and time. Mental status is at baseline.  Psychiatric:        Mood and Affect: Mood normal.        Behavior: Behavior normal.        Thought Content: Thought content normal.        Judgment: Judgment normal.     (all labs ordered are listed, but only abnormal results are displayed) Labs Reviewed  CBC  BASIC METABOLIC PANEL WITH GFR  TROPONIN I (HIGH SENSITIVITY)    EKG: None  Radiology: DG Chest Portable 1 View Result Date: 05/25/2024 CLINICAL DATA:  RIGHT-sided chest pain EXAM: PORTABLE CHEST 1 VIEW COMPARISON:  03/11/2024 FINDINGS: Lordotic view. Normal mediastinum and cardiac silhouette. Normal pulmonary vasculature. No evidence of effusion, infiltrate, or pneumothorax. No acute bony abnormality. IMPRESSION: No acute cardiopulmonary process. Electronically Signed   By: Jackquline Boxer M.D.   On: 05/25/2024 13:03     Procedures   Medications Ordered in the ED  aspirin  chewable tablet 324 mg (324 mg Oral Given 05/25/24 1304)                                    Medical Decision Making Amount and/or Complexity of Data  Reviewed Labs: ordered. Radiology: ordered.  Risk OTC drugs. Prescription drug management.   This patient presents to the ED for concern of chest pain differential diagnosis includes ACS, pulmonary embolus, pericarditis, myocarditis, endocarditis, pneumonia, pneumothorax, hemothorax, aortic aneurysm or dissection, costochondritis, pleurisy    Additional history obtained:  Additional history obtained from family External records from outside source obtained and reviewed including  medical records   Lab Tests:  I Ordered, and personally interpreted labs.  The pertinent results include: No leukocytosis, no anemia, creatinine at baseline, normal electrolytes, negative serial troponins   Imaging Studies ordered:  I ordered imaging studies including CTA chest, chest x-ray I independently visualized and interpreted imaging which showed no pulmonary embolus, no acute cardiopulmonary process I agree with the radiologist interpretation   Medicines ordered and prescription drug management:  I ordered medication including IV fluids for elevated creatinine Reevaluation of the patient after these medicines showed that the patient improved I have reviewed the patients home medicines and have made adjustments as needed   Problem List / ED Course:  Patient is doing well at this time and is stable for discharge home.  Discussed with patient that all workup in the emergency department has been unremarkable.  EKG demonstrated no acute ischemic changes and patient had negative serial troponins.  Do not suspect acute ACS at this time.  CT of the chest demonstrated no pulmonary embolus no other acute process.  Symptoms are not positional in nature do not suspect pericarditis or myocarditis.  Symptoms may be secondary to costochondritis versus pleurisy.  Did discuss patient case with Dr. Barnetta with cardiology who did recommend outpatient follow-up with cardiology as long as serial troponins were  negative.  Do suspect this is a reasonable plan at this time.  Discussed with patient and significant other about the importance of close follow-up with cardiology on an outpatient basis.  Strict return precautions were provided as well for any new or worsening symptoms.  Patient voiced understanding to the plan and had no additional questions.   Social Determinants of Health:  None        Final diagnoses:  None    ED Discharge Orders     None          Daralene Lonni JONETTA DEVONNA 05/25/24 1547    Suzette Pac, MD 05/25/24 1715

## 2024-05-27 ENCOUNTER — Other Ambulatory Visit: Payer: Self-pay

## 2024-05-27 ENCOUNTER — Other Ambulatory Visit: Payer: Self-pay | Admitting: Family Medicine

## 2024-05-27 MED ORDER — TIRZEPATIDE-WEIGHT MANAGEMENT 2.5 MG/0.5ML ~~LOC~~ SOLN: 2.5000 mg | SUBCUTANEOUS | 0 refills | Status: DC

## 2024-05-28 NOTE — Telephone Encounter (Signed)
 Request for 4 week f/u sent to scheduling team.

## 2024-05-28 NOTE — Telephone Encounter (Signed)
 Outbound calls placed to patient's wife and both daughters to request call back from patient to schedule appointment for after August 20th as per provider's request. All of their mail boxes are full; no option for sending sms message.

## 2024-05-31 ENCOUNTER — Other Ambulatory Visit: Payer: Self-pay | Admitting: Orthopaedic Surgery

## 2024-06-03 ENCOUNTER — Ambulatory Visit (INDEPENDENT_AMBULATORY_CARE_PROVIDER_SITE_OTHER): Admitting: Internal Medicine

## 2024-06-03 ENCOUNTER — Encounter: Payer: Self-pay | Admitting: Internal Medicine

## 2024-06-03 VITALS — BP 154/90 | HR 66 | Ht 72.0 in | Wt 314.0 lb

## 2024-06-03 DIAGNOSIS — J4489 Other specified chronic obstructive pulmonary disease: Secondary | ICD-10-CM | POA: Diagnosis not present

## 2024-06-03 DIAGNOSIS — I10 Essential (primary) hypertension: Secondary | ICD-10-CM

## 2024-06-03 MED ORDER — VALSARTAN-HYDROCHLOROTHIAZIDE 160-12.5 MG PO TABS: 1.0000 | ORAL_TABLET | Freq: Every day | ORAL | 0 refills | Status: DC

## 2024-06-03 NOTE — Patient Instructions (Addendum)
 Resume the valsartan  160-12.5 mg one daily and discuss options for future doses with Jeoffrey Barrio, your PCP    If you are satisfied with your treatment plan,  let your doctor know and he/she can either refill your medications or you can return here when your prescription runs out.     If in any way you are not 100% satisfied,  please tell us .  If 100% better, tell your friends!  Pulmonary follow up is as needed

## 2024-06-03 NOTE — Assessment & Plan Note (Signed)
 Onset Sept 2024 s/p smoking cessation 1989/MM  - 01/02/2024  After extensive coaching inhaler device,  effectiveness =   60% (short Ti)  - Allergy screen 01/02/2024 >  Eos 0.3   IgE  183 -  Alpha one AT phenotype 01/02/2024 >>>  MM  173  - 01/16/2024  After extensive coaching inhaler device,  effectiveness =    80%with hfa > continue symbicort  160 2bid  - 02/27/2024 changed to symbicort  generic or breyna  160 2bid   All goals of chronic asthma control met including optimal function and elimination of symptoms with minimal need for rescue therapy.  Contingencies discussed in full including contacting this office immediately if not controlling the symptoms using the rule of two's.

## 2024-06-03 NOTE — Progress Notes (Signed)
 Aaron Clarke, male    DOB: Dec 04, 1955    MRN: [id number]   Brief patient profile:  89  yowm  quit smoking 1989 told had asthma as child  referred to pulmonary clinic in [place]  01/02/2024 by EDP for eval of doe onset sev  years before covid previously treated with as needed saba but much worse since Jan 2025 / needs clearance for hip surgery by Dr Vernetta Salmons into new house/ new construction [place]  [place]   Seen in ER 1st time one month p moved into new house   Has h/o PE   [place] > stopped p 4 month rx   History of Present Illness  01/02/2024  Pulmonary/ 1st office eval/ Aaron Clarke / [place] [facility]  Chief Complaint  Patient presents with   Consult   Shortness of Breath   Dyspnea:  walking thru [employer] no problem slow pace /also limited by R hip pain  Cough: white mucus, assoc with  nasal congestion  Sleep: flat bed 2 pillows does ok hs but hears wheezing when supine x years  SABA use: 2 x daily hfa/ neb once a day 02: none  LDSCT:n/a Rec Plan A = Automatic = Always=    Symbicort  160 Take 2 puffs first thing in am and then another 2 puffs about 12 hours later.  Work on inhaler technique:   Plan B = Backup (to supplement plan A, not to replace it) Only use your albuterol  inhaler as a rescue medication Plan C = Crisis (instead of Plan B but only if Plan B stops working) - only use your albuterol  nebulizer if you first try Plan B Stop valsartan  for now  Start bisoprolol  5 mg daily in its place  due to pulse 118 at rest  Prednisone  10 mg take  4 each am x 2 days,   2 each am x 2 days,  1 each am x 2 days and stop  Please schedule a follow up office visit in 2 weeks, sooner if needed  with all medications /inhalers/ solutions in hand    -  Allergy screen 01/02/2024 >  Eos 0.3   IgE  183 -  Alpha one AT phenotype 01/02/2024 >>>  MM  173   01/16/2024  f/u ov/[place] office/Aaron Clarke re: AB maint on symbicort   did not bring meds  Chief Complaint  Patient  presents with   Follow-up    2 week  follow up    Dyspnea:  slowed by hip not sob Cough: no problem  Sleeping: no longer wheeze/ lying flat ok s resp cc  SABA use: none  02: none  Lung cancer screening: see CTa 12/27/23 no nodules  Reduce bisoprolol  to one half tablet each am  Restart valsartan - hct one pill daily - call me if blood pressure not satisfactory  Work on inhaler technique:  You are cleared for R hip surgery  Please schedule a follow up office visit in 6 weeks, call sooner if needed with all medications /inhalers/ solutions in hand    02/27/2024  f/u ov/[place] office/Aaron Clarke re: AB  maint on symbicort  160  did not bring resp rx  Chief Complaint  Patient presents with   Asthma  Dyspnea:  up and down steps/ 2 wheeled walker  Cough: none  Sleeping: loud snoring / daytime hypersomnolence with periods of apnea per fiancee  SABA use: none  02: none Rec Continue symbicort  160 Take 2 puffs first thing in  am and then another 2 puffs about 12 hours later.  Only use your albuterol  as a rescue medication  My office will be contacting you by phone for referral to home sleep study  - if you don't hear back from my office within one week please call us  back or notify us  thru MyChart and we'll address it right away.      06/03/2024  f/u ov/Aaron Clarke re: AB maint on symbicort  160 2bid   Chief Complaint  Patient presents with   Follow-up    Breathing has improved since the last visit. No new co's.   Dyspnea:  up and down steps better Cough: none Sleeping: bed is flat/ no cough  or other   resp cc  SABA use: none needed  02: none  Positional R parasternal cp > ER 05/25/24/ neg findings/ resolved     No obvious day to day or daytime variability or assoc excess/ purulent sputum or mucus plugs or hemoptysis or chest tightness, subjective wheeze or overt sinus or hb symptoms.    Also denies any obvious fluctuation of symptoms with weather or environmental changes or other  aggravating or alleviating factors except as outlined above   No unusual exposure hx or h/o childhood pna/ asthma or knowledge of premature birth.  Current Allergies, Complete Past Medical History, Past Surgical History, Family History, and Social History were reviewed in [employer] record.  ROS  The following are not active complaints unless bolded Hoarseness, sore throat, dysphagia, dental problems, itching, sneezing,  nasal congestion or discharge of excess mucus or purulent secretions, ear ache,   fever, chills, sweats, unintended wt loss or wt gain, classically pleuritic or exertional cp,  orthopnea pnd or arm/hand swelling  or leg swelling, presyncope, palpitations, abdominal pain, anorexia, nausea, vomiting, diarrhea  or change in bowel habits or change in bladder habits, change in stools or change in urine, dysuria, hematuria,  rash, arthralgias, visual complaints, headache, numbness, weakness or ataxia or problems with walking or coordination,  change in mood or  memory.        Current Meds  Medication Sig   albuterol  (PROVENTIL ) (2.5 MG/3ML) 0.083% nebulizer solution Take 3 mLs (2.5 mg total) by nebulization every 6 (six) hours as needed for wheezing or shortness of breath.   albuterol  (VENTOLIN  HFA) 108 (90 Base) MCG/ACT inhaler Inhale 1-2 puffs into the lungs every 6 (six) hours as needed for wheezing or shortness of breath.   apixaban  (ELIQUIS ) 5 MG TABS tablet Take 2 tablets (10 mg total) by mouth 2 (two) times daily for 7 days, THEN 1 tablet (5 mg total) 2 (two) times daily.   bisoprolol  (ZEBETA ) 5 MG tablet Take 1 tablet (5 mg total) by mouth daily.   budesonide -formoterol  (SYMBICORT ) 160-4.5 MCG/ACT inhaler Take 2 puffs first thing in am and then another 2 puffs about 12 hours later. (Patient taking differently: Inhale 2 puffs into the lungs in the morning and at bedtime. Take 2 puffs first thing in am and then another 2 puffs about 12 hours later.)    celecoxib  (CELEBREX ) 200 MG capsule TAKE 1 CAPSULE (200 MG TOTAL) BY MOUTH 2 (TWO) TIMES DAILY BETWEEN MEALS AS NEEDED.   methocarbamol  (ROBAXIN ) 500 MG tablet Take 1 tablet (500 mg total) by mouth every 6 (six) hours as needed for muscle spasms.   tirzepatide  (ZEPBOUND ) 2.5 MG/0.5ML injection vial Inject 2.5 mg into the skin once a week.  Past Medical History:  Diagnosis Date   Asthma    Cholelithiases    Chronic kidney disease    Kidney stone 04/2011   Family history of breast cancer    mother      Objective:    Wts  06/03/2024            314  02/27/2024              295   01/16/24 (!) 300 lb 12.8 oz (136.4 kg)  01/02/24 291 lb (132 kg)  12/31/23 295 lb (133.8 kg)    Vital signs reviewed  06/03/2024  - Note at rest 02 sats  96% on RA   General appearance:    MO (by bmi) pleasant amb wm nad     HEENT : Oropharynx  clear       NECK :  without  apparent JVD/ palpable Nodes/TM    LUNGS: no acc muscle use,  Min barrel contour chest wall with bilateral  slightly decreased bs s audible wheeze and  without cough on insp or exp maneuvers and min  Hyperresonant  to  percussion bilaterally    CV:  RRR  no s3 or murmur or increase in P2, and no edema   ABD:  soft and nontender with pos end  insp Hoover's  in the supine position.  No bruits or organomegaly appreciated   MS:  Nl gait/ ext warm without deformities Or obvious joint restrictions  calf tenderness, cyanosis or clubbing     SKIN: warm and dry without lesions    NEURO:  alert, approp, nl sensorium with  no motor or cerebellar deficits apparent.              Assessment

## 2024-06-03 NOTE — Assessment & Plan Note (Signed)
 Change arb to bisoprolol  5 mg daily due to tachycardia from Beta agonists   BP up so Added back valsartan  160-12.5 one daily with f/u PCP planned - can use one half a tablet if bp by self monitor too low   Discussed in detail all the  indications, usual  risks and alternatives  relative to the benefits with patient who agrees to proceed with Rx as outlined.          No pulmonary f/u planned - we'll see him prn     Each maintenance medication was reviewed in detail including emphasizing most importantly the difference between maintenance and prns and under what circumstances the prns are to be triggered using an action plan format where appropriate.  Total time for H and P, chart review, counseling, reviewing hfa device(s) and generating customized AVS unique to this office visit / same day charting = 32 min summary final f/u ov

## 2024-06-04 ENCOUNTER — Ambulatory Visit: Admitting: Family Medicine

## 2024-06-11 ENCOUNTER — Ambulatory Visit: Attending: Emergency Medicine | Admitting: Emergency Medicine

## 2024-06-11 ENCOUNTER — Encounter: Payer: Self-pay | Admitting: Emergency Medicine

## 2024-06-11 VITALS — BP 114/64 | HR 77 | Ht 72.0 in | Wt 306.0 lb

## 2024-06-11 DIAGNOSIS — I2782 Chronic pulmonary embolism: Secondary | ICD-10-CM | POA: Diagnosis not present

## 2024-06-11 DIAGNOSIS — N1832 Chronic kidney disease, stage 3b: Secondary | ICD-10-CM

## 2024-06-11 DIAGNOSIS — I10 Essential (primary) hypertension: Secondary | ICD-10-CM

## 2024-06-11 DIAGNOSIS — R079 Chest pain, unspecified: Secondary | ICD-10-CM

## 2024-06-11 DIAGNOSIS — J449 Chronic obstructive pulmonary disease, unspecified: Secondary | ICD-10-CM | POA: Diagnosis not present

## 2024-06-11 NOTE — Progress Notes (Signed)
 Cardiology Office Note:    Date:  06/11/2024  ID:  Aaron Clarke, DOB 05-Sep-1956, MRN [id number] PCP: Kayla Jeoffrey RAMAN, FNP  [facility] Providers Cardiologist:  None       Patient Profile:       Chief Complaint: ED follow-up for chest pain History of Present Illness:  Aaron Clarke is a 68 y.o. male with visit-pertinent history of asthma, chronic back pain, hypertension, CKD stage II  Patient was first evaluated by cardiology on 10/23/2022 while in the ED for elevated troponin.  He reported left first 3 digit hand pain.  Denied any arm pain or chest pain.  Troponins were elevated at 157, 199, 164.  CTA chest showed bilateral segmental and subsegmental pulmonary emboli and RV distention with cardiomegaly.  Troponin leak attributed to hypoxia with PE.   He was recently admitted on 03/11/2024 for pulmonary embolism.  Likely provoked in the setting of recent orthopedic surgical intervention.  Troponin elevation likely demand ischemia in the setting of pulmonary embolism.  Echocardiogram on 03/14/2024 showed LVEF 55 to 60%, moderate LVH, indeterminate diastolic parameters, normal RV function and size, left atrial size moderately dilated, mild calcification of aortic valve.  He was seen in the ED on 05/25/2024 for chest pain.  EKG was without acute changes.  His troponins were negative.  He had a CTA of his chest that showed no pulmonary embolism.  He was to follow-up with cardiology service.  Discussed the use of AI scribe software for clinical note transcription with the patient, who gave verbal consent to proceed.  History of Present Illness Aaron Clarke is a 68 year old male with a history of pulmonary embolism who presents with chest pain.  Today patient is doing well overall.  He reported chest pains that started approximately 1 month ago.  He he experiences sharp right-sided chest pain for over a month, initially leading to an emergency room visit in July. The pain is  movement-induced and has improved over the past two weeks, now occurring occasionally.  No pain to palpation.  Radiation of pain across his chest through left arm.  There is no exertional chest pain during physical activities, including weekly gym workouts.  He has experienced pulmonary embolisms, first in 2023 and again in April. He is on Eliquis  since the initial diagnosis and has been compliant.  Today patient denies dyspnea, orthopnea, PND, leg swelling, palpitations, syncope, presyncope, melena, hematochezia  Review of systems:  Please see the history of present illness. All other systems are reviewed and otherwise negative.      Studies Reviewed:    EKG Interpretation Date/Time:  Wednesday June 11 2024 10:29:47 EDT Ventricular Rate:  77 PR Interval:  148 QRS Duration:  110 QT Interval:  388 QTC Calculation: 439 R Axis:   58  Text Interpretation: Normal sinus rhythm Normal ECG When compared with ECG of 25-May-2024 12:40, PREVIOUS ECG IS PRESENT Confirmed by Rana Dixon [phone]) on 06/11/2024 1:13:40 PM    Echocardiogram 03/14/2024  1. Left ventricular ejection fraction, by estimation, is 55 to 60%. The  left ventricle has normal function. Left ventricular endocardial border  not optimally defined to evaluate regional wall motion. There is moderate  left ventricular hypertrophy. Left  ventricular diastolic parameters are indeterminate.   2. Right ventricular systolic function is normal. The right ventricular  size is normal. Tricuspid regurgitation signal is inadequate for assessing  PA pressure.   3. Left atrial size was moderately dilated.   4. The mitral valve is  normal in structure. No evidence of mitral valve  regurgitation. No evidence of mitral stenosis.   5. The aortic valve was not well visualized. There is mild calcification  of the aortic valve. There is mild thickening of the aortic valve. Aortic  valve regurgitation is not visualized. No aortic stenosis is  present.   6. The inferior vena cava is normal in size with greater than 50%  respiratory variability, suggesting right atrial pressure of 3 mmHg.   Risk Assessment/Calculations:              Physical Exam:   VS:  BP 114/64 (BP Location: Right Arm, Patient Position: Sitting, Cuff Size: Large)   Pulse 77   Ht 6' (1.829 m)   Wt (!) 306 lb (138.8 kg)   BMI 41.50 kg/m    Wt Readings from Last 3 Encounters:  06/11/24 (!) 306 lb (138.8 kg)  06/03/24 (!) 314 lb (142.4 kg)  05/25/24 280 lb (127 kg)    GEN: Well nourished, well developed in no acute distress NECK: No JVD; No carotid bruits CARDIAC: RRR, no murmurs, rubs, gallops RESPIRATORY:  Clear to auscultation without rales, wheezing or rhonchi  ABDOMEN: Soft, non-tender, non-distended EXTREMITIES:  No edema; No acute deformity      Assessment and Plan:  Chest pain Patient presents with x1 month of chest pain that is described as sharp and right-sided.  Pain is precipitated by movement with radiation of pain across chest and down left arm.  He denies any exertional symptoms as he works out at [employer] twice weekly.  There is no pain to palpation.  Has slightly improved over the last 2 weeks. Echocardiogram 02/2024 with LVEF 55 to 60% - EKG today is without acute ischemic changes - Plan for cardiac PET/CT for further ischemic evaluation.  Deferred coronary CTA due to CKD - BMET and CBC today  Pulmonary embolism H/o PE in 10/2022 Recently admitted on 02/2024 for PE that was likely provoked in the setting of recent orthopedic surgical intervention Echocardiogram 02/2024 with normal LVEF 55 to 60% Most recent CT angio chest on 05/2024 during ED evaluation for chest pain showed negative examination for pulmonary embolism - Today he denies any dyspnea, leg swelling, hemoptysis - Continue Eliquis  per PCP  COPD No acute exacerbation today - Continue home bronchodilator per PCP  Hypertension Blood pressure today is 114/64  well-controlled - Continue bisoprolol  5 mg daily and valsartan -hydrochlorothiazide  160-12.5 mg daily  Chronic kidney disease stage IIIb Creatinine 1.83 and GFR 40 on 05/25/2024 - Remains stable - Avoid nephrotoxic agents   Informed Consent   Shared Decision Making/Informed Consent The risks [chest pain, shortness of breath, cardiac arrhythmias, dizziness, blood pressure fluctuations, myocardial infarction, stroke/transient ischemic attack, nausea, vomiting, allergic reaction, radiation exposure, metallic taste sensation and life-threatening complications (estimated to be 1 in 10,000)], benefits (risk stratification, diagnosing coronary artery disease, treatment guidance) and alternatives of a cardiac PET stress test were discussed in detail with Mr. Agrusa and he agrees to proceed.     Dispo:  Return in about 3 months (around 09/11/2024).  Former Dr. Delford patient  Signed, Lum LITTIE Louis, NP

## 2024-06-11 NOTE — Patient Instructions (Signed)
 Medication Instructions:  NO CHANGES   Lab Work: BMET AND CBC TO BE DONE TODAY.   Testing/Procedures:    Please report to Radiology at the [facility] Main Entrance 30 minutes early for your test.  [address] [place], [place] [address]                         OR   Please report to Radiology at [facility] Main Entrance, medical mall, 30 mins prior to your test.  [address]  [place], [place]  How to Prepare for Your Cardiac PET/CT Stress Test:  Nothing to eat or drink, except water, 3 hours prior to arrival time.  NO caffeine/decaffeinated products, or chocolate 12 hours prior to arrival. (Please note decaffeinated beverages (teas/coffees) still contain caffeine).  If you have caffeine within 12 hours prior, the test will need to be rescheduled.  Medication instructions: Do not take erectile dysfunction medications for 72 hours prior to test (sildenafil, tadalafil) Do not take nitrates (isosorbide mononitrate, Ranexa) the day before or day of test Do not take tamsulosin the day before or morning of test Hold theophylline containing medications for 12 hours. Hold Dipyridamole 48 hours prior to the test.  Diabetic Preparation: If able to eat breakfast prior to 3 hour fasting, you may take all medications, including your insulin. Do not worry if you miss your breakfast dose of insulin - start at your next meal. If you do not eat prior to 3 hour fast-Hold all diabetes (oral and insulin) medications. Patients who wear a continuous glucose monitor MUST remove the device prior to scanning.  You may take your remaining medications with water.  NO perfume, cologne or lotion on chest or abdomen area. FEMALES - Please avoid wearing dresses to this appointment.  Total time is 1 to 2 hours; you may want to bring reading material for the waiting time.  IF YOU THINK YOU MAY BE PREGNANT, OR ARE NURSING PLEASE INFORM THE TECHNOLOGIST.  In  preparation for your appointment, medication and supplies will be purchased.  Appointment availability is limited, so if you need to cancel or reschedule, please call the Radiology Department Scheduler at [phone] 24 hours in advance to avoid a cancellation fee of $100.00  What to Expect When you Arrive:  Once you arrive and check in for your appointment, you will be taken to a preparation room within the Radiology Department.  A technologist or Nurse will obtain your medical history, verify that you are correctly prepped for the exam, and explain the procedure.  Afterwards, an IV will be started in your arm and electrodes will be placed on your skin for EKG monitoring during the stress portion of the exam. Then you will be escorted to the PET/CT scanner.  There, staff will get you positioned on the scanner and obtain a blood pressure and EKG.  During the exam, you will continue to be connected to the EKG and blood pressure machines.  A small, safe amount of a radioactive tracer will be injected in your IV to obtain a series of pictures of your heart along with an injection of a stress agent.    After your Exam:  It is recommended that you eat a meal and drink a caffeinated beverage to counter act any effects of the stress agent.  Drink plenty of fluids for the remainder of the day and urinate frequently for the first couple of hours after the  exam.  Your doctor will inform you of your test results within 7-10 business days.  For more information and frequently asked questions, please visit our website: [email or web address]  For questions about your test or how to prepare for your test, please call: Cardiac Imaging Nurse Navigators Office: [phone]   Follow-Up: At [facility], you and your health needs are our priority.  As part of our continuing mission to provide you with exceptional heart care, our providers are all part of one team.  This team includes your  primary Cardiologist (physician) and Advanced Practice Providers or APPs (Physician Assistants and Nurse Practitioners) who all work together to provide you with the care you need, when you need it.  Your next appointment:   3 MONTHS  Provider:   MADISON FOUNTAIN, DNP}

## 2024-06-12 ENCOUNTER — Ambulatory Visit: Payer: Self-pay | Admitting: Emergency Medicine

## 2024-06-12 LAB — CBC
Hematocrit: 46.8 % (ref 37.5–51.0)
Hemoglobin: 15.2 g/dL (ref 13.0–17.7)
MCH: 28.9 pg (ref 26.6–33.0)
MCHC: 32.5 g/dL (ref 31.5–35.7)
MCV: 89 fL (ref 79–97)
Platelets: 214 10*3/uL (ref 150–450)
RBC: 5.26 x10E6/uL (ref 4.14–5.80)
RDW: 14.3 % (ref 11.6–15.4)
WBC: 8.5 10*3/uL (ref 3.4–10.8)

## 2024-06-12 LAB — BASIC METABOLIC PANEL WITH GFR
BUN/Creatinine Ratio: 22 (ref 10–24)
BUN: 37 mg/dL — ABNORMAL HIGH (ref 8–27)
CO2: 18 mmol/L — ABNORMAL LOW (ref 20–29)
Calcium: 9.5 mg/dL (ref 8.6–10.2)
Chloride: 110 mmol/L — ABNORMAL HIGH (ref 96–106)
Creatinine, Ser: 1.68 mg/dL — ABNORMAL HIGH (ref 0.76–1.27)
Glucose: 107 mg/dL — ABNORMAL HIGH (ref 70–99)
Potassium: 4.7 mmol/L (ref 3.5–5.2)
Sodium: 145 mmol/L — ABNORMAL HIGH (ref 134–144)
eGFR: 45 mL/min/{1.73_m2} — ABNORMAL LOW

## 2024-06-19 ENCOUNTER — Other Ambulatory Visit: Payer: Self-pay | Admitting: Family Medicine

## 2024-06-24 NOTE — Telephone Encounter (Signed)
 Called to inquire. No answer. Lvm for follow up call to schedule if he is on medication.

## 2024-06-30 ENCOUNTER — Encounter: Payer: Self-pay | Admitting: Orthopaedic Surgery

## 2024-06-30 ENCOUNTER — Other Ambulatory Visit (INDEPENDENT_AMBULATORY_CARE_PROVIDER_SITE_OTHER): Payer: Self-pay

## 2024-06-30 ENCOUNTER — Ambulatory Visit: Admitting: Orthopaedic Surgery

## 2024-06-30 DIAGNOSIS — Z96641 Presence of right artificial hip joint: Secondary | ICD-10-CM | POA: Diagnosis not present

## 2024-06-30 NOTE — Progress Notes (Signed)
 The patient is here today just past 4 months status post a right total hip replacement to treat significant right hip pain and arthritis.  He says the right hip is doing well.  He has been dealing with some chest pain issues and was seen in the emergency room last month.  His CTA was negative for any pulmonary embolus.  He does have cardiology follow-up and he says they are going to perform a stress test soon.  He denies any significant issues with his right hip or his left hip.  He walks without assist device.  He has 68 years old.  On exam his right hip moves smoothly and fluidly as does his left hip.  There is no blocks or rotation.  Standing AP pelvis and lateral of the right hip shows a well-seated right total hip arthroplasty with no complicating features.  His left hip joint space is narrowing.  He will continue to increase activity as comfort allows with no restrictions.  Will see him back in 6 months we will have just a standing AP pelvis at that visit.  If there are issues before then he knows to reach out to us  and let us  know.

## 2024-07-02 ENCOUNTER — Ambulatory Visit: Admitting: Orthopaedic Surgery

## 2024-07-08 ENCOUNTER — Encounter (HOSPITAL_COMMUNITY): Payer: Self-pay

## 2024-07-10 ENCOUNTER — Ambulatory Visit
Admission: RE | Admit: 2024-07-10 | Discharge: 2024-07-10 | Disposition: A | Discharge status: Home / Self Care | Source: Ambulatory Visit | Attending: Emergency Medicine | Admitting: Emergency Medicine

## 2024-07-10 DIAGNOSIS — R079 Chest pain, unspecified: Secondary | ICD-10-CM | POA: Insufficient documentation

## 2024-07-10 DIAGNOSIS — I10 Essential (primary) hypertension: Secondary | ICD-10-CM | POA: Insufficient documentation

## 2024-07-10 MED ORDER — RUBIDIUM RB82 GENERATOR (RUBYFILL)
25.0000 | PACK | Freq: Once | INTRAVENOUS | Status: AC
Start: 2024-07-10 — End: 2024-07-10
  Administered 2024-07-10: 25.27 via INTRAVENOUS

## 2024-07-10 MED ORDER — REGADENOSON 0.4 MG/5ML IV SOLN
0.4000 mg | Freq: Once | INTRAVENOUS | Status: AC
  Administered 2024-07-10: 0.4 mg via INTRAVENOUS
  Filled 2024-07-10: qty 5

## 2024-07-10 MED ORDER — REGADENOSON 0.4 MG/5ML IV SOLN
INTRAVENOUS | Status: AC
  Filled 2024-07-10: qty 5

## 2024-07-10 MED ORDER — RUBIDIUM RB82 GENERATOR (RUBYFILL)
25.0000 | PACK | Freq: Once | INTRAVENOUS | Status: AC
  Administered 2024-07-10: 25.34 via INTRAVENOUS

## 2024-07-10 MED ORDER — RUBIDIUM RB82 GENERATOR (RUBYFILL)
25.0000 | PACK | Freq: Once | INTRAVENOUS | Status: AC
  Administered 2024-07-10: 25.04 via INTRAVENOUS

## 2024-07-10 NOTE — Progress Notes (Signed)
 Patient tolerated tolerated both stress injections for his cardiac PET scan without incident. Patient maintained acceptable vital signs throughout the test and was offered caffeine after test.  Patient ambulated out of department with a steady gait.

## 2024-07-10 NOTE — Progress Notes (Signed)
 Patient presented for a cardiac PET stress test.  During the stress portion of the test, the machine failed to collect images.  Patient was agreeable to waiting about an hour to get re-injected with medication.  Dr. Mady was notified and agreeable with plan to re-stress the patient as well.

## 2024-07-11 LAB — NM PET CT CARDIAC PERFUSION MULTI W/ABSOLUTE BLOODFLOW
LV dias vol: 144 mL (ref 62–150)
LV sys vol: 73 mL (ref 4.2–5.8)
MBFR: 2.39
Nuc Rest EF: 40 %
Nuc Stress EF: 49 %
Peak HR: 78 {beats}/min
Rest HR: 67 {beats}/min
Rest MBF: 0.67 ml/g/min
Rest Nuclear Isotope Dose: 25 mCi
SRS: 0
SSS: 2
ST Depression (mm): 0 mm
Stress MBF: 1.6 ml/g/min
Stress Nuclear Isotope Dose: 25.3 mCi
TID: 1

## 2024-07-14 ENCOUNTER — Telehealth: Payer: Self-pay | Admitting: Emergency Medicine

## 2024-07-14 NOTE — Telephone Encounter (Signed)
  Pt is calling to get PET scan result

## 2024-07-24 ENCOUNTER — Encounter: Payer: Self-pay | Admitting: Emergency Medicine

## 2024-07-24 ENCOUNTER — Ambulatory Visit: Attending: Emergency Medicine | Admitting: Emergency Medicine

## 2024-07-24 VITALS — BP 106/70 | HR 79 | Ht 72.0 in | Wt 306.0 lb

## 2024-07-24 DIAGNOSIS — I10 Essential (primary) hypertension: Secondary | ICD-10-CM

## 2024-07-24 DIAGNOSIS — J449 Chronic obstructive pulmonary disease, unspecified: Secondary | ICD-10-CM | POA: Diagnosis not present

## 2024-07-24 DIAGNOSIS — Z86711 Personal history of pulmonary embolism: Secondary | ICD-10-CM | POA: Diagnosis not present

## 2024-07-24 DIAGNOSIS — N1832 Chronic kidney disease, stage 3b: Secondary | ICD-10-CM | POA: Diagnosis not present

## 2024-07-24 DIAGNOSIS — Z79899 Other long term (current) drug therapy: Secondary | ICD-10-CM | POA: Diagnosis not present

## 2024-07-24 DIAGNOSIS — R0609 Other forms of dyspnea: Secondary | ICD-10-CM

## 2024-07-24 NOTE — H&P (View-Only) (Signed)
 Cardiology Office Note:    Date:  07/24/2024  ID:  Aaron Clarke, DOB [birth year], MRN [id number] PCP: Kayla Jeoffrey RAMAN, FNP  [facility] Providers Cardiologist:  Alm Clay, MD Cardiology APP:  Rana Lum CROME, NP       Patient Profile:       Chief Complaint: Follow-up abnormal stress test History of Present Illness:  Aaron Clarke is a 68 y.o. male with visit-pertinent history of asthma, chronic back pain, hypertension, CKD stage II   Patient was first evaluated by cardiology on 10/23/2022 while in the ED for elevated troponin.  He reported left first 3 digit hand pain.  Denied any arm pain or chest pain.  Troponins were elevated at 157, 199, 164.  CTA chest showed bilateral segmental and subsegmental pulmonary emboli and RV distention with cardiomegaly.  Troponin leak attributed to hypoxia with PE.    He was recently admitted on 03/11/2024 for pulmonary embolism.  Likely provoked in the setting of recent orthopedic surgical intervention.  Troponin elevation likely demand ischemia in the setting of pulmonary embolism.  Echocardiogram on 03/14/2024 showed LVEF 55 to 60%, moderate LVH, indeterminate diastolic parameters, normal RV function and size, left atrial size moderately dilated, mild calcification of aortic valve.   He was seen in the ED on 05/25/2024 for chest pain.  EKG was without acute changes.  His troponins were negative.  He had a CTA of his chest that showed no pulmonary embolism.  He was to follow-up with cardiology service.  He was seen for follow-up on 06/08/2024.  He reported chest pains that were sharp and right-sided for at least over a month.  The pain was movement induced and has improved over the past several weeks.  He had radiation of pain across his chest to his left arm.  He denied any exertional chest pains during activities including his weekly gym workouts.  He underwent cardiac PET/CT on 07/10/2024 showing small defect with mild reduction in  uptake present in the mid inferior location that was partially reversible consistent with infarction and peri-infarct ischemia.  Rest EF 40% and stress EF 49%.   Discussed the use of AI scribe software for clinical note transcription with the patient, who gave verbal consent to proceed.  History of Present Illness Aaron Clarke is a 68 year old male who presents with worsening shortness of breath on exertion.  Today patient tells me he is not doing much better since last office visit.  He experiences increased fatigue and shortness of breath with minimal exertion, such as walking short distances. He does report that his chest pains have improved.  No longer experiences chest pains on exertion.  He does report new onset ankle swelling.  No swelling to calves or thighs.  Denies orthopnea or PND.  He is without palpitations, lightheadedness, or dizziness.  He works in a physically demanding job but has reduced his activity level due to symptoms.  He occasionally uses Tylenol  for pain. He primarily drinks water and avoids soft drinks.  Review of systems:  Please see the history of present illness. All other systems are reviewed and otherwise negative.      Studies Reviewed:        Cardiac PET/CT 07/10/2024   LV perfusion is abnormal. Defect 1: There is a small defect with mild reduction in uptake present in the mid inferior location(s) that is partially reversible. Consistent with infarction and peri-infarct ischemia.   Rest left ventricular function is abnormal. Rest global function is moderately  reduced. Rest EF: 40%. Stress left ventricular function is abnormal. Stress global function is mildly reduced. Stress EF: 49%. End diastolic cavity size is normal. End systolic cavity size is normal.   Myocardial blood flow was computed to be 0.74ml/g/min at rest and 1.60ml/g/min at stress. Global myocardial blood flow reserve was 2.39 and was normal.   Coronary calcium was absent on the attenuation  correction CT images.   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 The study is intermediate risk.   Electronically signed by: Lonni Hanson, MD.  Echocardiogram 03/14/2024  1. Left ventricular ejection fraction, by estimation, is 55 to 60%. The  left ventricle has normal function. Left ventricular endocardial border  not optimally defined to evaluate regional wall motion. There is moderate  left ventricular hypertrophy. Left  ventricular diastolic parameters are indeterminate.   2. Right ventricular systolic function is normal. The right ventricular  size is normal. Tricuspid regurgitation signal is inadequate for assessing  PA pressure.   3. Left atrial size was moderately dilated.   4. The mitral valve is normal in structure. No evidence of mitral valve  regurgitation. No evidence of mitral stenosis.   5. The aortic valve was not well visualized. There is mild calcification  of the aortic valve. There is mild thickening of the aortic valve. Aortic  valve regurgitation is not visualized. No aortic stenosis is present.   6. The inferior vena cava is normal in size with greater than 50%  respiratory variability, suggesting right atrial pressure of 3 mmHg.   Risk Assessment/Calculations:              Physical Exam:   VS:  BP 106/70 (BP Location: Left Arm, Patient Position: Sitting, Cuff Size: Large)   Pulse 79   Ht 6' (1.829 m)   Wt (!) 306 lb (138.8 kg)   SpO2 94%   BMI 41.50 kg/m    Wt Readings from Last 3 Encounters:  07/24/24 (!) 306 lb (138.8 kg)  06/11/24 (!) 306 lb (138.8 kg)  06/03/24 (!) 314 lb (142.4 kg)    GEN: Well nourished, well developed in no acute distress NECK: No JVD; No carotid bruits CARDIAC: RRR, no murmurs, rubs, gallops RESPIRATORY:  Clear to auscultation without rales, wheezing or rhonchi  ABDOMEN: Soft, non-tender, non-distended EXTREMITIES:  No edema;  No acute deformity      Assessment and Plan:  Dyspnea on exertion Cardiac PET/CT 06/2024 was consistent with small region of inferior infarction with peri-infarct ischemia as well as mildly to mildly reduced LVEF. Rest EF 40%, Stress EF 49% Echocardiogram 02/2024 with LVEF 55 to 60% - Today he reports ongoing shortness of breath x 2 months with worsening dyspnea on exertion.  Prior chest pains have improved.  Now is experiencing mild bilateral pedal edema - Spoke with DOD Dr. Shlomo regarding patient's case in regard to his most recent abnormal PET stress test.  We agreed to proceed with left heart catheterization for further ischemic evaluation - He does have history of CKD with most recent creatinine 1.68 and GFR 45 on 7/23.  Plan for repeat BMET today.  He will adequately hydrate prior to procedure.  I confirmed he is not taking NSAIDs  - Cardiac catheterization scheduled 9/12 with Dr. Anner at 10:30 AM - Stop Eliquis  2 days prior to procedure.  Stop valsartan -hydrochlorothiazide  1 day prior to procedure  Pulmonary embolism H/o PE in 10/2022 Recently admitted on  02/2024 for PE that was likely provoked in the setting of recent orthopedic surgical intervention Echocardiogram 02/2024 with normal LVEF 55 to 60% Most recent CT angio chest on 05/2024 during ED evaluation for chest pain showed negative examination for pulmonary embolism - Today he denies any leg swelling or hemoptysis - Continue Eliquis  per PCP  COPD No acute exacerbation appreciated today - Continue home bronchodilator regimen  Hypertension Blood pressure today is well-controlled at 106/70 - Continue bisoprolol  5 mg daily and valsartan -hydrochlorothiazide  160-12.5 mg daily   Chronic kidney disease stage IIIb Creatinine 1.68 and GFR 45 on 05/2024 - Remains stable - Avoid nephrotoxic agents   Informed Consent   Shared Decision Making/Informed Consent The risks [stroke (1 in 1000), death (1 in 1000), kidney failure  [usually temporary] (1 in 500), bleeding (1 in 200), allergic reaction [possibly serious] (1 in 200)], benefits (diagnostic support and management of coronary artery disease) and alternatives of a cardiac catheterization were discussed in detail with Aaron Clarke and he is willing to proceed.     Dispo:  Return in about 2 weeks (around 08/07/2024).  Signed, Lum LITTIE Louis, NP

## 2024-07-24 NOTE — Patient Instructions (Signed)
 Medication Instructions:  NO CHANGES   Lab Work: BMET AND CBC TO BE DONE TODAY.   Testing/Procedures:  [facility] A DEPT OF [place] AT [address] A DEPT OF THE [place] [address] [place] [address] Dept: [phone] Loc: [phone]  Aaron Clarke  07/24/2024  You are scheduled for a Cardiac Catheterization on Friday, September 12 with Dr. Alm Clay.  1. Please arrive at the [facility] (Main Entrance A) at [facility]: [address] [place], [place] [address] at 6:00 AM (This time is 4 1/2 hour(s) before your procedure to ensure your preparation).   Free valet parking service is available. You will check in at ADMITTING. The support person will be asked to wait in the waiting room.  It is OK to have someone drop you off and come back when you are ready to be discharged.    Special note: Every effort is made to have your procedure done on time. Please understand that emergencies sometimes delay scheduled procedures.  2. Diet: Nothing to eat after midnight.   3. Hydration: You need to be well hydrated before your procedure. On September 12, you may drink approved liquids (see below) until 2 hours before the procedure, with 16 oz of water as your last intake.   List of approved liquids water, clear juice, clear tea, black coffee, fruit juices, non-citric and without pulp, carbonated beverages, Gatorade, Kool -Aid, plain Jello-O and plain ice popsicles.  4. Labs: You will need to have blood drawn on Thursday, September 4 at [facility] (1st Floor), [address], [place], [place] [address]. You do not need to be fasting.  5. Medication instructions in preparation for your procedure:   Contrast Allergy: No  Stop taking Eliquis  (Apixiban) on Wednesday, September 10.  Stop taking, Diovan  (Valsartan ) Thursday, July 31, 2024.  On the morning of  your procedure, take any morning medicines NOT listed above.  You may use sips of water.  6. Plan to go home the same day, you will only stay overnight if medically necessary. 7. Bring a current list of your medications and current insurance cards. 8. You MUST have a responsible person to drive you home. 9. Someone MUST be with you the first 24 hours after you arrive home or your discharge will be delayed. 10. Please wear clothes that are easy to get on and off and wear slip-on shoes.  Thank you for allowing us  to care for you!   -- [facility]   Follow-Up: At [facility], you and your health needs are our priority.  As part of our continuing mission to provide you with exceptional heart care, our providers are all part of one team.  This team includes your primary Cardiologist (physician) and Advanced Practice Providers or APPs (Physician Assistants and Nurse Practitioners) who all work together to provide you with the care you need, when you need it.  Your next appointment:   2 WEEKS POST CATH  Provider:   MADISON FOUNTAIN, NP

## 2024-07-24 NOTE — Progress Notes (Signed)
 Cardiology Office Note:    Date:  07/24/2024  ID:  Aaron Clarke, DOB [birth year], MRN [id number] PCP: Kayla Jeoffrey RAMAN, FNP  [facility] Providers Cardiologist:  Alm Clay, MD Cardiology APP:  Rana Aaron CROME, NP       Patient Profile:       Chief Complaint: Follow-up abnormal stress test History of Present Illness:  Aaron Clarke is a 68 y.o. male with visit-pertinent history of asthma, chronic back pain, hypertension, CKD stage II   Patient was first evaluated by cardiology on 10/23/2022 while in the ED for elevated troponin.  He reported left first 3 digit hand pain.  Denied any arm pain or chest pain.  Troponins were elevated at 157, 199, 164.  CTA chest showed bilateral segmental and subsegmental pulmonary emboli and RV distention with cardiomegaly.  Troponin leak attributed to hypoxia with PE.    He was recently admitted on 03/11/2024 for pulmonary embolism.  Likely provoked in the setting of recent orthopedic surgical intervention.  Troponin elevation likely demand ischemia in the setting of pulmonary embolism.  Echocardiogram on 03/14/2024 showed LVEF 55 to 60%, moderate LVH, indeterminate diastolic parameters, normal RV function and size, left atrial size moderately dilated, mild calcification of aortic valve.   He was seen in the ED on 05/25/2024 for chest pain.  EKG was without acute changes.  His troponins were negative.  He had a CTA of his chest that showed no pulmonary embolism.  He was to follow-up with cardiology service.  He was seen for follow-up on 06/08/2024.  He reported chest pains that were sharp and right-sided for at least over a month.  The pain was movement induced and has improved over the past several weeks.  He had radiation of pain across his chest to his left arm.  He denied any exertional chest pains during activities including his weekly gym workouts.  He underwent cardiac PET/CT on 07/10/2024 showing small defect with mild reduction in  uptake present in the mid inferior location that was partially reversible consistent with infarction and peri-infarct ischemia.  Rest EF 40% and stress EF 49%.   Discussed the use of AI scribe software for clinical note transcription with the patient, who gave verbal consent to proceed.  History of Present Illness Aaron Clarke is a 68 year old male who presents with worsening shortness of breath on exertion.  Today patient tells me he is not doing much better since last office visit.  He experiences increased fatigue and shortness of breath with minimal exertion, such as walking short distances. He does report that his chest pains have improved.  No longer experiences chest pains on exertion.  He does report new onset ankle swelling.  No swelling to calves or thighs.  Denies orthopnea or PND.  He is without palpitations, lightheadedness, or dizziness.  He works in a physically demanding job but has reduced his activity level due to symptoms.  He occasionally uses Tylenol  for pain. He primarily drinks water and avoids soft drinks.  Review of systems:  Please see the history of present illness. All other systems are reviewed and otherwise negative.      Studies Reviewed:        Cardiac PET/CT 07/10/2024   LV perfusion is abnormal. Defect 1: There is a small defect with mild reduction in uptake present in the mid inferior location(s) that is partially reversible. Consistent with infarction and peri-infarct ischemia.   Rest left ventricular function is abnormal. Rest global function is moderately  reduced. Rest EF: 40%. Stress left ventricular function is abnormal. Stress global function is mildly reduced. Stress EF: 49%. End diastolic cavity size is normal. End systolic cavity size is normal.   Myocardial blood flow was computed to be 0.74ml/g/min at rest and 1.60ml/g/min at stress. Global myocardial blood flow reserve was 2.39 and was normal.   Coronary calcium was absent on the attenuation  correction CT images.   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 The study is intermediate risk.   Electronically signed by: Lonni Hanson, MD.  Echocardiogram 03/14/2024  1. Left ventricular ejection fraction, by estimation, is 55 to 60%. The  left ventricle has normal function. Left ventricular endocardial border  not optimally defined to evaluate regional wall motion. There is moderate  left ventricular hypertrophy. Left  ventricular diastolic parameters are indeterminate.   2. Right ventricular systolic function is normal. The right ventricular  size is normal. Tricuspid regurgitation signal is inadequate for assessing  PA pressure.   3. Left atrial size was moderately dilated.   4. The mitral valve is normal in structure. No evidence of mitral valve  regurgitation. No evidence of mitral stenosis.   5. The aortic valve was not well visualized. There is mild calcification  of the aortic valve. There is mild thickening of the aortic valve. Aortic  valve regurgitation is not visualized. No aortic stenosis is present.   6. The inferior vena cava is normal in size with greater than 50%  respiratory variability, suggesting right atrial pressure of 3 mmHg.   Risk Assessment/Calculations:              Physical Exam:   VS:  BP 106/70 (BP Location: Left Arm, Patient Position: Sitting, Cuff Size: Large)   Pulse 79   Ht 6' (1.829 m)   Wt (!) 306 lb (138.8 kg)   SpO2 94%   BMI 41.50 kg/m    Wt Readings from Last 3 Encounters:  07/24/24 (!) 306 lb (138.8 kg)  06/11/24 (!) 306 lb (138.8 kg)  06/03/24 (!) 314 lb (142.4 kg)    GEN: Well nourished, well developed in no acute distress NECK: No JVD; No carotid bruits CARDIAC: RRR, no murmurs, rubs, gallops RESPIRATORY:  Clear to auscultation without rales, wheezing or rhonchi  ABDOMEN: Soft, non-tender, non-distended EXTREMITIES:  No edema;  No acute deformity      Assessment and Plan:  Dyspnea on exertion Cardiac PET/CT 06/2024 was consistent with small region of inferior infarction with peri-infarct ischemia as well as mildly to mildly reduced LVEF. Rest EF 40%, Stress EF 49% Echocardiogram 02/2024 with LVEF 55 to 60% - Today he reports ongoing shortness of breath x 2 months with worsening dyspnea on exertion.  Prior chest pains have improved.  Now is experiencing mild bilateral pedal edema - Spoke with DOD Dr. Shlomo regarding patient's case in regard to his most recent abnormal PET stress test.  We agreed to proceed with left heart catheterization for further ischemic evaluation - He does have history of CKD with most recent creatinine 1.68 and GFR 45 on 7/23.  Plan for repeat BMET today.  He will adequately hydrate prior to procedure.  I confirmed he is not taking NSAIDs  - Cardiac catheterization scheduled 9/12 with Dr. Anner at 10:30 AM - Stop Eliquis  2 days prior to procedure.  Stop valsartan -hydrochlorothiazide  1 day prior to procedure  Pulmonary embolism H/o PE in 10/2022 Recently admitted on  02/2024 for PE that was likely provoked in the setting of recent orthopedic surgical intervention Echocardiogram 02/2024 with normal LVEF 55 to 60% Most recent CT angio chest on 05/2024 during ED evaluation for chest pain showed negative examination for pulmonary embolism - Today he denies any leg swelling or hemoptysis - Continue Eliquis  per PCP  COPD No acute exacerbation appreciated today - Continue home bronchodilator regimen  Hypertension Blood pressure today is well-controlled at 106/70 - Continue bisoprolol  5 mg daily and valsartan -hydrochlorothiazide  160-12.5 mg daily   Chronic kidney disease stage IIIb Creatinine 1.68 and GFR 45 on 05/2024 - Remains stable - Avoid nephrotoxic agents   Informed Consent   Shared Decision Making/Informed Consent The risks [stroke (1 in 1000), death (1 in 1000), kidney failure  [usually temporary] (1 in 500), bleeding (1 in 200), allergic reaction [possibly serious] (1 in 200)], benefits (diagnostic support and management of coronary artery disease) and alternatives of a cardiac catheterization were discussed in detail with Aaron Clarke and he is willing to proceed.     Dispo:  Return in about 2 weeks (around 08/07/2024).  Signed, Aaron LITTIE Louis, NP

## 2024-07-25 LAB — CBC
Hematocrit: 48.1 % (ref 37.5–51.0)
Hemoglobin: 15.8 g/dL (ref 13.0–17.7)
MCH: 29.3 pg (ref 26.6–33.0)
MCHC: 32.8 g/dL (ref 31.5–35.7)
MCV: 89 fL (ref 79–97)
Platelets: 197 x10E3/uL (ref 150–450)
RBC: 5.4 x10E6/uL (ref 4.14–5.80)
RDW: 14.1 % (ref 11.6–15.4)
WBC: 7.7 x10E3/uL (ref 3.4–10.8)

## 2024-07-25 LAB — BASIC METABOLIC PANEL WITH GFR
BUN/Creatinine Ratio: 20 (ref 10–24)
BUN: 41 mg/dL — AB (ref 8–27)
CO2: 20 mmol/L (ref 20–29)
Calcium: 9.7 mg/dL (ref 8.6–10.2)
Chloride: 101 mmol/L (ref 96–106)
Creatinine, Ser: 2.1 mg/dL — AB (ref 0.76–1.27)
Glucose: 83 mg/dL (ref 70–99)
Potassium: 4.4 mmol/L (ref 3.5–5.2)
Sodium: 139 mmol/L (ref 134–144)
eGFR: 34 mL/min/1.73 — AB (ref >59)

## 2024-07-27 ENCOUNTER — Ambulatory Visit: Payer: Self-pay | Admitting: Emergency Medicine

## 2024-07-27 DIAGNOSIS — Z79899 Other long term (current) drug therapy: Secondary | ICD-10-CM

## 2024-07-27 DIAGNOSIS — I10 Essential (primary) hypertension: Secondary | ICD-10-CM

## 2024-07-30 DIAGNOSIS — Z79899 Other long term (current) drug therapy: Secondary | ICD-10-CM | POA: Diagnosis not present

## 2024-07-31 ENCOUNTER — Other Ambulatory Visit: Payer: Self-pay | Admitting: Orthopaedic Surgery

## 2024-07-31 LAB — BASIC METABOLIC PANEL WITH GFR
BUN/Creatinine Ratio: 17 (ref 10–24)
BUN: 29 mg/dL — ABNORMAL HIGH (ref 8–27)
CO2: 20 mmol/L (ref 20–29)
Calcium: 9.4 mg/dL (ref 8.6–10.2)
Chloride: 101 mmol/L (ref 96–106)
Creatinine, Ser: 1.67 mg/dL — ABNORMAL HIGH (ref 0.76–1.27)
Glucose: 94 mg/dL (ref 70–99)
Potassium: 4.2 mmol/L (ref 3.5–5.2)
Sodium: 136 mmol/L (ref 134–144)
eGFR: 45 mL/min/1.73 — ABNORMAL LOW (ref >59)

## 2024-08-01 ENCOUNTER — Encounter (HOSPITAL_COMMUNITY)
Admission: RE | Disposition: A | Discharge status: Home / Self Care | Payer: Self-pay | Source: Home / Self Care | Attending: Cardiology

## 2024-08-01 ENCOUNTER — Other Ambulatory Visit: Payer: Self-pay

## 2024-08-01 ENCOUNTER — Ambulatory Visit (HOSPITAL_COMMUNITY)
Admission: RE | Admit: 2024-08-01 | Discharge: 2024-08-01 | Disposition: A | Discharge status: Home / Self Care | Attending: Cardiology | Admitting: Cardiology

## 2024-08-01 DIAGNOSIS — Z7901 Long term (current) use of anticoagulants: Secondary | ICD-10-CM | POA: Insufficient documentation

## 2024-08-01 DIAGNOSIS — I2510 Atherosclerotic heart disease of native coronary artery without angina pectoris: Secondary | ICD-10-CM | POA: Diagnosis not present

## 2024-08-01 DIAGNOSIS — R0609 Other forms of dyspnea: Secondary | ICD-10-CM

## 2024-08-01 DIAGNOSIS — Z79899 Other long term (current) drug therapy: Secondary | ICD-10-CM | POA: Diagnosis not present

## 2024-08-01 DIAGNOSIS — R9439 Abnormal result of other cardiovascular function study: Secondary | ICD-10-CM | POA: Diagnosis present

## 2024-08-01 DIAGNOSIS — N1832 Chronic kidney disease, stage 3b: Secondary | ICD-10-CM | POA: Insufficient documentation

## 2024-08-01 DIAGNOSIS — J4489 Other specified chronic obstructive pulmonary disease: Secondary | ICD-10-CM | POA: Diagnosis not present

## 2024-08-01 DIAGNOSIS — Z86711 Personal history of pulmonary embolism: Secondary | ICD-10-CM | POA: Diagnosis not present

## 2024-08-01 DIAGNOSIS — I129 Hypertensive chronic kidney disease with stage 1 through stage 4 chronic kidney disease, or unspecified chronic kidney disease: Secondary | ICD-10-CM | POA: Insufficient documentation

## 2024-08-01 DIAGNOSIS — G8929 Other chronic pain: Secondary | ICD-10-CM | POA: Diagnosis not present

## 2024-08-01 HISTORY — PX: LEFT HEART CATH AND CORONARY ANGIOGRAPHY: CATH118249

## 2024-08-01 SURGERY — LEFT HEART CATH AND CORONARY ANGIOGRAPHY
Anesthesia: LOCAL

## 2024-08-01 MED ORDER — FENTANYL CITRATE (PF) 100 MCG/2ML IJ SOLN
INTRAMUSCULAR | Status: DC | PRN
  Administered 2024-08-01: 25 ug via INTRAVENOUS

## 2024-08-01 MED ORDER — SODIUM CHLORIDE 0.9 % IV SOLN: 250.0000 mL | INTRAVENOUS | Status: DC | PRN

## 2024-08-01 MED ORDER — ACETAMINOPHEN 325 MG PO TABS: 650.0000 mg | ORAL_TABLET | ORAL | Status: DC | PRN

## 2024-08-01 MED ORDER — SODIUM CHLORIDE 0.9% FLUSH: 3.0000 mL | INTRAVENOUS | Status: DC | PRN

## 2024-08-01 MED ORDER — LIDOCAINE HCL (PF) 1 % IJ SOLN
INTRAMUSCULAR | Status: DC | PRN
  Administered 2024-08-01: 2 mL

## 2024-08-01 MED ORDER — HYDRALAZINE HCL 20 MG/ML IJ SOLN: 10.0000 mg | INTRAMUSCULAR | Status: DC | PRN

## 2024-08-01 MED ORDER — MIDAZOLAM HCL 2 MG/2ML IJ SOLN
INTRAMUSCULAR | Status: AC
  Filled 2024-08-01: qty 2

## 2024-08-01 MED ORDER — LABETALOL HCL 5 MG/ML IV SOLN: 10.0000 mg | INTRAVENOUS | Status: DC | PRN

## 2024-08-01 MED ORDER — HEPARIN SODIUM (PORCINE) 1000 UNIT/ML IJ SOLN
INTRAMUSCULAR | Status: DC | PRN
  Administered 2024-08-01: 6000 [IU] via INTRAVENOUS

## 2024-08-01 MED ORDER — SODIUM CHLORIDE 0.9 % WEIGHT BASED INFUSION
1.0000 mL/kg/h | INTRAVENOUS | Status: DC
  Administered 2024-08-01: 250 mL via INTRAVENOUS

## 2024-08-01 MED ORDER — HEPARIN SODIUM (PORCINE) 1000 UNIT/ML IJ SOLN
INTRAMUSCULAR | Status: AC
  Filled 2024-08-01: qty 10

## 2024-08-01 MED ORDER — SODIUM CHLORIDE 0.9 % WEIGHT BASED INFUSION
3.0000 mL/kg/h | INTRAVENOUS | Status: AC
  Administered 2024-08-01: 3 mL/kg/h via INTRAVENOUS

## 2024-08-01 MED ORDER — ONDANSETRON HCL 4 MG/2ML IJ SOLN: 4.0000 mg | Freq: Four times a day (QID) | INTRAMUSCULAR | Status: DC | PRN

## 2024-08-01 MED ORDER — FENTANYL CITRATE (PF) 100 MCG/2ML IJ SOLN
INTRAMUSCULAR | Status: AC
  Filled 2024-08-01: qty 2

## 2024-08-01 MED ORDER — ASPIRIN 81 MG PO CHEW
81.0000 mg | CHEWABLE_TABLET | ORAL | Status: AC
  Administered 2024-08-01: 81 mg via ORAL
  Filled 2024-08-01: qty 1

## 2024-08-01 MED ORDER — FREE WATER: 500.0000 mL | Freq: Once | Status: DC

## 2024-08-01 MED ORDER — VERAPAMIL HCL 2.5 MG/ML IV SOLN
INTRAVENOUS | Status: AC
  Filled 2024-08-01: qty 2

## 2024-08-01 MED ORDER — VERAPAMIL HCL 2.5 MG/ML IV SOLN
INTRAVENOUS | Status: DC | PRN
  Administered 2024-08-01: 10 mL via INTRA_ARTERIAL

## 2024-08-01 MED ORDER — IOHEXOL 350 MG/ML SOLN
INTRAVENOUS | Status: DC | PRN
  Administered 2024-08-01: 52 mL via INTRA_ARTERIAL

## 2024-08-01 MED ORDER — LIDOCAINE HCL (PF) 1 % IJ SOLN
INTRAMUSCULAR | Status: AC
  Filled 2024-08-01: qty 30

## 2024-08-01 MED ORDER — HEPARIN (PORCINE) IN NACL 1000-0.9 UT/500ML-% IV SOLN
INTRAVENOUS | Status: DC | PRN
  Administered 2024-08-01 (×2): 500 mL

## 2024-08-01 MED ORDER — MIDAZOLAM HCL 2 MG/2ML IJ SOLN
INTRAMUSCULAR | Status: DC | PRN
  Administered 2024-08-01: 2 mg via INTRAVENOUS

## 2024-08-01 MED ORDER — ASPIRIN 81 MG PO CHEW: 81.0000 mg | CHEWABLE_TABLET | ORAL | Status: DC

## 2024-08-01 MED ORDER — SODIUM CHLORIDE 0.9% FLUSH: 3.0000 mL | Freq: Two times a day (BID) | INTRAVENOUS | Status: DC

## 2024-08-01 SURGICAL SUPPLY — 10 items
CATH INFINITI AMBI 5FR TG (CATHETERS) IMPLANT
CATH INFINITI JR4 5F (CATHETERS) IMPLANT
COVER PRB 48X5XTLSCP FOLD TPE (BAG) IMPLANT
DEVICE RAD COMP TR BAND LRG (VASCULAR PRODUCTS) IMPLANT
GLIDESHEATH SLEND SS 6F .021 (SHEATH) IMPLANT
GUIDEWIRE INQWIRE 1.5J.035X260 (WIRE) IMPLANT
KIT SINGLE USE MANIFOLD (KITS) IMPLANT
KIT SYRINGE INJ CVI SPIKEX1 (MISCELLANEOUS) IMPLANT
PACK CARDIAC CATHETERIZATION (CUSTOM PROCEDURE TRAY) ×1 IMPLANT
SET ATX-X65L (MISCELLANEOUS) IMPLANT

## 2024-08-01 NOTE — Discharge Instructions (Signed)

## 2024-08-01 NOTE — Interval H&P Note (Signed)
 History and Physical Interval Note:  08/01/2024 9:23 AM  Aaron Clarke  has presented today for surgery, with the diagnosis of abnormal stress test.  The various methods of treatment have been discussed with the patient and family. After consideration of risks, benefits and other options for treatment, the patient has consented to  Procedure(s): LEFT HEART CATH AND CORONARY ANGIOGRAPHY (N/A)  PERCUTANEOUS CORONARY INTERVENTION   as a surgical intervention.  The patient's history has been reviewed, patient examined, no change in status, stable for surgery.  I have reviewed the patient's chart and labs.  Questions were answered to the patient's satisfaction.     Alm Clay

## 2024-08-01 NOTE — Interval H&P Note (Signed)
 History and Physical Interval Note:  08/01/2024 9:23 AM Cath Lab Visit (complete for each Cath Lab visit)  Clinical Evaluation Leading to the Procedure:   ACS: No.  Non-ACS:    Anginal Classification: CCS I  Anti-ischemic medical therapy: Minimal Therapy (1 class of medications)  Non-Invasive Test Results: Intermediate-risk stress test findings: cardiac mortality 1-3%/year  Prior CABG: No previous CABG     Aaron Clarke

## 2024-08-01 NOTE — Progress Notes (Signed)
 Discharge instructions reviewed with pt and significant other at bedside. Denies questions or concerns. TR band removed, no s/s of complications Was able to ambulate to the bathroom, and void without difficulty. PT escorted off the unit via wheel chair to personal vehicle.

## 2024-08-02 ENCOUNTER — Encounter (HOSPITAL_COMMUNITY): Payer: Self-pay | Admitting: Cardiology

## 2024-08-08 ENCOUNTER — Telehealth: Payer: Self-pay | Admitting: *Deleted

## 2024-08-08 NOTE — Telephone Encounter (Signed)
 Copied from CRM [id number]. Topic: Appointments - Scheduling Inquiry for Clinic >> Aug 07, 2024 11:05 AM Leila C wrote: Reason for CRM:  Patient states was in the hospital for hip surgery and did not do sleep study. Patient would like to do the sleep study now. Unable to reach CAL [facility], per CAL Willacy send crm to admin. Please advise and call back [phone].      ----------------------------------------------------------------------- From previous Reason for Contact - Lab/Test Order Request: Reason for CRM:

## 2024-08-08 NOTE — Telephone Encounter (Signed)
 Referral has been resent to [facility] and they should be reaching out to him. LVM with pt informing him. NFN

## 2024-08-18 ENCOUNTER — Ambulatory Visit: Admitting: Emergency Medicine

## 2024-08-18 NOTE — Progress Notes (Deleted)
 Cardiology Office Note:    Date:  08/18/2024  ID:  Aaron Clarke, DOB 1956/08/24, MRN [id number] PCP: Kayla Jeoffrey RAMAN, FNP  [facility] Providers Cardiologist:  Alm Clay, MD Cardiology APP:  Rana Lum CROME, NP { Click to update primary MD,subspecialty MD or APP then REFRESH:1}    {Click to Open Review  :1}   Patient Profile:       Chief Complaint: *** History of Present Illness:  Aaron Clarke is a 68 y.o. male with visit-pertinent history of asthma, chronic back pain, hypertension, CKD stage II, coronary artery disease   Patient was first evaluated by cardiology on 10/23/2022 while in the ED for elevated troponin.  He reported left first 3 digit hand pain.  Denied any arm pain or chest pain.  Troponins were elevated at 157, 199, 164.  CTA chest showed bilateral segmental and subsegmental pulmonary emboli and RV distention with cardiomegaly.  Troponin leak attributed to hypoxia with PE.    He was recently admitted on 03/11/2024 for pulmonary embolism.  Likely provoked in the setting of recent orthopedic surgical intervention.  Troponin elevation likely demand ischemia in the setting of pulmonary embolism.  Echocardiogram on 03/14/2024 showed LVEF 55 to 60%, moderate LVH, indeterminate diastolic parameters, normal RV function and size, left atrial size moderately dilated, mild calcification of aortic valve.   He was seen in the ED on 05/25/2024 for chest pain.  EKG was without acute changes.  His troponins were negative.  He had a CTA of his chest that showed no pulmonary embolism.  He was to follow-up with cardiology service.   He was seen for follow-up on 06/08/2024.  He reported chest pains that were sharp and right-sided for at least over a month.  The pain was movement induced and has improved over the past several weeks.  He had radiation of pain across his chest to his left arm.  He denied any exertional chest pains during activities including his weekly gym  workouts.   He underwent cardiac PET/CT on 07/10/2024 showing small defect with mild reduction in uptake present in the mid inferior location that was partially reversible consistent with infarction and peri-infarct ischemia.  Rest EF 40% and stress EF 49%.  He underwent cardiac catheterization on 08/01/2024 showing cloacal left main with mostly minimal CAD.  Trivial PDA and PL branches on relatively normal caliber RCA.  Mid LAD to distal LAD lesion is 40% stenosis with diffuse myocardial bridging.  LVEDP is normal.  Discussed the use of AI scribe software for clinical note transcription with the patient, who gave verbal consent to proceed.  History of Present Illness     Review of systems:  Please see the history of present illness. All other systems are reviewed and otherwise negative. ***      Studies Reviewed:        ***  Risk Assessment/Calculations:   {Does this patient have ATRIAL FIBRILLATION?[phone]} No BP recorded.  {Refresh Note OR Click here to enter BP  :1}***        Physical Exam:   VS:  There were no vitals taken for this visit.   Wt Readings from Last 3 Encounters:  08/01/24 (!) 304 lb (137.9 kg)  07/24/24 (!) 306 lb (138.8 kg)  06/11/24 (!) 306 lb (138.8 kg)    GEN: Well nourished, well developed in no acute distress NECK: No JVD; No carotid bruits CARDIAC: ***RRR, no murmurs, rubs, gallops RESPIRATORY:  Clear to auscultation without rales, wheezing or rhonchi  ABDOMEN: Soft, non-tender, non-distended EXTREMITIES:  No edema; No acute deformity ***      Assessment and Plan:    Assessment and Plan Assessment & Plan      {Are you ordering a CV Procedure (e.g. stress test, cath, DCCV, TEE, etc)?   Press F2        :[id number]}  Dispo:  No follow-ups on file.  Signed, Lum LITTIE Louis, NP

## 2024-08-22 ENCOUNTER — Encounter: Payer: Self-pay | Admitting: Emergency Medicine

## 2024-08-22 ENCOUNTER — Ambulatory Visit: Attending: Emergency Medicine | Admitting: Emergency Medicine

## 2024-08-22 VITALS — BP 104/70 | HR 86 | Ht 72.0 in | Wt 314.0 lb

## 2024-08-22 DIAGNOSIS — E785 Hyperlipidemia, unspecified: Secondary | ICD-10-CM | POA: Diagnosis not present

## 2024-08-22 DIAGNOSIS — I2510 Atherosclerotic heart disease of native coronary artery without angina pectoris: Secondary | ICD-10-CM

## 2024-08-22 DIAGNOSIS — J449 Chronic obstructive pulmonary disease, unspecified: Secondary | ICD-10-CM

## 2024-08-22 DIAGNOSIS — Z86711 Personal history of pulmonary embolism: Secondary | ICD-10-CM

## 2024-08-22 DIAGNOSIS — I10 Essential (primary) hypertension: Secondary | ICD-10-CM

## 2024-08-22 DIAGNOSIS — Z79899 Other long term (current) drug therapy: Secondary | ICD-10-CM | POA: Diagnosis not present

## 2024-08-22 DIAGNOSIS — N1831 Chronic kidney disease, stage 3a: Secondary | ICD-10-CM

## 2024-08-22 MED ORDER — ROSUVASTATIN CALCIUM 10 MG PO TABS: 10.0000 mg | ORAL_TABLET | Freq: Every day | ORAL | 2 refills | Status: AC

## 2024-08-22 NOTE — Patient Instructions (Signed)
 Medication Instructions:  START TAKING ROSUVASTATIN CALCIUM 10 MG DAILY.  Lab Work: FASTING LIPID PANEL TO BE DONE IN 8 WEEKS.  Testing/Procedures: NONE  Follow-Up: At [facility], you and your health needs are our priority.  As part of our continuing mission to provide you with exceptional heart care, our providers are all part of one team.  This team includes your primary Cardiologist (physician) and Advanced Practice Providers or APPs (Physician Assistants and Nurse Practitioners) who all work together to provide you with the care you need, when you need it.  Your next appointment:   6 MONTHS  Provider:   Alm Clay, MD

## 2024-08-22 NOTE — Progress Notes (Signed)
 Cardiology Office Note:    Date:  08/22/2024  ID:  Aaron Clarke, DOB 04/22/1956, MRN [id number] PCP: Kayla Jeoffrey RAMAN, FNP  [facility] Providers Cardiologist:  Alm Clay, MD Cardiology APP:  Rana Lum CROME, NP       Patient Profile:       Chief Complaint: Follow-up cardiac catheterization History of Present Illness:  Aaron Clarke is a 68 y.o. male with visit-pertinent history of asthma, chronic back pain, hypertension, CKD stage II, coronary artery disease   Patient was first evaluated by cardiology on 10/23/2022 while in the ED for elevated troponin.  He reported left first 3 digit hand pain.  Denied any arm pain or chest pain.  Troponins were elevated at 157, 199, 164.  CTA chest showed bilateral segmental and subsegmental pulmonary emboli and RV distention with cardiomegaly.  Troponin leak attributed to hypoxia with PE.    He was recently admitted on 03/11/2024 for pulmonary embolism.  Likely provoked in the setting of recent orthopedic surgical intervention.  Troponin elevation likely demand ischemia in the setting of pulmonary embolism.  Echocardiogram on 03/14/2024 showed LVEF 55 to 60%, moderate LVH, indeterminate diastolic parameters, normal RV function and size, left atrial size moderately dilated, mild calcification of aortic valve.   He was seen in the ED on 05/25/2024 for chest pain.  EKG was without acute changes.  His troponins were negative.  He had a CTA of his chest that showed no pulmonary embolism.  He was to follow-up with cardiology service.   He was seen for follow-up on 06/08/2024.  He reported chest pains that were sharp and right-sided for at least over a month.  The pain was movement induced and has improved over the past several weeks.  He had radiation of pain across his chest to his left arm.  He denied any exertional chest pains during activities including his weekly gym workouts.   He underwent cardiac PET/CT on 07/10/2024 showing small  defect with mild reduction in uptake present in the mid inferior location that was partially reversible consistent with infarction and peri-infarct ischemia.  Rest EF 40% and stress EF 49%.  He underwent cardiac catheterization on 08/01/2024 showing cloacal left main with mostly minimal CAD.  Trivial PDA and PL branches on relatively normal caliber RCA.  Mid LAD to distal LAD lesion is 40% stenosis with diffuse myocardial bridging.  LVEDP is normal.   Discussed the use of AI scribe software for clinical note transcription with the patient, who gave verbal consent to proceed.  History of Present Illness Aaron Clarke is a 68 year old male with COPD and minimal coronary artery disease who presents for follow-up after recent cardiac catheterization.  Today patient is doing well without acute cardiovascular concerns.  Reports he no longer has any chest pains.  Stays relatively active at work and goes to the gym occasionally without any exertional chest pains.  Shortness of breath persists, primarily on exertion, however has had improvement.  He experiences leg fatigue and numbness in the area of his recent right hip replacement.  He is following with orthopedics with this.  He self discontinued Zepbound  due to side effects of diarrhea and cramping. His weight fluctuates between 302 and 315 pounds, and he exercises at the gym about once every other week.  Occasional ankle swelling is present but improves with leg elevation.  No orthopnea, PND, lightheadedness, dizziness, syncope, presyncope, palpitations.  He is scheduled for lab work on October 8th and a follow-up appointment on  October 15th with his PCP.    Review of systems:  Please see the history of present illness. All other systems are reviewed and otherwise negative.      Studies Reviewed:    EKG Interpretation Date/Time:  Friday August 22 2024 09:59:22 EDT Ventricular Rate:  86 PR Interval:  142 QRS Duration:  110 QT  Interval:  384 QTC Calculation: 459 R Axis:   43  Text Interpretation: Normal sinus rhythm Normal ECG When compared with ECG of 01-Aug-2024 06:57, No significant change was found Confirmed by Rana Dixon [phone]) on 08/22/2024 10:20:21 AM    Cardiac catheterization 08/01/2024   Cloacal Left Main with mostly minimal CAD.  Trivial PDA and PL branches on a relatively normal caliber RCA   Mid LAD to Dist LAD lesion is 40% stenosed -diffuse myocardial bridging.   LV end diastolic pressure is normal.   In the absence of any other complications or medical issues, we expect the patient to be ready for discharge from a cath perspective on 08/01/2024.   Consider evaluating for noncardiac etiology for chest pain.  I suspect the stress test was false positive.   No indication for antiplatelet therapy at this time . Diagnostic Dominance: Right    Risk Assessment/Calculations:              Physical Exam:   VS:  BP 104/70 (BP Location: Left Arm, Patient Position: Sitting, Cuff Size: Normal)   Pulse 86   Ht 6' (1.829 m)   Wt (!) 314 lb (142.4 kg)   BMI 42.59 kg/m    Wt Readings from Last 3 Encounters:  08/22/24 (!) 314 lb (142.4 kg)  08/01/24 (!) 304 lb (137.9 kg)  07/24/24 (!) 306 lb (138.8 kg)    GEN: Well nourished, well developed in no acute distress NECK: No JVD; No carotid bruits CARDIAC: RRR, no murmurs, rubs, gallops RESPIRATORY:  Clear to auscultation without rales, wheezing or rhonchi  ABDOMEN: Soft, non-tender, non-distended EXTREMITIES:  No edema; No acute deformity      Assessment and Plan:  Minimal coronary artery disease Dyspnea on exertion Cardiac PET/CT 06/2024 consistent with small region of inferior infarction with peri-infarct ischemia with reduced LVEF.  Rest EF 40%, Stress EF 49% Cardiac catheterization 07/2024 showing cloacal left main with mostly minimal CAD.  Trivial PDA and PL branches on a relatively normal caliber RCA.  Mid LAD to distal LAD lesion 40%  stenosed with diffuse myocardial bridging - EKG today without acute ischemic changes - Today patient is without chest pains.  Notes dyspnea on exertion has improved.  He is able to work and go to the gym without exertional symptoms.  No indication for further ischemic evaluation at this time - Will start rosuvastatin 10 mg daily to reach an LDL goal of less than 70 - No indication for ASA.  He is on Eliquis  daily  Hyperlipidemia, LDL goal <70 LDL 80 on 11/2023 - Start rosuvastatin 10 mg daily - Repeat fasting lipid panel/LFTs x 8 weeks  Pulmonary embolism H/o PE in 10/2022 Admitted on 02/2024 for PE that was likely provoked in the setting of recent orthopedic surgical intervention Echocardiogram 02/2024 with normal LVEF 55 to 60% Most recent CT angio chest on 05/2024 negative examination for pulmonary embolism - Today he denies any leg swelling, leg pain, or hemoptysis - Continue Eliquis  per PCP   COPD Asthma No acute exacerbation appreciated today - Continue home bronchodilator regimen - Followed by Dr. Darlean with pulmonology   Hypertension  Blood pressure today is well-controlled at 104/70 - Continue bisoprolol  5 mg daily and valsartan -hydrochlorothiazide  160-12.5 mg daily   Chronic kidney disease stage IIIa Creatinine 1.67 and GFR 45 on 07/2024 - Remains stable - Avoid nephrotoxic agents - He will be getting BMET next week with his PCP      Dispo:  Return in about 6 months (around 02/20/2025).  Signed, Lum LITTIE Louis, NP

## 2024-08-27 ENCOUNTER — Other Ambulatory Visit

## 2024-08-27 DIAGNOSIS — N1831 Chronic kidney disease, stage 3a: Secondary | ICD-10-CM

## 2024-08-27 DIAGNOSIS — I10 Essential (primary) hypertension: Secondary | ICD-10-CM

## 2024-08-28 ENCOUNTER — Other Ambulatory Visit

## 2024-08-28 DIAGNOSIS — E66812 Obesity, class 2: Secondary | ICD-10-CM

## 2024-08-28 DIAGNOSIS — N1831 Chronic kidney disease, stage 3a: Secondary | ICD-10-CM

## 2024-08-28 DIAGNOSIS — I10 Essential (primary) hypertension: Secondary | ICD-10-CM | POA: Diagnosis not present

## 2024-08-30 ENCOUNTER — Other Ambulatory Visit: Payer: Self-pay | Admitting: Internal Medicine

## 2024-08-30 DIAGNOSIS — I10 Essential (primary) hypertension: Secondary | ICD-10-CM

## 2024-09-01 ENCOUNTER — Ambulatory Visit: Payer: Self-pay | Admitting: Family Medicine

## 2024-09-03 ENCOUNTER — Encounter: Payer: Self-pay | Admitting: Family Medicine

## 2024-09-03 ENCOUNTER — Encounter

## 2024-09-03 ENCOUNTER — Ambulatory Visit: Admitting: Family Medicine

## 2024-09-03 VITALS — BP 110/72 | HR 77 | Temp 98.7°F | Ht 72.0 in | Wt 310.4 lb

## 2024-09-03 DIAGNOSIS — N1831 Chronic kidney disease, stage 3a: Secondary | ICD-10-CM | POA: Diagnosis not present

## 2024-09-03 DIAGNOSIS — K219 Gastro-esophageal reflux disease without esophagitis: Secondary | ICD-10-CM | POA: Diagnosis not present

## 2024-09-03 DIAGNOSIS — I10 Essential (primary) hypertension: Secondary | ICD-10-CM

## 2024-09-03 DIAGNOSIS — Z86711 Personal history of pulmonary embolism: Secondary | ICD-10-CM | POA: Diagnosis not present

## 2024-09-03 DIAGNOSIS — J45909 Unspecified asthma, uncomplicated: Secondary | ICD-10-CM | POA: Diagnosis not present

## 2024-09-03 DIAGNOSIS — Z23 Encounter for immunization: Secondary | ICD-10-CM | POA: Diagnosis not present

## 2024-09-03 DIAGNOSIS — G4733 Obstructive sleep apnea (adult) (pediatric): Secondary | ICD-10-CM

## 2024-09-03 NOTE — Assessment & Plan Note (Signed)
Avoid NSAIDs, stay hydrated 

## 2024-09-03 NOTE — Assessment & Plan Note (Signed)
 Diet controlled. Elevated HOB if needed and avoid lying down 2-3 hours after eating, avoid coffee, alcohol, chocolate, fatty foods, citrus, carbonated beverages, spicy foods, late meals, and smoking. Return to office if symptoms return or worsen and seek medical care for difficulty swallowing, bleeding, anemia, weight loss, or recurrent vomiting.

## 2024-09-03 NOTE — Assessment & Plan Note (Addendum)
 Continue Eliquis , followed by cardiology and pulm, no bleeding

## 2024-09-03 NOTE — Progress Notes (Signed)
 Subjective:  HPI: Aaron Clarke is a 68 y.o. male presenting on 09/03/2024 for Medical Management of Chronic Issues (Chronic f/u /Still having chronic back pain after walking for long periods )   HPI Patient is in today for chronic condition management.  PMH includes asthma, HTN, CKD 3a, osteoarthritis, morbid obesity, PEs, CAD Followed by Cardiology, Pulmonology, and Orthopedics.   Asthma: Symbicort  daily, Albuterol  PRN 1x weekly  History of pulmonary embolism: on Eliquis  5mg  BID  HTN: on bisoprolol  5mg  daily, valsartan -hydrochlorothiazide  160-12.5mg  daily Denies chest pain, palpitations, recurrent headaches, vision changes, lightheadedness, dizziness, dyspnea on exertion, or swelling of extremities.  S/p right hip replacement: pain well controlled on celebrex  200mg  daily, robaxin  500mg  a6h PRN  GERD: diet controlled  HLD: on Rosuvastatin 10mg  daily, goal LDL <70 Lipid Panel     Component Value Date/Time   CHOL 99 08/28/2024 0919   TRIG 96 08/28/2024 0919   HDL 41 08/28/2024 0919   CHOLHDL 2.4 08/28/2024 0919   VLDL 6 10/24/2022 0347   LDLCALC 40 08/28/2024 0919    CKD 3a: styaing hydrated, avoiding NSAIDs, most recent GFR 45 Lab Results  Component Value Date   CREATININE 1.68 (H) 08/28/2024   CREATININE 1.67 (H) 07/30/2024   CREATININE 2.10 (H) 07/24/2024   CAD: no chest pain, at LDL goal, BP well controlled, adding A1c. On Eliquis   Discussed the use of AI scribe software for clinical note transcription with the patient, who gave verbal consent to proceed.  History of Present Illness Aaron Clarke is a 68 year old male with osteoarthritis and asthma who presents for follow-up of his chronic conditions.  He experiences back pain when walking, which is now less severe and localized to one spot in the middle of his back. He maintains a 30-minute workout routine at the gym, including leg weights and benches.  His asthma is well controlled with albuterol  as  needed, approximately once a week, and daily Symbicort . He does not report waking up at night with asthma symptoms more than twice a month.  He is on Eliquis  5 mg twice daily for a history of blood clots, which he describes as having had 'a whole bunch of them at one time.' He is unsure of the duration of this treatment. He also takes bisoprolol  5 mg daily for hypertension, which has been stable.  He takes Celebrex  200 mg daily for osteoarthritis pain management. He previously used Robaxin  (methocarbamol ) but considers it an old prescription.  He recently started rosuvastatin for hyperlipidemia. He discontinued Zepbound  due to gastrointestinal side effects, including diarrhea and stomach pain.  No chest pain, palpitations, headaches, or vision changes. Occasional ankle swelling, particularly when standing for extended periods, but no significant swelling today.  No gastrointestinal upset aside from the issues with Zepbound . He avoids ibuprofen or NSAIDs due to kidney concerns and stays hydrated.       Review of Systems  All other systems reviewed and are negative.   Relevant past medical history reviewed and updated as indicated.   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   Family history of breast cancer    mother   Hypertension    Osteoarthritis of right hip    PE (pulmonary thromboembolism) (HCC) 10/23/2022     Past Surgical History:  Procedure Laterality Date   CHOLECYSTECTOMY  12/19/2011  Procedure: LAPAROSCOPIC CHOLECYSTECTOMY WITH INTRAOPERATIVE CHOLANGIOGRAM;  Surgeon: Elon CHRISTELLA Pacini, MD;  Location: [facility] OR;  Service: General;  Laterality: N/A;   COLONOSCOPY WITH PROPOFOL  N/A 09/13/2022   Procedure: COLONOSCOPY WITH PROPOFOL ;  Surgeon: Unk Corinn Skiff, MD;  Location: [facility] ENDOSCOPY;  Service: Gastroenterology;  Laterality: N/A;    Colonscopy     Fatty tumor  2003   L chest   LEFT HEART CATH AND CORONARY ANGIOGRAPHY N/A 08/01/2024   Procedure: LEFT HEART CATH AND CORONARY ANGIOGRAPHY;  Surgeon: Anner Alm ORN, MD;  Location: [facility] INVASIVE CV LAB;  Service: Cardiovascular;  Laterality: N/A;   TOTAL HIP ARTHROPLASTY Right 02/19/2024   Procedure: ARTHROPLASTY, HIP, TOTAL, ANTERIOR APPROACH;  Surgeon: Vernetta Lonni GRADE, MD;  Location: [facility] OR;  Service: Orthopedics;  Laterality: Right;    Allergies and medications reviewed and updated.   Current Outpatient Medications:    acetaminophen  (TYLENOL ) 500 MG tablet, Take 500-1,000 mg by mouth every 6 (six) hours as needed (pain.)., Disp: , Rfl:    albuterol  (PROVENTIL ) (2.5 MG/3ML) 0.083% nebulizer solution, Take 3 mLs (2.5 mg total) by nebulization every 6 (six) hours as needed for wheezing or shortness of breath., Disp: 75 mL, Rfl: 0   albuterol  (VENTOLIN  HFA) 108 (90 Base) MCG/ACT inhaler, Inhale 1-2 puffs into the lungs every 6 (six) hours as needed for wheezing or shortness of breath., Disp: 8 g, Rfl: 0   apixaban  (ELIQUIS ) 5 MG TABS tablet, Take 2 tablets (10 mg total) by mouth 2 (two) times daily for 7 days, THEN 1 tablet (5 mg total) 2 (two) times daily. (Patient taking differently: Take 1 tablet (5 mg) by mouth 2 (two) times daily.), Disp: 60 tablet, Rfl: 6   bisoprolol  (ZEBETA ) 5 MG tablet, Take 1 tablet (5 mg total) by mouth daily., Disp: 30 tablet, Rfl: 11   budesonide -formoterol  (SYMBICORT ) 160-4.5 MCG/ACT inhaler, Take 2 puffs first thing in am and then another 2 puffs about 12 hours later., Disp: 1 each, Rfl: 11   celecoxib  (CELEBREX ) 200 MG capsule, TAKE 1 CAPSULE (200 MG TOTAL) BY MOUTH 2 (TWO) TIMES DAILY BETWEEN MEALS AS NEEDED., Disp: 60 capsule, Rfl: 1   methocarbamol  (ROBAXIN ) 500 MG tablet, Take 1 tablet (500 mg total) by mouth every 6 (six) hours as needed for muscle spasms., Disp: 30 tablet, Rfl: 1   pantoprazole  (PROTONIX ) 40 MG tablet, Take 40 mg by mouth  daily before breakfast., Disp: , Rfl:    rosuvastatin (CRESTOR) 10 MG tablet, Take 1 tablet (10 mg total) by mouth daily., Disp: 90 tablet, Rfl: 2   valsartan -hydrochlorothiazide  (DIOVAN  HCT) 160-12.5 MG tablet, Take 1 tablet by mouth daily., Disp: 90 tablet, Rfl: 0   tirzepatide  (ZEPBOUND ) 2.5 MG/0.5ML injection vial, Inject 2.5 mg into the skin once a week. (Patient not taking: Reported on 09/03/2024), Disp: 2 mL, Rfl: 0  No Known Allergies  Objective:   BP 110/72   Pulse 77   Temp 98.7 F (37.1 C)   Ht 6' (1.829 m)   Wt (!) 310 lb 6.4 oz (140.8 kg)   SpO2 96%   BMI 42.10 kg/m      09/03/2024    3:58 PM 08/22/2024    9:50 AM 08/01/2024   11:51 AM  Vitals with BMI  Height 6' 0 6' 0   Weight 310 lbs 6 oz 314 lbs   BMI 42.09 42.58   Systolic 110 104 861  Diastolic 72 70 80  Pulse 77 86 86     Physical Exam  Vitals and nursing note reviewed.  Constitutional:      Appearance: Normal appearance. He is obese.  HENT:     Head: Normocephalic and atraumatic.  Cardiovascular:     Rate and Rhythm: Normal rate and regular rhythm.     Pulses: Normal pulses.     Heart sounds: Normal heart sounds.  Pulmonary:     Effort: Pulmonary effort is normal.     Breath sounds: Normal breath sounds.  Skin:    General: Skin is warm and dry.     Capillary Refill: Capillary refill takes less than 2 seconds.  Neurological:     General: No focal deficit present.     Mental Status: He is alert and oriented to person, place, and time. Mental status is at baseline.  Psychiatric:        Mood and Affect: Mood normal.        Behavior: Behavior normal.        Thought Content: Thought content normal.        Judgment: Judgment normal.     Assessment & Plan:  Need for vaccination -     Flu vaccine HIGH DOSE PF(Fluzone Trivalent) -     Varicella-zoster vaccine IM  Essential hypertension Assessment & Plan: Followed by cardiology, continue bisoprolol  5mg  daily, valsartan -hydrochlorothiazide   160-12.5mg  daily Recommend heart healthy diet such as Mediterranean diet with whole grains, fruits, vegetable, fish, lean meats, nuts, and olive oil. Limit salt. Encouraged moderate walking, 3-5 times/week for 30-50 minutes each session. Aim for at least 150 minutes.week. Goal should be pace of 3 miles/hours, or walking 1.5 miles in 30 minutes. Avoid tobacco products. Avoid excess alcohol. Take medications as prescribed and bring medications and blood pressure log with cuff to each office visit. Seek medical care for chest pain, palpitations, shortness of breath with exertion, dizziness/lightheadedness, vision changes, recurrent headaches, or swelling of extremities.    Gastroesophageal reflux disease, unspecified whether esophagitis present Assessment & Plan: Diet controlled. Elevated HOB if needed and avoid lying down 2-3 hours after eating, avoid coffee, alcohol, chocolate, fatty foods, citrus, carbonated beverages, spicy foods, late meals, and smoking. Return to office if symptoms return or worsen and seek medical care for difficulty swallowing, bleeding, anemia, weight loss, or recurrent vomiting.     Mild asthma without complication, unspecified whether persistent Assessment & Plan: Followed by Pulmonology. Continue Symbicort  and Albuterol . Discussed rule of 2s   History of pulmonary embolism Assessment & Plan: Continue Eliquis , followed by cardiology and pulm, no bleeding   Stage 3a chronic kidney disease (HCC) Assessment & Plan: Avoid NSAIDs, stay hydrated      Follow up plan: Return in about 3 months (around 12/04/2024) for annual physical with labs 1 week prior.  Jeoffrey GORMAN Barrio, FNP

## 2024-09-03 NOTE — Assessment & Plan Note (Signed)
 Followed by cardiology, continue bisoprolol  5mg  daily, valsartan -hydrochlorothiazide  160-12.5mg  daily Recommend heart healthy diet such as Mediterranean diet with whole grains, fruits, vegetable, fish, lean meats, nuts, and olive oil. Limit salt. Encouraged moderate walking, 3-5 times/week for 30-50 minutes each session. Aim for at least 150 minutes.week. Goal should be pace of 3 miles/hours, or walking 1.5 miles in 30 minutes. Avoid tobacco products. Avoid excess alcohol. Take medications as prescribed and bring medications and blood pressure log with cuff to each office visit. Seek medical care for chest pain, palpitations, shortness of breath with exertion, dizziness/lightheadedness, vision changes, recurrent headaches, or swelling of extremities.

## 2024-09-03 NOTE — Assessment & Plan Note (Signed)
 Followed by Pulmonology. Continue Symbicort  and Albuterol . Discussed rule of 2s

## 2024-09-05 LAB — CBC WITH DIFFERENTIAL/PLATELET
Absolute Lymphocytes: 1836 {cells}/uL (ref 850–3900)
Absolute Monocytes: 655 {cells}/uL (ref 200–950)
Basophils Absolute: 51 {cells}/uL (ref 0–200)
Basophils Relative: 0.6 %
Eosinophils Absolute: 281 {cells}/uL (ref 15–500)
Eosinophils Relative: 3.3 %
HCT: 44.8 % (ref 38.5–50.0)
Hemoglobin: 15.3 g/dL (ref 13.2–17.1)
MCH: 30.5 pg (ref 27.0–33.0)
MCHC: 34.2 g/dL (ref 32.0–36.0)
MCV: 89.2 fL (ref 80.0–100.0)
MPV: 12.3 fL (ref 7.5–12.5)
Monocytes Relative: 7.7 %
Neutro Abs: 5678 {cells}/uL (ref 1500–7800)
Neutrophils Relative %: 66.8 %
Platelets: 163 Thousand/uL (ref 140–400)
RBC: 5.02 Million/uL (ref 4.20–5.80)
RDW: 13.8 % (ref 11.0–15.0)
Total Lymphocyte: 21.6 %
WBC: 8.5 Thousand/uL (ref 3.8–10.8)

## 2024-09-05 LAB — HEMOGLOBIN A1C
Hgb A1c MFr Bld: 5.5 % (ref <5.7)
Mean Plasma Glucose: 111 mg/dL
eAG (mmol/L): 6.2 mmol/L

## 2024-09-05 LAB — TEST AUTHORIZATION

## 2024-09-05 LAB — LIPID PANEL
Cholesterol: 99 mg/dL (ref <200)
HDL: 41 mg/dL (ref >=40)
LDL Cholesterol (Calc): 40 mg/dL
Non-HDL Cholesterol (Calc): 58 mg/dL (ref <130)
Total CHOL/HDL Ratio: 2.4 (calc) (ref <5.0)
Triglycerides: 96 mg/dL (ref <150)

## 2024-09-05 LAB — BASIC METABOLIC PANEL WITHOUT GFR
BUN/Creatinine Ratio: 19 (calc) (ref 6–22)
BUN: 32 mg/dL — ABNORMAL HIGH (ref 7–25)
CO2: 28 mmol/L (ref 20–32)
Calcium: 9.7 mg/dL (ref 8.6–10.3)
Chloride: 106 mmol/L (ref 98–110)
Creat: 1.68 mg/dL — ABNORMAL HIGH (ref 0.70–1.35)
Glucose, Bld: 113 mg/dL — ABNORMAL HIGH (ref 65–99)
Potassium: 4.9 mmol/L (ref 3.5–5.3)
Sodium: 140 mmol/L (ref 135–146)

## 2024-09-06 ENCOUNTER — Other Ambulatory Visit: Payer: Self-pay | Admitting: Internal Medicine

## 2024-09-11 ENCOUNTER — Ambulatory Visit: Admitting: Emergency Medicine

## 2024-09-11 ENCOUNTER — Ambulatory Visit: Payer: Self-pay

## 2024-09-11 DIAGNOSIS — M25551 Pain in right hip: Secondary | ICD-10-CM | POA: Diagnosis not present

## 2024-09-11 DIAGNOSIS — R0602 Shortness of breath: Secondary | ICD-10-CM | POA: Diagnosis not present

## 2024-09-11 DIAGNOSIS — I890 Lymphedema, not elsewhere classified: Secondary | ICD-10-CM | POA: Diagnosis not present

## 2024-09-11 DIAGNOSIS — N200 Calculus of kidney: Secondary | ICD-10-CM | POA: Diagnosis not present

## 2024-09-11 DIAGNOSIS — K760 Fatty (change of) liver, not elsewhere classified: Secondary | ICD-10-CM | POA: Diagnosis not present

## 2024-09-11 DIAGNOSIS — R609 Edema, unspecified: Secondary | ICD-10-CM | POA: Diagnosis not present

## 2024-09-11 NOTE — Telephone Encounter (Signed)
 FYI Only or Action Required?: FYI only for provider.  Patient was last seen in primary care on 09/03/2024 by Kayla Jeoffrey RAMAN, FNP.  Called Nurse Triage reporting Edema and Leg Pain.  Symptoms began a while.  Interventions attempted: Other: has gone to cardiologist.  Symptoms are: unchanged.  Triage Disposition: See PCP When Office is Open (Within 3 Days)  Patient/caregiver understands and will follow disposition?: Unsure            Copied from CRM [id number]. Topic: Clinical - Red Word Triage >> Sep 11, 2024  8:33 AM Olam RAMAN wrote: Red Word that prompted transfer to Nurse Triage: Swelling all over body, ankles, stomach and right leg pain Reason for Disposition  MODERATE ankle swelling (e.g., interferes with normal activities, can't move joint normally) (Exceptions: Itchy, localized swelling; swelling is chronic.)    Pt  currently out of town-- triager advised going to local UC to be evaluated. Patient Wife verbalized understanding and to UC.  Answer Assessment - Initial Assessment Questions 1. LOCATION: Which ankle is swollen? Where is the swelling?     Bilateral feet swelling 2. ONSET: When did the swelling start?     A long time ago 3. SWELLING: How bad is the swelling? Or, How large is it? (e.g., mild, moderate, severe; size of localized swelling)      Swelling to knees 4. PAIN: Is there any pain? If Yes, ask: How bad is it? (Scale 0-10; or none, mild, moderate, severe)     Yes, was able to walk 5. CAUSE: What do you think caused the ankle swelling?     Unknown, pt reports has seen cardiologist 6. OTHER SYMPTOMS: Do you have any other symptoms? (e.g., fever, chest pain, difficulty breathing, calf pain)     denies 7. PREGNANCY: Is there any chance you are pregnant? When was your last menstrual period?     N/a  Protocols used: Ankle Swelling-A-AH

## 2024-09-16 ENCOUNTER — Emergency Department (HOSPITAL_COMMUNITY)
Admission: EM | Admit: 2024-09-16 | Discharge: 2024-09-16 | Disposition: A | Discharge status: Home / Self Care | Attending: Emergency Medicine | Admitting: Emergency Medicine

## 2024-09-16 ENCOUNTER — Emergency Department (HOSPITAL_COMMUNITY)

## 2024-09-16 ENCOUNTER — Encounter (HOSPITAL_COMMUNITY): Payer: Self-pay

## 2024-09-16 ENCOUNTER — Other Ambulatory Visit: Payer: Self-pay

## 2024-09-16 DIAGNOSIS — Z7901 Long term (current) use of anticoagulants: Secondary | ICD-10-CM | POA: Insufficient documentation

## 2024-09-16 DIAGNOSIS — R10A1 Flank pain, right side: Secondary | ICD-10-CM | POA: Diagnosis present

## 2024-09-16 DIAGNOSIS — M79651 Pain in right thigh: Secondary | ICD-10-CM | POA: Diagnosis not present

## 2024-09-16 DIAGNOSIS — I700 Atherosclerosis of aorta: Secondary | ICD-10-CM | POA: Diagnosis not present

## 2024-09-16 DIAGNOSIS — N200 Calculus of kidney: Secondary | ICD-10-CM | POA: Insufficient documentation

## 2024-09-16 DIAGNOSIS — M79604 Pain in right leg: Secondary | ICD-10-CM

## 2024-09-16 LAB — COMPREHENSIVE METABOLIC PANEL WITH GFR
ALT: 29 U/L (ref 0–44)
AST: 25 U/L (ref 15–41)
Albumin: 4 g/dL (ref 3.5–5.0)
Alkaline Phosphatase: 55 U/L (ref 38–126)
Anion gap: 14 (ref 5–15)
BUN: 23 mg/dL (ref 8–23)
CO2: 20 mmol/L — ABNORMAL LOW (ref 22–32)
Calcium: 9.3 mg/dL (ref 8.9–10.3)
Chloride: 106 mmol/L (ref 98–111)
Creatinine, Ser: 1.35 mg/dL — ABNORMAL HIGH (ref 0.61–1.24)
GFR, Estimated: 58 mL/min — ABNORMAL LOW (ref >60)
Glucose, Bld: 136 mg/dL — ABNORMAL HIGH (ref 70–99)
Potassium: 4 mmol/L (ref 3.5–5.1)
Sodium: 140 mmol/L (ref 135–145)
Total Bilirubin: 0.6 mg/dL (ref 0.0–1.2)
Total Protein: 7 g/dL (ref 6.5–8.1)

## 2024-09-16 LAB — CBC WITH DIFFERENTIAL/PLATELET
Abs Immature Granulocytes: 0.04 K/uL (ref 0.00–0.07)
Basophils Absolute: 0 K/uL (ref 0.0–0.1)
Basophils Relative: 0 %
Eosinophils Absolute: 0.3 K/uL (ref 0.0–0.5)
Eosinophils Relative: 5 %
HCT: 45.7 % (ref 39.0–52.0)
Hemoglobin: 14.8 g/dL (ref 13.0–17.0)
Immature Granulocytes: 1 %
Lymphocytes Relative: 20 %
Lymphs Abs: 1.4 K/uL (ref 0.7–4.0)
MCH: 29.4 pg (ref 26.0–34.0)
MCHC: 32.4 g/dL (ref 30.0–36.0)
MCV: 90.7 fL (ref 80.0–100.0)
Monocytes Absolute: 0.4 K/uL (ref 0.1–1.0)
Monocytes Relative: 6 %
Neutro Abs: 4.7 K/uL (ref 1.7–7.7)
Neutrophils Relative %: 68 %
Platelets: 163 K/uL (ref 150–400)
RBC: 5.04 MIL/uL (ref 4.22–5.81)
RDW: 14.3 % (ref 11.5–15.5)
WBC: 6.8 K/uL (ref 4.0–10.5)
nRBC: 0 % (ref 0.0–0.2)

## 2024-09-16 LAB — URINALYSIS, ROUTINE W REFLEX MICROSCOPIC
Bilirubin Urine: NEGATIVE
Glucose, UA: NEGATIVE mg/dL
Ketones, ur: NEGATIVE mg/dL
Leukocytes,Ua: NEGATIVE
Nitrite: NEGATIVE
Protein, ur: NEGATIVE mg/dL
Specific Gravity, Urine: 1.013 (ref 1.005–1.030)
pH: 5 (ref 5.0–8.0)

## 2024-09-16 LAB — LIPASE, BLOOD: Lipase: 26 U/L (ref 11–51)

## 2024-09-16 MED ORDER — HYDROCODONE-ACETAMINOPHEN 5-325 MG PO TABS: 2.0000 | ORAL_TABLET | Freq: Four times a day (QID) | ORAL | 0 refills | Status: DC | PRN

## 2024-09-16 MED ORDER — PREDNISONE 10 MG PO TABS: 40.0000 mg | ORAL_TABLET | Freq: Every day | ORAL | 0 refills | Status: AC

## 2024-09-16 MED ORDER — ONDANSETRON HCL 4 MG/2ML IJ SOLN
4.0000 mg | Freq: Once | INTRAMUSCULAR | Status: AC
  Administered 2024-09-16: 4 mg via INTRAVENOUS
  Filled 2024-09-16: qty 2

## 2024-09-16 MED ORDER — MORPHINE SULFATE (PF) 4 MG/ML IV SOLN
4.0000 mg | Freq: Once | INTRAVENOUS | Status: AC
  Administered 2024-09-16: 4 mg via INTRAVENOUS
  Filled 2024-09-16: qty 1

## 2024-09-16 NOTE — ED Notes (Signed)
 Pt is back from CT and ambulates to the bathroom to void.

## 2024-09-16 NOTE — ED Provider Notes (Signed)
 [place] EMERGENCY DEPARTMENT AT [facility] Provider Note   CSN: [id number] Arrival date & time: 09/16/24  1005     Patient presents with: Flank Pain   Aaron Clarke is a 68 y.o. male.   Patient is a 68 year old male who presents to the emergency department with a chief complaint of pain to the right flank and right leg.  Patient notes that symptoms have been ongoing for approximate the past 3 weeks.  He has been evaluated urgent care as well as another emergency department and was placed on muscle relaxers.  He notes that he continues to get ongoing symptoms.  He notes that the pain is worst along the lateral aspect of the right leg.  He notes that the pains do worsen with lying flat and improved with sitting or standing.  He denies any urinary bowel incontinence, saddle paresthesias, gait changes, fever, chills.  He notes that he has had some intermittent hematuria.  He denies any associated nausea, vomiting, diarrhea or constipation.   Flank Pain       Prior to Admission medications   Medication Sig Start Date End Date Taking? Authorizing Provider  HYDROcodone -acetaminophen  (NORCO/VICODIN) 5-325 MG tablet Take 2 tablets by mouth every 6 (six) hours as needed for severe pain (pain score 7-10). 09/16/24  Yes Daralene Bruckner D, PA-C  predniSONE  (DELTASONE ) 10 MG tablet Take 4 tablets (40 mg total) by mouth daily for 5 days. 09/16/24 09/21/24 Yes Daralene Bruckner D, PA-C  acetaminophen  (TYLENOL ) 500 MG tablet Take 500-1,000 mg by mouth every 6 (six) hours as needed (pain.).    [provider]  albuterol  (PROVENTIL ) (2.5 MG/3ML) 0.083% nebulizer solution Take 3 mLs (2.5 mg total) by nebulization every 6 (six) hours as needed for wheezing or shortness of breath. 12/27/23   Patsey Lot, MD  albuterol  (VENTOLIN  HFA) 108 (90 Base) MCG/ACT inhaler Inhale 1-2 puffs into the lungs every 6 (six) hours as needed for wheezing or shortness of breath. 08/20/22    Billy Asberry FALCON, PA-C  apixaban  (ELIQUIS ) 5 MG TABS tablet Take 2 tablets (10 mg total) by mouth 2 (two) times daily for 7 days, THEN 1 tablet (5 mg total) 2 (two) times daily. Patient taking differently: Take 1 tablet (5 mg) by mouth 2 (two) times daily. 03/14/24 09/17/24  Ricky Fines, MD  bisoprolol  (ZEBETA ) 5 MG tablet TAKE 1 TABLET BY MOUTH DAILY 09/09/24   Wert, Michael B, MD  budesonide -formoterol  (SYMBICORT ) 160-4.5 MCG/ACT inhaler USE 2 INHALATIONS BY MOUTH FIRST THING IN THE MORNING AND THEN  ANOTHER 2 INHALATIONS AFTER  ABOUT 12 HOURS LATER 09/09/24   Darlean Ozell NOVAK, MD  celecoxib  (CELEBREX ) 200 MG capsule TAKE 1 CAPSULE (200 MG TOTAL) BY MOUTH 2 (TWO) TIMES DAILY BETWEEN MEALS AS NEEDED. 07/31/24   Vernetta Bruckner GRADE, MD  methocarbamol  (ROBAXIN ) 500 MG tablet Take 1 tablet (500 mg total) by mouth every 6 (six) hours as needed for muscle spasms. 02/21/24   Vernetta Bruckner GRADE, MD  pantoprazole  (PROTONIX ) 40 MG tablet Take 40 mg by mouth daily before breakfast.    [provider]  rosuvastatin (CRESTOR) 10 MG tablet Take 1 tablet (10 mg total) by mouth daily. 08/22/24   Rana Lum CROME, NP  tirzepatide  (ZEPBOUND ) 2.5 MG/0.5ML injection vial Inject 2.5 mg into the skin once a week. Patient not taking: Reported on 09/03/2024 05/27/24   Kayla Jeoffrey RAMAN, FNP  valsartan -hydrochlorothiazide  (DIOVAN  HCT) 160-12.5 MG tablet Take 1 tablet by mouth daily. 06/03/24   Wert,  Ozell NOVAK, MD    Allergies: Patient has no known allergies.    Review of Systems  Genitourinary:  Positive for flank pain.  All other systems reviewed and are negative.   Updated Vital Signs BP 117/85 (BP Location: Right Arm)   Pulse 71   Temp 98.2 F (36.8 C) (Temporal)   Resp 16   Ht 6' (1.829 m)   Wt (!) 140.8 kg   SpO2 98%   BMI 42.10 kg/m   Physical Exam Vitals and nursing note reviewed.  Constitutional:      General: He is not in acute distress.    Appearance: Normal appearance. He is not  ill-appearing.  HENT:     Head: Normocephalic and atraumatic.     Nose: Nose normal.     Mouth/Throat:     Mouth: Mucous membranes are moist.  Eyes:     Extraocular Movements: Extraocular movements intact.     Conjunctiva/sclera: Conjunctivae normal.     Pupils: Pupils are equal, round, and reactive to light.  Cardiovascular:     Rate and Rhythm: Normal rate and regular rhythm.     Pulses: Normal pulses.     Heart sounds: Normal heart sounds. No murmur heard.    No gallop.  Pulmonary:     Effort: Pulmonary effort is normal. No respiratory distress.     Breath sounds: Normal breath sounds. No stridor. No wheezing, rhonchi or rales.  Abdominal:     General: Abdomen is flat. Bowel sounds are normal. There is no distension.     Palpations: Abdomen is soft.     Tenderness: There is no right CVA tenderness, left CVA tenderness or guarding.     Comments: Mild tenderness along the right flank and right side of abdomen  Musculoskeletal:        General: No swelling, deformity or signs of injury. Normal range of motion.     Cervical back: Normal range of motion and neck supple.     Comments: Mild tenderness along the lateral aspect of the right thigh, no midline lumbar or thoracic tenderness, no step-off or deformity, DP and PT pulses are 2+ distally, sensation intact distally, full range of motion noted throughout bilateral lower extremities, pelvis stable to AP lateral compression  Skin:    General: Skin is warm and dry.  Neurological:     General: No focal deficit present.     Mental Status: He is alert and oriented to person, place, and time. Mental status is at baseline.     Cranial Nerves: No cranial nerve deficit.     Sensory: No sensory deficit.     Motor: No weakness.     Coordination: Coordination normal.     Gait: Gait normal.     Comments: Extension of bilateral great toes intact, extension and flexion of hips intact  Psychiatric:        Mood and Affect: Mood normal.         Behavior: Behavior normal.        Thought Content: Thought content normal.        Judgment: Judgment normal.     (all labs ordered are listed, but only abnormal results are displayed) Labs Reviewed  URINALYSIS, ROUTINE W REFLEX MICROSCOPIC - Abnormal; Notable for the following components:      Result Value   Hgb urine dipstick MODERATE (*)    Bacteria, UA RARE (*)    All other components within normal limits  COMPREHENSIVE METABOLIC PANEL WITH GFR -  Abnormal; Notable for the following components:   CO2 20 (*)    Glucose, Bld 136 (*)    Creatinine, Ser 1.35 (*)    GFR, Estimated 58 (*)    All other components within normal limits  CBC WITH DIFFERENTIAL/PLATELET  LIPASE, BLOOD    EKG: None  Radiology: CT Renal Stone Study Result Date: 09/16/2024 CLINICAL DATA:  Three-week history of right flank pain and right upper leg pain associated with hematuria EXAM: CT ABDOMEN AND PELVIS WITHOUT CONTRAST TECHNIQUE: Multidetector CT imaging of the abdomen and pelvis was performed following the standard protocol without IV contrast. RADIATION DOSE REDUCTION: This exam was performed according to the departmental dose-optimization program which includes automated exposure control, adjustment of the mA and/or kV according to patient size and/or use of iterative reconstruction technique. COMPARISON:  CTA chest dated 05/25/2024 and multiple priors dating back to CT abdomen and pelvis dated 05/23/2011 FINDINGS: Lower chest: Unchanged calcified left lower lobe granuloma. Partially imaged lingular subsegmental atelectasis/scarring. No pleural effusion or pneumothorax demonstrated. Partially imaged heart size is normal. Hepatobiliary: No focal hepatic lesions. No intra or extrahepatic biliary ductal dilation. Cholecystectomy. Pancreas: No main pancreatic ductal dilation. Hyperattenuating rounded lesion in the anterior pancreatic neck measuring 1.4 x 1.4 cm (2:30), not substantially changed in size from recent  studies but slowly increasing in size from 1.1 cm on 05/23/2011. Spleen: Normal splenic size. Scattered calcified splenic granulomata. Adrenals/Urinary Tract: No adrenal nodules. Atrophic right kidney. Bilateral hypodensities, likely cysts. Bilateral nonobstructing stones measuring up to 4 mm in the upper pole right kidney. No hydronephrosis. No focal bladder wall thickening. Stomach/Bowel: Normal appearance of the stomach. No evidence of bowel wall thickening, distention, or inflammatory changes. Colonic diverticulosis without acute diverticulitis. Normal appendix. Vascular/Lymphatic: Aortic atherosclerosis. No enlarged abdominal or pelvic lymph nodes. Reproductive: Prostate is unremarkable. Other: No free fluid, fluid collection, or free air. Musculoskeletal: No acute or abnormal lytic or blastic osseous lesions. Partially imaged right hip arthroplasty. Multilevel degenerative changes of the partially imaged thoracic and lumbar spine. IMPRESSION: 1. Bilateral nonobstructing renal stones measuring up to 4 mm in the upper pole right kidney. No hydronephrosis. 2. Hyperattenuating rounded lesion in the anterior pancreatic neck measuring 1.4 x 1.4 cm, not substantially changed in size from recent studies but slowly increasing in size since 05/23/2011, suspicious for a pancreatic neuroendocrine tumor. Recommend further evaluation with nonemergent contrast-enhanced MRI/MRCP or pancreas protocol CT abdomen if not previously performed. 3.  Aortic Atherosclerosis (ICD10-I70.0). Electronically Signed   By: Limin  Xu M.D.   On: 09/16/2024 12:38     Procedures   Medications Ordered in the ED  morphine  (PF) 4 MG/ML injection 4 mg (4 mg Intravenous Given 09/16/24 1235)  ondansetron  (ZOFRAN ) injection 4 mg (4 mg Intravenous Given 09/16/24 1235)                                    Medical Decision Making Patient is doing well at this time and is stable for discharge home.  Discussed with patient I am concerned that  symptoms may be consistent with meralgia paresthesia.  Patient has no concerning neurological deficits and is otherwise TUNAFISH negative.  Patient has no indication for acute surgical process on CT scan of abdomen and pelvis.  Do not suspect cauda equina syndrome, vertebral osteomyelitis, epidural abscess.  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  He was provided with a copy of his CT scan report.  Will continue symptomatic treatment outpatient basis and treat with a short course of steroids.  Do not suspect that MRI is warranted on emergent basis and do not suspect that admission is warranted at this time.  Close follow-up with PCP was discussed as well as strict turn precautions for any new or worsening symptoms.  The importance of continued follow-up on the pancreatic lesion was discussed as well.  Patient voiced understanding to the plan and had no additional questions.  Amount and/or Complexity of Data Reviewed Labs: ordered. Radiology: ordered.  Risk Prescription drug management.        Final diagnoses:  Right flank pain  Right leg pain    ED Discharge Orders          Ordered    HYDROcodone -acetaminophen  (NORCO/VICODIN) 5-325 MG tablet  Every 6 hours PRN        09/16/24 1420    predniSONE  (DELTASONE ) 10 MG tablet  Daily        09/16/24 1420               Daralene Lonni BIRCH, PA-C 09/16/24 1552    Melvenia Motto, MD 09/16/24 [phone]

## 2024-09-16 NOTE — Discharge Instructions (Signed)
 Please follow-up closely with your primary care doctor on an outpatient basis for reevaluation.  Please continue to ensure that your primary care doctor monitors the lesion to your pancreas.  You were provided with a copy of your CT scan report today and will need follow-up MRI.  Return to the emergency department immediately for any new or worsening symptoms.

## 2024-09-16 NOTE — ED Triage Notes (Signed)
 Pt arrived via POV c/o right flank pain and right upper leg pain X 3 weeks. Pt also reports hematuria and reports pain starts in his back and radiates to his groin.

## 2024-09-17 ENCOUNTER — Other Ambulatory Visit: Payer: Self-pay | Admitting: Internal Medicine

## 2024-09-17 ENCOUNTER — Other Ambulatory Visit: Payer: Self-pay | Admitting: Family Medicine

## 2024-09-17 DIAGNOSIS — I10 Essential (primary) hypertension: Secondary | ICD-10-CM

## 2024-09-17 NOTE — Telephone Encounter (Signed)
 Copied from CRM [id number]. Topic: Clinical - Medication Refill >> Sep 17, 2024 11:51 AM Pinkey ORN wrote: Medication: valsartan -hydrochlorothiazide  (DIOVAN  HCT) 160-12.5 MG tablet  Has the patient contacted their pharmacy? Yes (Agent: If no, request that the patient contact the pharmacy for the refill. If patient does not wish to contact the pharmacy document the reason why and proceed with request.) (Agent: If yes, when and what did the pharmacy advise?)  This is the patient's preferred pharmacy:  [id number] - [place],  - [address] [facility] [place] [address] Phone: [phone] Fax: [phone]  Is this the correct pharmacy for this prescription? Yes If no, delete pharmacy and type the correct one.   Has the prescription been filled recently? No  Is the patient out of the medication? Yes  Has the patient been seen for an appointment in the last year OR does the patient have an upcoming appointment? Yes  Can we respond through MyChart? Yes  Agent: Please be advised that Rx refills may take up to 3 business days. We ask that you follow-up with your pharmacy.

## 2024-09-19 NOTE — Telephone Encounter (Signed)
 Requested medication (s) are due for refill today: yes  Requested medication (s) are on the active medication list: yes  Last refill:  06/03/24  Future visit scheduled: yes  Notes to clinic:  Unable to refill per protocol, last refill by another provider.      Requested Prescriptions  Pending Prescriptions Disp Refills   valsartan -hydrochlorothiazide  (DIOVAN  HCT) 160-12.5 MG tablet 90 tablet 0    Sig: Take 1 tablet by mouth daily.     Cardiovascular: ARB + Diuretic Combos Failed - 09/19/2024  8:28 AM      Failed - Cr in normal range and within 180 days    Creat  Date Value Ref Range Status  08/28/2024 1.68 (H) 0.70 - 1.35 mg/dL Final   Creatinine, Ser  Date Value Ref Range Status  09/16/2024 1.35 (H) 0.61 - 1.24 mg/dL Final         Passed - K in normal range and within 180 days    Potassium  Date Value Ref Range Status  09/16/2024 4.0 3.5 - 5.1 mmol/L Final         Passed - Na in normal range and within 180 days    Sodium  Date Value Ref Range Status  09/16/2024 140 135 - 145 mmol/L Final  07/30/2024 136 134 - 144 mmol/L Final         Passed - eGFR is 10 or above and within 180 days    GFR calc Af Amer  Date Value Ref Range Status  07/06/2017 >60 >60 mL/min Final    Comment:    (NOTE) The eGFR has been calculated using the CKD EPI equation. This calculation has not been validated in all clinical situations. eGFR's persistently <60 mL/min signify possible Chronic Kidney Disease.    GFR, Estimated  Date Value Ref Range Status  09/16/2024 58 (L) >60 mL/min Final    Comment:    (NOTE) Calculated using the CKD-EPI Creatinine Equation (2021)    eGFR  Date Value Ref Range Status  07/30/2024 45 (L) >59 mL/min/1.73 Final         Passed - Patient is not pregnant      Passed - Last BP in normal range    BP Readings from Last 1 Encounters:  09/16/24 125/76         Passed - Valid encounter within last 6 months    Recent Outpatient Visits           2  weeks ago Need for vaccination   [place] [facility] Kayla Jeoffrey RAMAN, FNP   4 months ago Primary hypertension   [place] [facility] Kayla Jeoffrey RAMAN, FNP   5 months ago Primary hypertension   [place] [facility] Kayla Jeoffrey RAMAN, FNP   6 months ago Status post total replacement of right hip   [place] [facility] Kayla Jeoffrey RAMAN, FNP   7 months ago Encounter for [employer] annual wellness exam   [place] [facility] Kayla Jeoffrey RAMAN, FNP       Future Appointments             In 3 months Vernetta, Lonni GRADE, MD [facility]

## 2024-09-21 ENCOUNTER — Other Ambulatory Visit: Payer: Self-pay | Admitting: Internal Medicine

## 2024-09-21 DIAGNOSIS — I10 Essential (primary) hypertension: Secondary | ICD-10-CM

## 2024-09-22 ENCOUNTER — Encounter: Payer: Self-pay | Admitting: Radiology

## 2024-09-22 DIAGNOSIS — R069 Unspecified abnormalities of breathing: Secondary | ICD-10-CM | POA: Diagnosis not present

## 2024-09-24 ENCOUNTER — Encounter: Payer: Self-pay | Admitting: Primary Care

## 2024-09-24 ENCOUNTER — Ambulatory Visit: Admitting: Primary Care

## 2024-09-24 VITALS — BP 139/84 | HR 68 | Temp 97.9°F | Ht 72.0 in | Wt 318.8 lb

## 2024-09-24 DIAGNOSIS — I10 Essential (primary) hypertension: Secondary | ICD-10-CM | POA: Diagnosis not present

## 2024-09-24 DIAGNOSIS — R0683 Snoring: Secondary | ICD-10-CM | POA: Diagnosis not present

## 2024-09-24 DIAGNOSIS — J449 Chronic obstructive pulmonary disease, unspecified: Secondary | ICD-10-CM

## 2024-09-24 MED ORDER — VALSARTAN-HYDROCHLOROTHIAZIDE 160-12.5 MG PO TABS: 1.0000 | ORAL_TABLET | Freq: Every day | ORAL | 0 refills | Status: DC

## 2024-09-24 NOTE — Progress Notes (Signed)
 @Patient  ID: Aaron Clarke, male    DOB: [birth year], 68 y.o.   MRN: [id number]  Chief Complaint  Patient presents with   Medical Management of Chronic Issues    HST results     Referring provider: Kayla Jeoffrey RAMAN, FNP  HPI: 68 year old male. PMH HTN, COPD/asthma, GERD, stage 3 CKD, obesity. Patient of Dr. Darlean, last seen on 06/03/24.   09/24/2024 Discussed the use of AI scribe software for clinical note transcription with the patient, who gave verbal consent to proceed. History of Present Illness  Aaron Clarke is a 68 year old male who presents for review of his sleep study results. He was referred by Dr. Darlean for evaluation of his sleep study results.  He underwent a sleep study due to concerns about snoring and potential sleep apnea. HST showed occasional apneas but not frequent enough for diagnosis of OSA. Average AHI 3.7/hour. Mild snoring with oxygen desaturation to 82%. Patient spent 6 minutes with O2 <88%. Mild snoring. His snoring sometimes wakes him up, and his wife has observed episodes where he appears to struggle for breath during sleep. He typically sleeps on his left side and denies frequent daytime sleepiness, although he sometimes feels tired.  He has a history of COPD and asthma, managed with Symbicort , which he uses as prescribed, two puffs in the morning and evening. He experiences occasional shortness of breath with activity, particularly when he is less active than usual. He has not experienced any recent asthma flare-ups.  He underwent a hip replacement approximately seven to eight months ago. Initially, his recovery was good, but in the past month, he has experienced significant pain in his right hip and buttock, especially when lying on his right side, which disrupts his sleep.  He has a history of obesity with a BMI of 43. He previously tried Zepbound  for weight loss but discontinued it due to side effects and concerns about kidney health. He has a  history of kidney problems and is advised to drink plenty of water .  He is on valsartan  for hypertension but has been without it for a couple of weeks due to running out of refills. His blood pressure today is elevated at 139/84, whereas his baseline is typically around 110/70. He monitors his blood pressure regularly at home.  He also takes Celebrex  for pain and Robaxin  for muscle relaxation. He previously used hydrocodone  post-surgery but is no longer on it.   No Known Allergies  Immunization History  Administered Date(s) Administered   INFLUENZA, HIGH DOSE SEASONAL PF 09/03/2024   PNEUMOCOCCAL CONJUGATE-20 12/17/2023   Tdap 12/17/2023   Zoster Recombinant(Shingrix) 09/03/2024    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   Family history of breast cancer    mother   Hypertension    Osteoarthritis of right hip    PE (pulmonary thromboembolism) (HCC) 10/23/2022    Tobacco History: Social History   Tobacco Use  Smoking Status Former   Current packs/day: 0.00   Types: Cigarettes   Start date: 06/19/1974   Quit date: 06/19/1988   Years since quitting: 36.2  Smokeless Tobacco Never   Counseling given: Not Answered   Outpatient Medications Prior to Visit  Medication Sig Dispense Refill   acetaminophen  (TYLENOL ) 500 MG tablet Take 500-1,000 mg by mouth every 6 (six) hours as  needed (pain.).     albuterol  (PROVENTIL ) (2.5 MG/3ML) 0.083% nebulizer solution Take 3 mLs (2.5 mg total) by nebulization every 6 (six) hours as needed for wheezing or shortness of breath. 75 mL 0   albuterol  (VENTOLIN  HFA) 108 (90 Base) MCG/ACT inhaler Inhale 1-2 puffs into the lungs every 6 (six) hours as needed for wheezing or shortness of breath. 8 g 0   apixaban  (ELIQUIS ) 5 MG TABS tablet Take 2 tablets (10 mg total) by mouth 2 (two) times daily for 7 days, THEN 1  tablet (5 mg total) 2 (two) times daily. (Patient taking differently: Take 1 tablet (5 mg) by mouth 2 (two) times daily.) 60 tablet 6   bisoprolol  (ZEBETA ) 5 MG tablet TAKE 1 TABLET BY MOUTH DAILY 100 tablet 2   budesonide -formoterol  (SYMBICORT ) 160-4.5 MCG/ACT inhaler USE 2 INHALATIONS BY MOUTH FIRST THING IN THE MORNING AND THEN  ANOTHER 2 INHALATIONS AFTER  ABOUT 12 HOURS LATER 30.6 g 3   celecoxib  (CELEBREX ) 200 MG capsule TAKE 1 CAPSULE (200 MG TOTAL) BY MOUTH 2 (TWO) TIMES DAILY BETWEEN MEALS AS NEEDED. 60 capsule 1   HYDROcodone -acetaminophen  (NORCO/VICODIN) 5-325 MG tablet Take 2 tablets by mouth every 6 (six) hours as needed for severe pain (pain score 7-10). 10 tablet 0   methocarbamol  (ROBAXIN ) 500 MG tablet Take 1 tablet (500 mg total) by mouth every 6 (six) hours as needed for muscle spasms. 30 tablet 1   pantoprazole  (PROTONIX ) 40 MG tablet Take 40 mg by mouth daily before breakfast.     rosuvastatin (CRESTOR) 10 MG tablet Take 1 tablet (10 mg total) by mouth daily. 90 tablet 2   valsartan -hydrochlorothiazide  (DIOVAN  HCT) 160-12.5 MG tablet Take 1 tablet by mouth daily. 90 tablet 0   tirzepatide  (ZEPBOUND ) 2.5 MG/0.5ML injection vial Inject 2.5 mg into the skin once a week. (Patient not taking: Reported on 09/24/2024) 2 mL 0   No facility-administered medications prior to visit.    Review of Systems  Review of Systems  Constitutional:  Positive for fatigue.  Respiratory: Negative.    Musculoskeletal:  Positive for arthralgias.  Psychiatric/Behavioral:  Positive for sleep disturbance.    Physical Exam  BP 139/84   Pulse 68   Temp 97.9 F (36.6 C)   Ht 6' (1.829 m)   Wt (!) 318 lb 12.8 oz (144.6 kg)   SpO2 94% Comment: ra  BMI 43.24 kg/m  Physical Exam Constitutional:      Appearance: Normal appearance. He is well-developed. He is obese.  HENT:     Head: Normocephalic and atraumatic.     Mouth/Throat:     Mouth: Mucous membranes are moist.     Pharynx: Oropharynx is  clear.  Cardiovascular:     Rate and Rhythm: Normal rate and regular rhythm.     Heart sounds: Normal heart sounds.  Pulmonary:     Effort: Pulmonary effort is normal. No respiratory distress.     Breath sounds: Normal breath sounds. No wheezing or rhonchi.  Musculoskeletal:        General: Normal range of motion.     Cervical back: Normal range of motion and neck supple.  Skin:    General: Skin is warm and dry.     Findings: No erythema or rash.  Neurological:     General: No focal deficit present.     Mental Status: He is alert and oriented to person, place, and time. Mental status is at baseline.  Psychiatric:  Mood and Affect: Mood normal.        Behavior: Behavior normal.        Thought Content: Thought content normal.        Judgment: Judgment normal.       Lab Results:  CBC    Component Value Date/Time   WBC 6.8 09/16/2024 1108   RBC 5.04 09/16/2024 1108   HGB 14.8 09/16/2024 1108   HGB 15.8 07/24/2024 1536   HCT 45.7 09/16/2024 1108   HCT 48.1 07/24/2024 1536   PLT 163 09/16/2024 1108   PLT 197 07/24/2024 1536   MCV 90.7 09/16/2024 1108   MCV 89 07/24/2024 1536   MCH 29.4 09/16/2024 1108   MCHC 32.4 09/16/2024 1108   RDW 14.3 09/16/2024 1108   RDW 14.1 07/24/2024 1536   LYMPHSABS 1.4 09/16/2024 1108   LYMPHSABS 1.6 01/02/2024 1542   MONOABS 0.4 09/16/2024 1108   EOSABS 0.3 09/16/2024 1108   EOSABS 0.3 01/02/2024 1542   BASOSABS 0.0 09/16/2024 1108   BASOSABS 0.1 01/02/2024 1542    BMET    Component Value Date/Time   NA 140 09/16/2024 1108   NA 136 07/30/2024 1226   K 4.0 09/16/2024 1108   CL 106 09/16/2024 1108   CO2 20 (L) 09/16/2024 1108   GLUCOSE 136 (H) 09/16/2024 1108   BUN 23 09/16/2024 1108   BUN 29 (H) 07/30/2024 1226   CREATININE 1.35 (H) 09/16/2024 1108   CREATININE 1.68 (H) 08/28/2024 0919   CALCIUM 9.3 09/16/2024 1108   GFRNONAA 58 (L) 09/16/2024 1108   GFRAA >60 07/06/2017 0035    BNP    Component Value Date/Time    BNP 29.0 12/27/2023 2027    ProBNP No results found for: PROBNP  Imaging: CT Renal Stone Study Result Date: 09/16/2024 CLINICAL DATA:  Three-week history of right flank pain and right upper leg pain associated with hematuria EXAM: CT ABDOMEN AND PELVIS WITHOUT CONTRAST TECHNIQUE: Multidetector CT imaging of the abdomen and pelvis was performed following the standard protocol without IV contrast. RADIATION DOSE REDUCTION: This exam was performed according to the departmental dose-optimization program which includes automated exposure control, adjustment of the mA and/or kV according to patient size and/or use of iterative reconstruction technique. COMPARISON:  CTA chest dated 05/25/2024 and multiple priors dating back to CT abdomen and pelvis dated 05/23/2011 FINDINGS: Lower chest: Unchanged calcified left lower lobe granuloma. Partially imaged lingular subsegmental atelectasis/scarring. No pleural effusion or pneumothorax demonstrated. Partially imaged heart size is normal. Hepatobiliary: No focal hepatic lesions. No intra or extrahepatic biliary ductal dilation. Cholecystectomy. Pancreas: No main pancreatic ductal dilation. Hyperattenuating rounded lesion in the anterior pancreatic neck measuring 1.4 x 1.4 cm (2:30), not substantially changed in size from recent studies but slowly increasing in size from 1.1 cm on 05/23/2011. Spleen: Normal splenic size. Scattered calcified splenic granulomata. Adrenals/Urinary Tract: No adrenal nodules. Atrophic right kidney. Bilateral hypodensities, likely cysts. Bilateral nonobstructing stones measuring up to 4 mm in the upper pole right kidney. No hydronephrosis. No focal bladder wall thickening. Stomach/Bowel: Normal appearance of the stomach. No evidence of bowel wall thickening, distention, or inflammatory changes. Colonic diverticulosis without acute diverticulitis. Normal appendix. Vascular/Lymphatic: Aortic atherosclerosis. No enlarged abdominal or pelvic lymph  nodes. Reproductive: Prostate is unremarkable. Other: No free fluid, fluid collection, or free air. Musculoskeletal: No acute or abnormal lytic or blastic osseous lesions. Partially imaged right hip arthroplasty. Multilevel degenerative changes of the partially imaged thoracic and lumbar spine. IMPRESSION: 1. Bilateral nonobstructing renal stones measuring  up to 4 mm in the upper pole right kidney. No hydronephrosis. 2. Hyperattenuating rounded lesion in the anterior pancreatic neck measuring 1.4 x 1.4 cm, not substantially changed in size from recent studies but slowly increasing in size since 05/23/2011, suspicious for a pancreatic neuroendocrine tumor. Recommend further evaluation with nonemergent contrast-enhanced MRI/MRCP or pancreas protocol CT abdomen if not previously performed. 3.  Aortic Atherosclerosis (ICD10-I70.0). Electronically Signed   By: Limin  Xu M.D.   On: 09/16/2024 12:38     Assessment & Plan:  1. Loud snoring (Primary) - PSG SLEEP STUDY; Future  2. Essential hypertension - valsartan -hydrochlorothiazide  (DIOVAN  HCT) 160-12.5 MG tablet; Take 1 tablet by mouth daily.  Dispense: 90 tablet; Refill: 0  3. Chronic obstructive pulmonary disease, unspecified COPD type (HCC)  Assessment and Plan Assessment & Plan Suspected obstructive sleep apnea with snoring and obesity Patient underwent a home sleep study that showed occasional apnea with an average of 3.5 apneic events per hour and mild snoring. Symptoms include waking up gasping and choking, and daytime fatigue. BMI is 43, indicating obesity, a risk factor for sleep apnea. Home sleep study may not be as sensitive as an in-lab study. Symptoms suggest possible sleep apnea, warranting further investigation. - Ordered in-lab sleep study to confirm diagnosis of sleep apnea. - Advised weight loss and positional therapy (side sleeping) to reduce apneic events.  Essential hypertension Blood pressure elevated at 139/84, higher than  baseline of 110/70. Recent lapse in valsartan  due to running out of medication. Blood pressure management is crucial to prevent complications. - Refilled valsartan  prescription. - Monitor blood pressure at home.  Chronic obstructive pulmonary disease and asthma Managed with Symbicort  . Occasional shortness of breath, possibly due to decreased physical activity. No recent flare-ups reported. - Continue Symbicort  160mcg as prescribed (two puffs in the morning and two puffs in the evening). - Encouraged resumption of physical activity once pain is managed.  Recording duration: 11 minutes   Almarie LELON Ferrari, NP 09/24/2024

## 2024-09-24 NOTE — Patient Instructions (Addendum)
  VISIT SUMMARY: Today, you came in to review the results of your sleep study. We discussed your snoring and potential sleep apnea, as well as your history of COPD, asthma, recent hip pain, obesity, and elevated blood pressure.  YOUR PLAN: -SUSPECTED OBSTRUCTIVE SLEEP APNEA WITH SNORING AND OBESITY: Obstructive sleep apnea is a condition where your breathing stops and starts during sleep due to blocked airways. Your sleep study showed occasional apneic events and mild snoring. We have ordered an in-lab sleep study to confirm the diagnosis. In the meantime, we recommend weight loss and sleeping on your side to help reduce these events.  -ESSENTIAL HYPERTENSION: Hypertension, or high blood pressure, is when the force of your blood against your artery walls is too high. Your blood pressure was elevated today, likely due to running out of your valsartan  medication. We have refilled your prescription and ask that you continue to monitor your blood pressure at home.  -CHRONIC OBSTRUCTIVE PULMONARY DISEASE AND ASTHMA: COPD and asthma are lung conditions that make it hard to breathe. You are managing these with Symbicort , and we encourage you to continue using it as prescribed. We also recommend resuming physical activity once your hip pain is under control.  INSTRUCTIONS: Please schedule an in-lab sleep study to further investigate your sleep apnea. Continue taking your valsartan  for blood pressure and monitor it at home. Keep using Symbicort  as prescribed and try to increase your physical activity when your hip pain allows. Follow up with us  if you have any concerns or if your symptoms change.  Orders: [facility]G in lab re: loud snoring  Rx: Divoan sent to pharmacy   Follow-up: 3-4 months with Dr. Darlean

## 2024-09-25 ENCOUNTER — Encounter: Payer: Self-pay | Admitting: Family Medicine

## 2024-09-25 ENCOUNTER — Ambulatory Visit (INDEPENDENT_AMBULATORY_CARE_PROVIDER_SITE_OTHER): Admitting: Family Medicine

## 2024-09-25 VITALS — BP 122/72 | HR 73 | Temp 97.1°F | Ht 73.0 in | Wt 320.0 lb

## 2024-09-25 DIAGNOSIS — M5441 Lumbago with sciatica, right side: Secondary | ICD-10-CM

## 2024-09-25 DIAGNOSIS — N200 Calculus of kidney: Secondary | ICD-10-CM

## 2024-09-25 DIAGNOSIS — M5430 Sciatica, unspecified side: Secondary | ICD-10-CM | POA: Insufficient documentation

## 2024-09-25 MED ORDER — HYDROCODONE-ACETAMINOPHEN 5-325 MG PO TABS: 2.0000 | ORAL_TABLET | Freq: Four times a day (QID) | ORAL | 0 refills | Status: DC | PRN

## 2024-09-25 NOTE — Progress Notes (Signed)
 Acute Office Visit  Patient ID: Aaron Clarke, male    DOB: July 17, 1956, 68 y.o.   MRN: [id number]  PCP: Kayla Jeoffrey RAMAN, FNP  Chief Complaint  Patient presents with   Hospitalization Follow-up    10/28 rt flank pain      Subjective:     HPI  Discussed the use of AI scribe software for clinical note transcription with the patient, who gave verbal consent to proceed.  History of Present Illness Aaron Clarke is a 68 year old male who presents with right-sided flank pain radiating to the groin and leg.  He experiences persistent right-sided flank pain radiating into the groin and down the leg, which worsens at night and disrupts sleep. The pain is severe, often waking him up and preventing restful sleep. He describes it as 'it hurts like it wakes me up all night long.'  A recent visit to the emergency room included a CT scan that revealed two kidney stones, each up to four millimeters in size. He was prescribed a short course of prednisone  and hydrocodone , which provided temporary relief for about two days, but the pain returned once the medication was completed.  The pain is primarily located in the lower back on the right side, extending to the hip and buttock, and shooting down the leg. He has difficulty turning over in bed due to the pain, which is significant enough to cause discomfort. No history of back problems or injuries and no prior back x-ray. He also denies passing a kidney stone.  He reports that his leg feels a little numb and notes he had previous hip surgery. Increased pain occurs when lying down, particularly on the right side, and he feels tired during the day due to lack of sleep. His leg has given out on him, causing him to nearly fall twice.  He is currently not taking any medication for the pain as the prescribed prednisone  and hydrocodone  have been completed. He is unsure about taking ibuprofen due to his kidney condition. No numbness around the waist,  incontinence of urine or stool, and any recent falls or injuries.  HPI ED 09/16/2024 for reference only: Patient is a 68 year old male who presents to the emergency department with a chief complaint of pain to the right flank and right leg. Patient notes that symptoms have been ongoing for approximate the past 3 weeks. He has been evaluated urgent care as well as another emergency department and was placed on muscle relaxers. He notes that he continues to get ongoing symptoms. He notes that the pain is worst along the lateral aspect of the right leg. He notes that the pains do worsen with lying flat and improved with sitting or standing. He denies any urinary bowel incontinence, saddle paresthesias, gait changes, fever, chills. He notes that he has had some intermittent hematuria. He denies any associated nausea, vomiting, diarrhea or constipation.   Medical Decision Making Patient is doing well at this time and is stable for discharge home.  Discussed with patient I am concerned that symptoms may be consistent with meralgia paresthesia.  Patient has no concerning neurological deficits and is otherwise TUNAFISH negative.  Patient has no indication for acute surgical process on CT scan of abdomen and pelvis.  Do not suspect cauda equina syndrome, vertebral osteomyelitis, epidural abscess.  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  He was provided with a copy of his CT scan report.  Will continue symptomatic treatment outpatient basis and treat with a short course of steroids.  Do not suspect that MRI is warranted on emergent basis and do not suspect that admission is warranted at this time.  Close follow-up with PCP was discussed as well as strict turn precautions for any new or worsening symptoms.  The importance of continued follow-up on the pancreatic lesion was discussed as well.  Patient voiced understanding to the plan and had no additional  questions.   Review of Systems  All other systems reviewed and are negative.   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   Family history of breast cancer    mother   Hypertension    Osteoarthritis of right hip    PE (pulmonary thromboembolism) (HCC) 10/23/2022    Past Surgical History:  Procedure Laterality Date   CHOLECYSTECTOMY  12/19/2011   Procedure: LAPAROSCOPIC CHOLECYSTECTOMY WITH INTRAOPERATIVE CHOLANGIOGRAM;  Surgeon: Elon CHRISTELLA Pacini, MD;  Location: [facility] OR;  Service: General;  Laterality: N/A;   COLONOSCOPY WITH PROPOFOL  N/A 09/13/2022   Procedure: COLONOSCOPY WITH PROPOFOL ;  Surgeon: Unk Corinn Skiff, MD;  Location: [facility] ENDOSCOPY;  Service: Gastroenterology;  Laterality: N/A;   Colonscopy     Fatty tumor  2003   L chest   LEFT HEART CATH AND CORONARY ANGIOGRAPHY N/A 08/01/2024   Procedure: LEFT HEART CATH AND CORONARY ANGIOGRAPHY;  Surgeon: Anner Alm ORN, MD;  Location: [facility] INVASIVE CV LAB;  Service: Cardiovascular;  Laterality: N/A;   TOTAL HIP ARTHROPLASTY Right 02/19/2024   Procedure: ARTHROPLASTY, HIP, TOTAL, ANTERIOR APPROACH;  Surgeon: Vernetta Lonni GRADE, MD;  Location: [facility] OR;  Service: Orthopedics;  Laterality: Right;    Current Outpatient Medications on File Prior to Visit  Medication Sig Dispense Refill   albuterol  (PROVENTIL ) (2.5 MG/3ML) 0.083% nebulizer solution Take 3 mLs (2.5 mg total) by nebulization every 6 (six) hours as needed for wheezing or shortness of breath. 75 mL 0   albuterol  (VENTOLIN  HFA) 108 (90 Base) MCG/ACT inhaler Inhale 1-2 puffs into the lungs every 6 (six) hours as needed for wheezing or shortness of breath. 8 g 0   bisoprolol  (ZEBETA ) 5 MG tablet TAKE 1 TABLET BY MOUTH DAILY 100 tablet 2   budesonide -formoterol  (SYMBICORT ) 160-4.5 MCG/ACT inhaler USE 2 INHALATIONS BY MOUTH FIRST THING IN THE  MORNING AND THEN  ANOTHER 2 INHALATIONS AFTER  ABOUT 12 HOURS LATER 30.6 g 3   lidocaine  (LIDODERM ) 5 % Place 1 patch onto the skin daily.     methocarbamol  (ROBAXIN ) 500 MG tablet Take 1 tablet (500 mg total) by mouth every 6 (six) hours as needed for muscle spasms. 30 tablet 1   pantoprazole  (PROTONIX ) 40 MG tablet Take 40 mg by mouth daily before breakfast.     pravastatin (PRAVACHOL) 10 MG tablet Take 10 mg by mouth daily.     predniSONE  (DELTASONE ) 20 MG tablet Take 20 mg by mouth daily with breakfast.     rosuvastatin (CRESTOR) 10 MG tablet Take 1 tablet (10 mg total) by mouth daily. 90 tablet 2   valsartan -hydrochlorothiazide  (DIOVAN  HCT) 160-12.5 MG tablet Take 1 tablet by mouth daily. 90 tablet 0   No current facility-administered medications on file prior to visit.    No Known Allergies     Objective:    BP 122/72   Pulse 73  Temp (!) 97.1 F (36.2 C)   Ht 6' 1 (1.854 m)   Wt (!) 320 lb (145.2 kg)   SpO2 96%   BMI 42.22 kg/m    Physical Exam Vitals and nursing note reviewed.  Constitutional:      Appearance: Normal appearance. He is normal weight.  HENT:     Head: Normocephalic and atraumatic.  Cardiovascular:     Rate and Rhythm: Normal rate and regular rhythm.     Pulses: Normal pulses.     Heart sounds: Normal heart sounds.  Pulmonary:     Effort: Pulmonary effort is normal.     Breath sounds: Normal breath sounds.  Musculoskeletal:     Thoracic back: Normal.     Lumbar back: Normal. No spasms, tenderness or bony tenderness. Negative right straight leg raise test and negative left straight leg raise test.  Skin:    General: Skin is warm and dry.     Capillary Refill: Capillary refill takes less than 2 seconds.  Neurological:     General: No focal deficit present.     Mental Status: He is alert and oriented to person, place, and time. Mental status is at baseline.  Psychiatric:        Mood and Affect: Mood normal.        Behavior: Behavior normal.         Thought Content: Thought content normal.        Judgment: Judgment normal.       No results found for any visits on 09/25/24.     Assessment & Plan:   Problem List Items Addressed This Visit     Nephrolithiasis - Primary   Relevant Medications   HYDROcodone -acetaminophen  (NORCO/VICODIN) 5-325 MG tablet   Other Relevant Orders   Ambulatory referral to Urology   Acute right-sided low back pain with right-sided sciatica   Relevant Medications   predniSONE  (DELTASONE ) 20 MG tablet   HYDROcodone -acetaminophen  (NORCO/VICODIN) 5-325 MG tablet    Assessment and Plan Assessment & Plan Kidney stones Persistent right-sided flank pain radiating to the groin, confirmed by CT scan showing two 4 mm stones. Pain affects sleep, previously managed with prednisone  and hydrocodone . - Referred to urology for further evaluation and management. - Prescribed 10 hydrocodone  tablets for pain. - Advised against concurrent use of hydrocodone  with acetaminophen . - Recommended acetaminophen  after hydrocodone  is finished.  Right-sided lumbago with sciatica Right-sided lower back pain radiating down the leg, suggestive of sciatica. Differential includes pain from kidney stones versus spinal origin. - Monitor symptoms. - Consider spinal x-ray if pain persists after urology evaluation.    Meds ordered this encounter  Medications   HYDROcodone -acetaminophen  (NORCO/VICODIN) 5-325 MG tablet    Sig: Take 2 tablets by mouth every 6 (six) hours as needed for severe pain (pain score 7-10).    Dispense:  10 tablet    Refill:  0    Supervising Provider:   DUANNE LOWERS T [[id number]]    Return if symptoms worsen or fail to improve.  Jeoffrey GORMAN Barrio, FNP [facility]

## 2024-09-28 ENCOUNTER — Other Ambulatory Visit: Payer: Self-pay

## 2024-09-28 ENCOUNTER — Emergency Department (HOSPITAL_COMMUNITY)

## 2024-09-28 ENCOUNTER — Emergency Department (HOSPITAL_COMMUNITY): Admission: EM | Admit: 2024-09-28 | Discharge: 2024-09-28 | Disposition: A | Discharge status: Home / Self Care

## 2024-09-28 ENCOUNTER — Ambulatory Visit: Payer: Self-pay | Admitting: Internal Medicine

## 2024-09-28 ENCOUNTER — Encounter (HOSPITAL_COMMUNITY): Payer: Self-pay | Admitting: Emergency Medicine

## 2024-09-28 ENCOUNTER — Encounter: Payer: Self-pay | Admitting: Internal Medicine

## 2024-09-28 DIAGNOSIS — R1031 Right lower quadrant pain: Secondary | ICD-10-CM | POA: Diagnosis not present

## 2024-09-28 DIAGNOSIS — R10A1 Flank pain, right side: Secondary | ICD-10-CM | POA: Diagnosis present

## 2024-09-28 DIAGNOSIS — M1611 Unilateral primary osteoarthritis, right hip: Secondary | ICD-10-CM | POA: Diagnosis not present

## 2024-09-28 DIAGNOSIS — G4734 Idiopathic sleep related nonobstructive alveolar hypoventilation: Secondary | ICD-10-CM | POA: Insufficient documentation

## 2024-09-28 LAB — URINALYSIS, ROUTINE W REFLEX MICROSCOPIC
Bacteria, UA: NONE SEEN
Bilirubin Urine: NEGATIVE
Glucose, UA: NEGATIVE mg/dL
Ketones, ur: NEGATIVE mg/dL
Leukocytes,Ua: NEGATIVE
Nitrite: NEGATIVE
Protein, ur: NEGATIVE mg/dL
Specific Gravity, Urine: 1.016 (ref 1.005–1.030)
pH: 5 (ref 5.0–8.0)

## 2024-09-28 LAB — CBC WITH DIFFERENTIAL/PLATELET
Abs Immature Granulocytes: 0.04 K/uL (ref 0.00–0.07)
Basophils Absolute: 0.1 K/uL (ref 0.0–0.1)
Basophils Relative: 1 %
Eosinophils Absolute: 0.1 K/uL (ref 0.0–0.5)
Eosinophils Relative: 1 %
HCT: 46.3 % (ref 39.0–52.0)
Hemoglobin: 15 g/dL (ref 13.0–17.0)
Immature Granulocytes: 0 %
Lymphocytes Relative: 23 %
Lymphs Abs: 2 K/uL (ref 0.7–4.0)
MCH: 29.9 pg (ref 26.0–34.0)
MCHC: 32.4 g/dL (ref 30.0–36.0)
MCV: 92.2 fL (ref 80.0–100.0)
Monocytes Absolute: 0.8 K/uL (ref 0.1–1.0)
Monocytes Relative: 9 %
Neutro Abs: 5.9 K/uL (ref 1.7–7.7)
Neutrophils Relative %: 66 %
Platelets: 155 K/uL (ref 150–400)
RBC: 5.02 MIL/uL (ref 4.22–5.81)
RDW: 14.6 % (ref 11.5–15.5)
WBC: 8.9 K/uL (ref 4.0–10.5)
nRBC: 0 % (ref 0.0–0.2)

## 2024-09-28 LAB — BASIC METABOLIC PANEL WITH GFR
Anion gap: 10 (ref 5–15)
BUN: 27 mg/dL — ABNORMAL HIGH (ref 8–23)
CO2: 26 mmol/L (ref 22–32)
Calcium: 9.1 mg/dL (ref 8.9–10.3)
Chloride: 107 mmol/L (ref 98–111)
Creatinine, Ser: 1.39 mg/dL — ABNORMAL HIGH (ref 0.61–1.24)
GFR, Estimated: 56 mL/min — ABNORMAL LOW (ref >60)
Glucose, Bld: 95 mg/dL (ref 70–99)
Potassium: 3.8 mmol/L (ref 3.5–5.1)
Sodium: 143 mmol/L (ref 135–145)

## 2024-09-28 LAB — LACTIC ACID, PLASMA
Lactic Acid, Venous: 1.6 mmol/L (ref 0.5–1.9)
Lactic Acid, Venous: 1.9 mmol/L (ref 0.5–1.9)

## 2024-09-28 MED ORDER — HYDROMORPHONE HCL 1 MG/ML IJ SOLN
0.5000 mg | Freq: Once | INTRAMUSCULAR | Status: AC
  Administered 2024-09-28: 0.5 mg via INTRAVENOUS
  Filled 2024-09-28: qty 0.5

## 2024-09-28 MED ORDER — HYDROMORPHONE HCL 1 MG/ML IJ SOLN
1.0000 mg | Freq: Once | INTRAMUSCULAR | Status: AC
  Administered 2024-09-28: 1 mg via INTRAVENOUS
  Filled 2024-09-28: qty 1

## 2024-09-28 MED ORDER — METHOCARBAMOL 500 MG PO TABS
1000.0000 mg | ORAL_TABLET | Freq: Once | ORAL | Status: AC
  Administered 2024-09-28: 1000 mg via ORAL
  Filled 2024-09-28: qty 2

## 2024-09-28 MED ORDER — METHOCARBAMOL 500 MG PO TABS: 1000.0000 mg | ORAL_TABLET | Freq: Four times a day (QID) | ORAL | 0 refills | Status: DC | PRN

## 2024-09-28 MED ORDER — HYDROCODONE-ACETAMINOPHEN 5-325 MG PO TABS: 2.0000 | ORAL_TABLET | ORAL | 0 refills | Status: AC | PRN

## 2024-09-28 MED ORDER — LACTATED RINGERS IV BOLUS
500.0000 mL | Freq: Once | INTRAVENOUS | Status: AC
  Administered 2024-09-28: 500 mL via INTRAVENOUS

## 2024-09-28 MED ORDER — KETOROLAC TROMETHAMINE 15 MG/ML IJ SOLN
15.0000 mg | Freq: Once | INTRAMUSCULAR | Status: AC
  Administered 2024-09-28: 15 mg via INTRAVENOUS
  Filled 2024-09-28: qty 1

## 2024-09-28 MED ORDER — IOHEXOL 350 MG/ML SOLN
100.0000 mL | Freq: Once | INTRAVENOUS | Status: AC | PRN
Start: 2024-09-28 — End: 2024-09-28
  Administered 2024-09-28: 100 mL via INTRAVENOUS

## 2024-09-28 NOTE — ED Triage Notes (Signed)
 Pt bib pov w/ c/o right side groin pain intermittently for 2-3 weeks. Pt reports pain became constant Thursday. Pt has been seen for same with no relief by several providers. Pt reports this pain has effected his mobility.

## 2024-09-28 NOTE — ED Provider Notes (Signed)
 [place] [facility] DEPARTMENT AT [facility] Provider Note   CSN: [id number] Arrival date & time: 09/28/24  9183     Patient presents with: Groin Pain   Aaron Clarke is a 68 y.o. male.   68 year old male presents for evaluation of groin pain.  States has been going for few weeks but getting worse.  States he is also having some right flank pain as well.  He admits to some nausea and difficulty with urinating as well as difficulty with bowel movements.  Denies any other symptoms or concerns.   Groin Pain Pertinent negatives include no chest pain, no abdominal pain and no shortness of breath.       Prior to Admission medications   Medication Sig Start Date End Date Taking? Authorizing Provider  HYDROcodone -acetaminophen  (NORCO/VICODIN) 5-325 MG tablet Take 2 tablets by mouth every 4 (four) hours as needed for up to 4 days. 09/28/24 10/02/24 Yes Itzabella Sorrels L, DO  methocarbamol  (ROBAXIN ) 500 MG tablet Take 2 tablets (1,000 mg total) by mouth every 6 (six) hours as needed for muscle spasms. 09/28/24  Yes Shamiyah Ngu L, DO  albuterol  (PROVENTIL ) (2.5 MG/3ML) 0.083% nebulizer solution Take 3 mLs (2.5 mg total) by nebulization every 6 (six) hours as needed for wheezing or shortness of breath. 12/27/23   Patsey Lot, MD  albuterol  (VENTOLIN  HFA) 108 [phone] Base) MCG/ACT inhaler Inhale 1-2 puffs into the lungs every 6 (six) hours as needed for wheezing or shortness of breath. 08/20/22   Billy Asberry FALCON, PA-C  bisoprolol  (ZEBETA ) 5 MG tablet TAKE 1 TABLET BY MOUTH DAILY 09/09/24   Wert, Michael B, MD  budesonide -formoterol  (SYMBICORT ) 160-4.5 MCG/ACT inhaler USE 2 INHALATIONS BY MOUTH FIRST THING IN THE MORNING AND THEN  ANOTHER 2 INHALATIONS AFTER  ABOUT 12 HOURS LATER 09/09/24   Darlean Ozell NOVAK, MD  lidocaine  (LIDODERM ) 5 % Place 1 patch onto the skin daily.    [provider]  pantoprazole  (PROTONIX ) 40 MG tablet Take 40 mg by mouth daily before breakfast.     [provider]  pravastatin (PRAVACHOL) 10 MG tablet Take 10 mg by mouth daily. 11/29/13   [provider]  predniSONE  (DELTASONE ) 20 MG tablet Take 20 mg by mouth daily with breakfast. 11/29/13   [provider]  rosuvastatin (CRESTOR) 10 MG tablet Take 1 tablet (10 mg total) by mouth daily. 08/22/24   Rana Lum CROME, NP  valsartan -hydrochlorothiazide  (DIOVAN  HCT) 160-12.5 MG tablet Take 1 tablet by mouth daily. 09/24/24   Hope Almarie ORN, NP    Allergies: Patient has no known allergies.    Review of Systems  Constitutional:  Negative for chills and fever.  HENT:  Negative for ear pain and sore throat.   Eyes:  Negative for pain and visual disturbance.  Respiratory:  Negative for cough and shortness of breath.   Cardiovascular:  Negative for chest pain and palpitations.  Gastrointestinal:  Positive for constipation and nausea. Negative for abdominal pain, blood in stool and vomiting.  Genitourinary:  Positive for dysuria. Negative for hematuria.       Admits testicular pain  Musculoskeletal:  Negative for arthralgias and back pain.  Skin:  Negative for color change and rash.  Neurological:  Negative for seizures and syncope.  All other systems reviewed and are negative.   Updated Vital Signs BP (!) 140/87   Pulse 65   Temp 98.1 F (36.7 C) (Oral)   Resp 17   Ht 6' 1 (1.854 m)  Wt (!) 145 kg   SpO2 97%   BMI 42.17 kg/m   Physical Exam Vitals and nursing note reviewed.  Constitutional:      General: He is not in acute distress.    Appearance: He is well-developed.  HENT:     Head: Normocephalic and atraumatic.  Eyes:     Conjunctiva/sclera: Conjunctivae normal.  Cardiovascular:     Rate and Rhythm: Normal rate and regular rhythm.     Heart sounds: No murmur heard. Pulmonary:     Effort: Pulmonary effort is normal. No respiratory distress.     Breath sounds: Normal breath sounds.  Abdominal:     Palpations: Abdomen is soft.      Tenderness: There is abdominal tenderness.  Genitourinary:    Penis: Normal.      Testes: Normal.  Musculoskeletal:        General: No swelling.     Cervical back: Neck supple.  Skin:    General: Skin is warm and dry.     Capillary Refill: Capillary refill takes less than 2 seconds.  Neurological:     Mental Status: He is alert.  Psychiatric:        Mood and Affect: Mood normal.     (all labs ordered are listed, but only abnormal results are displayed) Labs Reviewed  BASIC METABOLIC PANEL WITH GFR - Abnormal; Notable for the following components:      Result Value   BUN 27 (*)    Creatinine, Ser 1.39 (*)    GFR, Estimated 56 (*)    All other components within normal limits  URINALYSIS, ROUTINE W REFLEX MICROSCOPIC - Abnormal; Notable for the following components:   Hgb urine dipstick SMALL (*)    All other components within normal limits  CBC WITH DIFFERENTIAL/PLATELET  LACTIC ACID, PLASMA  LACTIC ACID, PLASMA    EKG: None  Radiology: CT Hip Right Wo Contrast Result Date: 09/28/2024 EXAM: CT OF THE RIGHT HIP WITHOUT IV CONTRAST 09/28/2024 01:06:59 PM TECHNIQUE: CT of the right hip was performed without the administration of intravenous contrast. Multiplanar reformatted images are provided for review. Automated exposure control, iterative reconstruction, and/or weight based adjustment of the mA/kV was utilized to reduce the radiation dose to as low as reasonably achievable. COMPARISON: Right hip radiograph dated 09/28/2024. CLINICAL HISTORY: Pain. Concern for possible right pubic rami fx on xray. FINDINGS: BONES/JOINT: No acute fracture or dislocation. Status post right total hip arthroplasty. Hardware is intact with normal alignment. The visualized right sacroiliac joint is anatomically aligned with mild degenerative arthropathy. The pubic symphysis is anatomically aligned. SOFT TISSUE: No significant soft tissue edema or fluid collections. No enlarged lymph nodes identified in  the field of view. INTRAPELVIC CONTENTS: Excreted contrast is noted within the bladder. Limited images of the intrapelvic contents are otherwise unremarkable. IMPRESSION: 1. No acute fracture or dislocation. 2. Intact right total hip arthroplasty with normal alignment. 3. Mild degenerative arthropathy of the visualized right sacroiliac joint. Electronically signed by: Harrietta Sherry MD 09/28/2024 01:15 PM EST RP Workstation: [id number]   DG Hip Unilat With Pelvis 2-3 Views Right Result Date: 09/28/2024 EXAM: 2 OR MORE VIEW(S) XRAY OF THE RIGHT HIP 09/28/2024 11:55:19 AM COMPARISON: 10/27/2023 CLINICAL HISTORY: right hip pain recent sx FINDINGS: BONES AND JOINTS: Right hip arthroplasty in place with components in expected location. Question nondisplaced right inferior pubic ramus fracture. Degenerative changes in left hip. LUMBAR SPINE: Degenerative changes in lower lumbar spine. SOFT TISSUES: Evaluation is limited by soft tissue overlap.  IMPRESSION: 1. Question nondisplaced right inferior pubic ramus fracture; evaluation limited by soft tissue overlap. Recommend CT for further evaluation. 2. Right hip arthroplasty with components in expected location. Electronically signed by: Norman Gatlin MD 09/28/2024 12:15 PM EST RP Workstation: [id number]   CT ABDOMEN PELVIS W CONTRAST Result Date: 09/28/2024 EXAM: CT ABDOMEN AND PELVIS WITH CONTRAST 09/28/2024 10:36:23 AM TECHNIQUE: CT of the abdomen and pelvis was performed with the administration of 100 mL of iohexol  (OMNIPAQUE ) 350 MG/ML injection. Multiplanar reformatted images are provided for review. Automated exposure control, iterative reconstruction, and/or weight-based adjustment of the mA/kV was utilized to reduce the radiation dose to as low as reasonably achievable. COMPARISON: CT Abdomen and Pelvis dated 09/16/2020. Unenhanced study dated 09/16/2024. CLINICAL HISTORY: right flank, RLQ, right groin pain FINDINGS: LOWER CHEST: Calcified granuloma identified  in the left lower lobe. LIVER: The liver is unremarkable. GALLBLADDER AND BILE DUCTS: Status post cholecystectomy. No significant bile duct dilatation. SPLEEN: Scattered calcified granulomas throughout the spleen. PANCREAS: Compared with the unenhanced study from 09/16/2024, there is a stable enhancement lesion within the anterior pancreatic neck measuring 1.4 cm (image 33/2). 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 No pancreatic mass or main duct dilatation. ADRENAL GLANDS: Normal size and morphology bilaterally. No nodule, thickening, or hemorrhage. No periadrenal stranding. KIDNEYS, URETERS AND BLADDER: Right renal atrophy. Bilateral Bosniak class I and II kidney cysts. The largest arises off the inferior pole of the left kidney measuring 3.2 cm. No follow-up imaging recommended. A 5 mm stone is identified within the upper pole of the right kidney. No hydronephrosis. No perinephric or periureteral stranding. Urinary bladder is unremarkable. GI AND BOWEL: Stomach demonstrates no acute abnormality. The appendix is visualized and normal in caliber, without wall thickening, periappendiceal inflammation, or fluid. Colonic diverticulosis. No signs of acute diverticulitis. There is no bowel obstruction. PERITONEUM AND RETROPERITONEUM: No ascites. No free air. VASCULATURE: Aorta is normal in caliber. LYMPH NODES: No lymphadenopathy. REPRODUCTIVE ORGANS: The prostate gland is unremarkable. BONES AND SOFT TISSUES: Lumbar spondylosis. Similar appearance of mild L4 compression deformity. Multilevel lumbar degenerative disc disease. No acute osseous abnormality. No focal soft tissue abnormality. IMPRESSION: 1. 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 Recommend nonemergent referral to oncology for further management. 2. Nonobstructing right renal calculi. Electronically signed by: Waddell Calk MD 09/28/2024 11:01 AM EST RP Workstation:  [id number]   US  SCROTUM W/DOPPLER Result Date: 09/28/2024 EXAM: ULTRASOUND SCROTUM/TESTICLES WITH DOPPLER FLOW EVALUATION 09/28/2024 10:11:55 AM TECHNIQUE: Duplex ultrasound using B-mode/gray scaled imaging, Doppler spectral analysis and color flow Doppler was obtained of the testicles. COMPARISON: None available. CLINICAL HISTORY: Intermittent right groin pain for 2-3 weeks. FINDINGS: RIGHT: MEASUREMENTS: Right testicle measures 3.7 x 1.7 x 2.8 cm. GREY SCALE: The right testicle demonstrates normal homogeneous echotexture without focal lesion. No testicular microlithiasis. DOPPLER EVALUATION: There is normal arterial and venous Doppler flow within the testicle. VARICOCELE: No scrotal varicocele. SCROTAL SAC: No hydrocele. EPIDIDYMIS: No acute abnormality. LEFT: MEASUREMENTS: Left testicle measures 4.0 x 1.8 x 2.9 cm. GREY SCALE: The left testicle demonstrates normal homogeneous echotexture without focal lesion. No testicular microlithiasis. DOPPLER EVALUATION: There is normal arterial and venous Doppler flow within the testicle. VARICOCELE: No scrotal varicocele. SCROTAL SAC: No hydrocele. EPIDIDYMIS: 2 small cysts noted within the left epididymis measuring up to 6 mm. IMPRESSION: 1. No acute testicular abnormality to explain intermittent right groin pain. 2. Two small left epididymal cysts, up to 6 mm. Electronically signed by: Waddell  Stroud MD 09/28/2024 10:36 AM EST RP Workstation: [id number]     Procedures   Medications Ordered in the ED  lactated ringers  bolus 500 mL (0 mLs Intravenous Stopped 09/28/24 1015)  HYDROmorphone  (DILAUDID ) injection 0.5 mg (0.5 mg Intravenous Given 09/28/24 0932)  HYDROmorphone  (DILAUDID ) injection 0.5 mg (0.5 mg Intravenous Given 09/28/24 1016)  iohexol  (OMNIPAQUE ) 350 MG/ML injection 100 mL (100 mLs Intravenous Contrast Given 09/28/24 1030)  methocarbamol  (ROBAXIN ) tablet 1,000 mg (1,000 mg Oral Given 09/28/24 1137)  ketorolac (TORADOL) 15 MG/ML injection 15 mg (15 mg  Intravenous Given 09/28/24 1138)  HYDROmorphone  (DILAUDID ) injection 1 mg (1 mg Intravenous Given 09/28/24 1228)  HYDROmorphone  (DILAUDID ) injection 0.5 mg (0.5 mg Intravenous Given 09/28/24 1339)                                    Medical Decision Making Cardiac monitor interpretation: Sinus rhythm, no ectopy  Patient here for ongoing right groin and hip pain.  CT scans are negative for acute abnormality and ultrasound is negative.  He did have an x-ray that showed possible fracture of the pubic rami but this was not confirmed on CT.  CT was negative for fracture.  He does however have pretty significant arthropathy on that side.  He was given multiple doses of Dilaudid  here as well as Robaxin  and Toradol.  I will give him a prescription for Norco and Robaxin .  Advise very close follow-up with his orthopedic surgeon and primary care and return to the ER for any new or worsening symptoms.  He feels comfortable with the plan we discharged home.  All results and plan discussed with patient and wife at bedside.  Problems Addressed: Arthropathy of right hip: chronic illness or injury with exacerbation, progression, or side effects of treatment Right inguinal pain: acute illness or injury  Amount and/or Complexity of Data Reviewed External Data Reviewed: notes.    Details: Prior ED records reviewed and patient has been seen for this previously, given prescription for Norco Labs: ordered. Decision-making details documented in ED Course.    Details: Ordered and reviewed by me and unremarkable Radiology: ordered and independent interpretation performed. Decision-making details documented in ED Course.    Details: Ordered and reviewed by me CT abdomen pelvis: Shows no acute abnormality Ultrasound of the scrotum shows no acute abnormality Right hip x-ray shows possible pubic rami fracture Right hip CT: Shows no acute abnormality and no evidence of fracture but does show arthropathy  Risk OTC  drugs. Prescription drug management. Parenteral controlled substances. Drug therapy requiring intensive monitoring for toxicity.     Final diagnoses:  Right inguinal pain  Arthropathy of right hip    ED Discharge Orders          Ordered    methocarbamol  (ROBAXIN ) 500 MG tablet  Every 6 hours PRN        09/28/24 1336    HYDROcodone -acetaminophen  (NORCO/VICODIN) 5-325 MG tablet  Every 4 hours PRN        09/28/24 1336               Tonianne Fine L, DO 09/28/24 1440

## 2024-09-28 NOTE — Discharge Instructions (Addendum)
 Follow-up with your primary care doctor and your orthopedic surgeon.  Call the office tomorrow to make an appointment.  Take your Robaxin  up to 4 times a day as needed and use your Norco as needed for pain.  You can also take occasional ibuprofen as needed for pain.  Return to the ER for any new or worsening symptoms.

## 2024-09-29 NOTE — Telephone Encounter (Signed)
 Called and relayed results to pt, pt confirmed understanding

## 2024-10-01 ENCOUNTER — Encounter (HOSPITAL_COMMUNITY): Payer: Self-pay | Admitting: *Deleted

## 2024-10-01 ENCOUNTER — Other Ambulatory Visit: Payer: Self-pay

## 2024-10-01 ENCOUNTER — Emergency Department (HOSPITAL_COMMUNITY)

## 2024-10-01 ENCOUNTER — Emergency Department (HOSPITAL_COMMUNITY)
Admission: EM | Admit: 2024-10-01 | Discharge: 2024-10-02 | Disposition: A | Discharge status: Home / Self Care | Attending: Emergency Medicine | Admitting: Emergency Medicine

## 2024-10-01 DIAGNOSIS — S80811A Abrasion, right lower leg, initial encounter: Secondary | ICD-10-CM | POA: Diagnosis not present

## 2024-10-01 DIAGNOSIS — M5410 Radiculopathy, site unspecified: Secondary | ICD-10-CM | POA: Diagnosis not present

## 2024-10-01 DIAGNOSIS — S8991XA Unspecified injury of right lower leg, initial encounter: Secondary | ICD-10-CM | POA: Diagnosis present

## 2024-10-01 DIAGNOSIS — W1789XA Other fall from one level to another, initial encounter: Secondary | ICD-10-CM

## 2024-10-01 DIAGNOSIS — M25551 Pain in right hip: Secondary | ICD-10-CM | POA: Diagnosis not present

## 2024-10-01 MED ORDER — OXYCODONE-ACETAMINOPHEN 5-325 MG PO TABS
1.0000 | ORAL_TABLET | Freq: Once | ORAL | Status: AC
  Administered 2024-10-01: 1 via ORAL
  Filled 2024-10-01: qty 1

## 2024-10-01 MED ORDER — TRAMADOL HCL 50 MG PO TABS: 50.0000 mg | ORAL_TABLET | Freq: Four times a day (QID) | ORAL | 0 refills | Status: DC | PRN

## 2024-10-01 MED ORDER — ACETAMINOPHEN 500 MG PO TABS
1000.0000 mg | ORAL_TABLET | Freq: Once | ORAL | Status: AC
  Administered 2024-10-01: 1000 mg via ORAL
  Filled 2024-10-01: qty 2

## 2024-10-01 MED ORDER — TRAMADOL HCL 50 MG PO TABS
50.0000 mg | ORAL_TABLET | Freq: Once | ORAL | Status: AC
Start: 2024-10-01 — End: 2024-10-01
  Administered 2024-10-01: 50 mg via ORAL
  Filled 2024-10-01: qty 1

## 2024-10-01 MED ORDER — PREDNISONE 20 MG PO TABS: ORAL_TABLET | ORAL | 0 refills | Status: DC

## 2024-10-01 MED ORDER — PREDNISONE 50 MG PO TABS
60.0000 mg | ORAL_TABLET | Freq: Once | ORAL | Status: AC
  Administered 2024-10-01: 60 mg via ORAL
  Filled 2024-10-01: qty 1

## 2024-10-01 NOTE — ED Triage Notes (Signed)
 Pt fell while trying to get in his truck today, landing on rt knee. Pt with continued pain to rt groin area. Denies hitting his head with the fall. + blood thinners. Able to put weight on his knee but difficult to ambulate.

## 2024-10-01 NOTE — ED Provider Notes (Addendum)
 [place] EMERGENCY DEPARTMENT AT [facility] Provider Note   CSN: [id number] Arrival date & time: 10/01/24  1840     Patient presents with: Aaron Clarke is a 68 y.o. male.   Pt with c/o fall/near fall getting out of truck today. C/o increased in pain to right hip, right femur, and right lower leg post episode. Did not fall all the way to ground. Did not hit head. Has had chronic pain to right hip and buttock/lower back area for the past several weeks. Remote hx prior tha. No new back/neck injury or pain. No saddle area or leg numbness. No weakness to lower extremities. No problems w normal bowel and bladder function.  Superficial abrasion to right anterior lower leg, tetanus is up to date. Pt notes recent prior ED evals for his hip pain. Denies leg swelling. No skin changes, redness or  rash to area of pain. No fever or chills.  No anticoagulant  therapy.   The history is provided by the patient and medical records.  Fall Pertinent negatives include no chest pain, no abdominal pain, no headaches and no shortness of breath.       Prior to Admission medications   Medication Sig Start Date End Date Taking? Authorizing Provider  predniSONE  (DELTASONE ) 20 MG tablet 3 po once a day for 2 days, then 2 po once a day for 3 days, then 1 po once a day for 3 days 10/02/24  Yes Seneca Gadbois, MD  traMADol (ULTRAM) 50 MG tablet Take 1 tablet (50 mg total) by mouth every 6 (six) hours as needed. 10/01/24  Yes Bernard Drivers, MD  albuterol  (PROVENTIL ) (2.5 MG/3ML) 0.083% nebulizer solution Take 3 mLs (2.5 mg total) by nebulization every 6 (six) hours as needed for wheezing or shortness of breath. 12/27/23   Patsey Lot, MD  albuterol  (VENTOLIN  HFA) [phone] Base) MCG/ACT inhaler Inhale 1-2 puffs into the lungs every 6 (six) hours as needed for wheezing or shortness of breath. 08/20/22   Billy Asberry FALCON, PA-C  bisoprolol  (ZEBETA ) 5 MG tablet TAKE 1 TABLET BY MOUTH DAILY  09/09/24   Wert, Michael B, MD  budesonide -formoterol  (SYMBICORT ) 160-4.5 MCG/ACT inhaler USE 2 INHALATIONS BY MOUTH FIRST THING IN THE MORNING AND THEN  ANOTHER 2 INHALATIONS AFTER  ABOUT 12 HOURS LATER 09/09/24   Wert, Michael B, MD  HYDROcodone -acetaminophen  (NORCO/VICODIN) 5-325 MG tablet Take 2 tablets by mouth every 4 (four) hours as needed for up to 4 days. 09/28/24 10/02/24  Kammerer, Duwaine L, DO  lidocaine  (LIDODERM ) 5 % Place 1 patch onto the skin daily.    [provider]  methocarbamol  (ROBAXIN ) 500 MG tablet Take 2 tablets (1,000 mg total) by mouth every 6 (six) hours as needed for muscle spasms. 09/28/24   Kammerer, Megan L, DO  pantoprazole  (PROTONIX ) 40 MG tablet Take 40 mg by mouth daily before breakfast.    [provider]  pravastatin (PRAVACHOL) 10 MG tablet Take 10 mg by mouth daily. 11/29/13   [provider]  rosuvastatin (CRESTOR) 10 MG tablet Take 1 tablet (10 mg total) by mouth daily. 08/22/24   Rana Lum CROME, NP  valsartan -hydrochlorothiazide  (DIOVAN  HCT) 160-12.5 MG tablet Take 1 tablet by mouth daily. 09/24/24   Hope Almarie ORN, NP    Allergies: Patient has no known allergies.    Review of Systems  Constitutional:  Negative for chills and fever.  Respiratory:  Negative for cough and shortness of breath.   Cardiovascular:  Negative for chest pain and leg swelling.  Gastrointestinal:  Negative for abdominal pain, diarrhea, nausea and vomiting.  Genitourinary:  Negative for dysuria, flank pain, hematuria, scrotal swelling and testicular pain.  Musculoskeletal:  Negative for neck pain and neck stiffness.  Skin:  Negative for rash.  Neurological:  Negative for syncope, weakness, numbness and headaches.  Psychiatric/Behavioral:  Negative for confusion.     Updated Vital Signs BP 122/77 (BP Location: Right Arm)   Pulse 70   Temp 98 F (36.7 C) (Oral)   Resp 16   Ht 1.854 m (6' 1)   Wt (!) 145 kg   SpO2 93%   BMI 42.17 kg/m    Physical Exam Vitals and nursing note reviewed.  Constitutional:      Appearance: Normal appearance. He is well-developed.  HENT:     Head: Atraumatic.     Nose: Nose normal.     Mouth/Throat:     Mouth: Mucous membranes are moist.     Pharynx: Oropharynx is clear.  Eyes:     General: No scleral icterus.    Conjunctiva/sclera: Conjunctivae normal.     Pupils: Pupils are equal, round, and reactive to light.  Neck:     Trachea: No tracheal deviation.  Cardiovascular:     Rate and Rhythm: Normal rate and regular rhythm.     Pulses: Normal pulses.     Heart sounds: Normal heart sounds. No murmur heard.    No friction rub. No gallop.  Pulmonary:     Effort: Pulmonary effort is normal. No accessory muscle usage or respiratory distress.     Breath sounds: Normal breath sounds.  Chest:     Chest wall: No tenderness.  Abdominal:     General: Bowel sounds are normal. There is no distension.     Palpations: Abdomen is soft. There is no mass.     Tenderness: There is no abdominal tenderness. There is no guarding or rebound.     Hernia: No hernia is present.     Comments: No abd bruising or contusion  Genitourinary:    Comments: No cva tenderness. Normal external gu exam, no scrotal or testicular pain/swelling or tenderness.  Musculoskeletal:        General: No swelling or tenderness.     Cervical back: Normal range of motion and neck supple. No tenderness.     Comments: CTLS spine, non tender, aligned, no step off. Some tenderness right sciatic notch area. Pain/tenderness with palpation and active rom right hip and femur and lower leg. No swelling noted to RLE. RLE is of normal color and warmth, with intact distal pulses. No pain w passive rom at right hip, knee or ankle. Small superficial abrasion to right anterior lower leg without sign of infection.    Skin:    General: Skin is warm and dry.     Findings: No rash.  Neurological:     Mental Status: He is alert.     Comments:  Alert, speech clear. GCS 15. Motor/sens grossly intact bil. Stre 5/5. Sens intact.   Psychiatric:        Mood and Affect: Mood normal.     (all labs ordered are listed, but only abnormal results are displayed) Labs Reviewed - No data to display  EKG: None  Radiology: DG Lumbar Spine Complete Result Date: 10/01/2024 EXAM: 4 VIEW(S) XRAY OF THE LUMBAR SPINE 10/01/2024 09:20:00 PM COMPARISON: None available. CLINICAL HISTORY: fall, pain FINDINGS: LUMBAR SPINE: BONES: No acute fracture. No aggressive  appearing osseous lesion. Alignment is normal. 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 DISCS AND DEGENERATIVE CHANGES: Multilevel spondylosis, disc space height loss, and degenerative endplate changes. These are most advanced at L2-L3 where there is ankylosis of the vertebral bodies. Moderate multilevel facet arthropathy. SOFT TISSUES: No acute abnormality. IMPRESSION: 1. No evidence of acute fracture. 2. Multilevel age advanced degenerative changes progressed from 81 / 4 / 57 . Electronically signed by: Norman Gatlin MD 10/01/2024 09:41 PM EST RP Workstation: [id number]   DG Tibia/Fibula Right Result Date: 10/01/2024 EXAM: _VIEWS_ VIEW(S) XRAY OF THE _LATERALITY_ TIBIA AND FIBULA 10/01/2024 09:20:00 PM COMPARISON: None available. CLINICAL HISTORY: fall, pain FINDINGS: BONES AND JOINTS: No acute fracture. No focal osseous lesion. No joint dislocation. Moderate degenerative changes of the Knee. SOFT TISSUES: The soft tissues are unremarkable. IMPRESSION: 1. No acute fracture or dislocation. Electronically signed by: Norman Gatlin MD 10/01/2024 09:38 PM EST RP Workstation: [id number]   DG Femur Min 2 Views Right Result Date: 10/01/2024 EXAM: 2 VIEW(S) XRAY OF THE FEMUR 10/01/2024 09:20:00 PM COMPARISON: None available. CLINICAL HISTORY: fall, pain FINDINGS: BONES AND JOINTS: No acute fracture. No focal osseous lesion. No joint dislocation. Right hip arthroplasty. No evidence of loosening. Moderate  tricompartmental degenerative changes of the knee. SOFT TISSUES: The soft tissues are unremarkable. IMPRESSION: 1. No acute fracture or dislocation. 2. Moderate tricompartmental degenerative changes of the knee. Electronically signed by: Norman Gatlin MD 10/01/2024 09:38 PM EST RP Workstation: [id number]   DG HIP UNILAT W OR W/O PELVIS 2-3 VIEWS RIGHT Result Date: 10/01/2024 EXAM: 2 or 3 VIEW(S) XRAY OF THE RIGHT HIP 10/01/2024 09:20:00 PM COMPARISON: None available. CLINICAL HISTORY: fall, pain FINDINGS: BONES AND JOINTS: Right hip arthroplasty. No evidence of loosening. No acute fracture or dislocation. SOFT TISSUES: The soft tissues are unremarkable. IMPRESSION: 1. No acute fracture or dislocation. Electronically signed by: Norman Gatlin MD 10/01/2024 09:36 PM EST RP Workstation: [id number]   DG Knee Complete 4 Views Right Result Date: 10/01/2024 CLINICAL DATA:  Recent fall with knee pain, initial encounter EXAM: RIGHT KNEE - COMPLETE 4+ VIEW COMPARISON:  None Available. FINDINGS: Tricompartmental degenerative changes are noted. No acute fracture or dislocation is noted. No joint effusion is seen. No soft tissue changes are noted. IMPRESSION: Degenerative change without acute abnormality. Electronically Signed   By: Oneil Devonshire M.D.   On: 10/01/2024 19:27     Procedures   Medications Ordered in the ED  predniSONE  (DELTASONE ) tablet 60 mg (60 mg Oral Given 10/01/24 2110)  acetaminophen  (TYLENOL ) tablet 1,000 mg (1,000 mg Oral Given 10/01/24 2110)  traMADol (ULTRAM) tablet 50 mg (50 mg Oral Given 10/01/24 2110)                                    Medical Decision Making Problems Addressed: Abrasion of right lower extremity, initial encounter: acute illness or injury Fall from stationary vehicle, initial encounter: acute illness or injury with systemic symptoms that poses a threat to life or bodily functions Radicular pain: acute illness or injury with systemic symptoms Right hip pain:  chronic illness or injury that poses a threat to life or bodily functions  Amount and/or Complexity of Data Reviewed External Data Reviewed: notes. Radiology: ordered and independent interpretation performed. Decision-making details documented in ED Course.  Risk OTC drugs. Prescription drug management.   Reviewed nursing notes and prior charts for additional history.  Recent prior imaging reviewed.  Xrays reviewed/interpreted by me - no fx.   Ultram po. Acetaminophen  po.  Pt appears stable for ED d/c.   ?possible nerve impingement/sciatica contributing to symptoms  - will give rx prednisone . Rec close pcp f/u, as well as ortho and spine f/u. Pt indicates he already has f/u arranged with his orthopedist in one week, as well as their spine specialist there.    Return precautions provided.       Final diagnoses:  Fall from stationary vehicle, initial encounter  Right hip pain  Radicular pain  Abrasion of right lower extremity, initial encounter    ED Discharge Orders          Ordered    predniSONE  (DELTASONE ) 20 MG tablet        10/01/24 2207    traMADol (ULTRAM) 50 MG tablet  Every 6 hours PRN        10/01/24 2207                Fay Swider, MD 10/01/24 2338

## 2024-10-01 NOTE — Discharge Instructions (Addendum)
 It was our pleasure to provide your ER care today - we hope that you feel better.  Avoid bending at waist or heavy lifting > 20 lbs for the next week.  Try gentle massage and/or heat  therapy to sore area. Take prednisone  as prescribed. You may also take ultram as need for pain - no driving for the next 6 hours, or if/when taking ultram.   Follow up closely with primary care doctor in the next 1-2 weeks.  Also follow up closely with your orthopedist, Dr Vernetta, as scheduled in ~ one week. When you follow up with Dr Nelle Care, also discuss with them follow up and/or evaluation by their spine specialist.   Return to ER if worse, new symptoms, fevers, severe/intractable pain, new numbness/weakness, problems with normal bowel and bladder control, or other concern.

## 2024-10-08 ENCOUNTER — Emergency Department (HOSPITAL_COMMUNITY)

## 2024-10-08 ENCOUNTER — Encounter (HOSPITAL_COMMUNITY): Payer: Self-pay | Admitting: *Deleted

## 2024-10-08 ENCOUNTER — Other Ambulatory Visit: Payer: Self-pay

## 2024-10-08 ENCOUNTER — Telehealth: Payer: Self-pay | Admitting: Orthopaedic Surgery

## 2024-10-08 ENCOUNTER — Observation Stay (HOSPITAL_COMMUNITY)
Admission: EM | Admit: 2024-10-08 | Discharge: 2024-10-09 | Disposition: A | Attending: Internal Medicine | Admitting: Internal Medicine

## 2024-10-08 DIAGNOSIS — M25551 Pain in right hip: Secondary | ICD-10-CM | POA: Diagnosis not present

## 2024-10-08 DIAGNOSIS — E860 Dehydration: Secondary | ICD-10-CM | POA: Insufficient documentation

## 2024-10-08 DIAGNOSIS — Z6841 Body Mass Index (BMI) 40.0 and over, adult: Secondary | ICD-10-CM | POA: Diagnosis not present

## 2024-10-08 DIAGNOSIS — K219 Gastro-esophageal reflux disease without esophagitis: Secondary | ICD-10-CM | POA: Insufficient documentation

## 2024-10-08 DIAGNOSIS — I10 Essential (primary) hypertension: Secondary | ICD-10-CM | POA: Diagnosis present

## 2024-10-08 DIAGNOSIS — E785 Hyperlipidemia, unspecified: Secondary | ICD-10-CM | POA: Insufficient documentation

## 2024-10-08 DIAGNOSIS — J441 Chronic obstructive pulmonary disease with (acute) exacerbation: Secondary | ICD-10-CM | POA: Insufficient documentation

## 2024-10-08 DIAGNOSIS — E66813 Obesity, class 3: Secondary | ICD-10-CM | POA: Insufficient documentation

## 2024-10-08 DIAGNOSIS — M79604 Pain in right leg: Secondary | ICD-10-CM

## 2024-10-08 DIAGNOSIS — G8929 Other chronic pain: Secondary | ICD-10-CM | POA: Insufficient documentation

## 2024-10-08 DIAGNOSIS — J4489 Other specified chronic obstructive pulmonary disease: Secondary | ICD-10-CM | POA: Diagnosis present

## 2024-10-08 DIAGNOSIS — Z7901 Long term (current) use of anticoagulants: Secondary | ICD-10-CM | POA: Diagnosis not present

## 2024-10-08 DIAGNOSIS — R5381 Other malaise: Secondary | ICD-10-CM | POA: Insufficient documentation

## 2024-10-08 DIAGNOSIS — N1831 Chronic kidney disease, stage 3a: Secondary | ICD-10-CM | POA: Insufficient documentation

## 2024-10-08 DIAGNOSIS — M5440 Lumbago with sciatica, unspecified side: Secondary | ICD-10-CM | POA: Insufficient documentation

## 2024-10-08 DIAGNOSIS — Z86718 Personal history of other venous thrombosis and embolism: Secondary | ICD-10-CM | POA: Insufficient documentation

## 2024-10-08 DIAGNOSIS — Z87891 Personal history of nicotine dependence: Secondary | ICD-10-CM | POA: Diagnosis not present

## 2024-10-08 DIAGNOSIS — Z79899 Other long term (current) drug therapy: Secondary | ICD-10-CM | POA: Insufficient documentation

## 2024-10-08 DIAGNOSIS — N179 Acute kidney failure, unspecified: Principal | ICD-10-CM | POA: Insufficient documentation

## 2024-10-08 DIAGNOSIS — N183 Chronic kidney disease, stage 3 unspecified: Secondary | ICD-10-CM | POA: Diagnosis present

## 2024-10-08 DIAGNOSIS — I129 Hypertensive chronic kidney disease with stage 1 through stage 4 chronic kidney disease, or unspecified chronic kidney disease: Secondary | ICD-10-CM | POA: Insufficient documentation

## 2024-10-08 DIAGNOSIS — M5430 Sciatica, unspecified side: Secondary | ICD-10-CM | POA: Diagnosis present

## 2024-10-08 LAB — COMPREHENSIVE METABOLIC PANEL WITH GFR
ALT: 34 U/L (ref 0–44)
AST: 26 U/L (ref 15–41)
Albumin: 4 g/dL (ref 3.5–5.0)
Alkaline Phosphatase: 53 U/L (ref 38–126)
Anion gap: 14 (ref 5–15)
BUN: 43 mg/dL — ABNORMAL HIGH (ref 8–23)
CO2: 25 mmol/L (ref 22–32)
Calcium: 8.7 mg/dL — ABNORMAL LOW (ref 8.9–10.3)
Chloride: 100 mmol/L (ref 98–111)
Creatinine, Ser: 2.34 mg/dL — ABNORMAL HIGH (ref 0.61–1.24)
GFR, Estimated: 30 mL/min — ABNORMAL LOW (ref >60)
Glucose, Bld: 109 mg/dL — ABNORMAL HIGH (ref 70–99)
Potassium: 4 mmol/L (ref 3.5–5.1)
Sodium: 139 mmol/L (ref 135–145)
Total Bilirubin: 0.9 mg/dL (ref 0.0–1.2)
Total Protein: 6.8 g/dL (ref 6.5–8.1)

## 2024-10-08 LAB — CBC WITH DIFFERENTIAL/PLATELET
Abs Immature Granulocytes: 0.08 K/uL — ABNORMAL HIGH (ref 0.00–0.07)
Basophils Absolute: 0 K/uL (ref 0.0–0.1)
Basophils Relative: 0 %
Eosinophils Absolute: 0.2 K/uL (ref 0.0–0.5)
Eosinophils Relative: 2 %
HCT: 49.1 % (ref 39.0–52.0)
Hemoglobin: 16.1 g/dL (ref 13.0–17.0)
Immature Granulocytes: 1 %
Lymphocytes Relative: 24 %
Lymphs Abs: 2.2 K/uL (ref 0.7–4.0)
MCH: 29.8 pg (ref 26.0–34.0)
MCHC: 32.8 g/dL (ref 30.0–36.0)
MCV: 90.8 fL (ref 80.0–100.0)
Monocytes Absolute: 0.8 K/uL (ref 0.1–1.0)
Monocytes Relative: 9 %
Neutro Abs: 5.7 K/uL (ref 1.7–7.7)
Neutrophils Relative %: 64 %
Platelets: 192 K/uL (ref 150–400)
RBC: 5.41 MIL/uL (ref 4.22–5.81)
RDW: 14.4 % (ref 11.5–15.5)
WBC: 9 K/uL (ref 4.0–10.5)
nRBC: 0 % (ref 0.0–0.2)

## 2024-10-08 MED ORDER — FLUTICASONE FUROATE-VILANTEROL 100-25 MCG/ACT IN AEPB
1.0000 | INHALATION_SPRAY | Freq: Every day | RESPIRATORY_TRACT | Status: DC
  Administered 2024-10-09: 1 via RESPIRATORY_TRACT
  Filled 2024-10-08: qty 28

## 2024-10-08 MED ORDER — APIXABAN 5 MG PO TABS
5.0000 mg | ORAL_TABLET | Freq: Two times a day (BID) | ORAL | Status: DC
  Administered 2024-10-08 – 2024-10-09 (×2): 5 mg via ORAL
  Filled 2024-10-08 (×2): qty 1

## 2024-10-08 MED ORDER — LACTATED RINGERS IV SOLN: INTRAVENOUS | Status: DC

## 2024-10-08 MED ORDER — OXYCODONE HCL 5 MG PO TABS
5.0000 mg | ORAL_TABLET | Freq: Four times a day (QID) | ORAL | Status: DC | PRN
  Administered 2024-10-08: 10 mg via ORAL
  Administered 2024-10-09: 5 mg via ORAL
  Filled 2024-10-08: qty 1
  Filled 2024-10-08: qty 2

## 2024-10-08 MED ORDER — POLYETHYLENE GLYCOL 3350 17 G PO PACK: 17.0000 g | PACK | Freq: Every day | ORAL | Status: DC | PRN

## 2024-10-08 MED ORDER — SODIUM CHLORIDE 0.9 % IV BOLUS
1000.0000 mL | Freq: Once | INTRAVENOUS | Status: AC
  Administered 2024-10-08: 1000 mL via INTRAVENOUS

## 2024-10-08 MED ORDER — ACETAMINOPHEN 500 MG PO TABS: 500.0000 mg | ORAL_TABLET | Freq: Four times a day (QID) | ORAL | Status: DC | PRN

## 2024-10-08 MED ORDER — HYDROMORPHONE HCL 1 MG/ML IJ SOLN
0.5000 mg | INTRAMUSCULAR | Status: DC | PRN
  Administered 2024-10-08: 0.5 mg via INTRAVENOUS
  Filled 2024-10-08: qty 0.5

## 2024-10-08 MED ORDER — ROSUVASTATIN CALCIUM 10 MG PO TABS
10.0000 mg | ORAL_TABLET | Freq: Every day | ORAL | Status: DC
  Administered 2024-10-09: 10 mg via ORAL
  Filled 2024-10-08: qty 1

## 2024-10-08 MED ORDER — OXYCODONE HCL 5 MG PO TABS: 5.0000 mg | ORAL_TABLET | Freq: Four times a day (QID) | ORAL | Status: DC | PRN

## 2024-10-08 MED ORDER — PROCHLORPERAZINE EDISYLATE 10 MG/2ML IJ SOLN: 5.0000 mg | Freq: Four times a day (QID) | INTRAMUSCULAR | Status: DC | PRN

## 2024-10-08 MED ORDER — ENOXAPARIN SODIUM 80 MG/0.8ML IJ SOSY: 70.0000 mg | PREFILLED_SYRINGE | INTRAMUSCULAR | Status: DC

## 2024-10-08 MED ORDER — PANTOPRAZOLE SODIUM 40 MG PO TBEC
40.0000 mg | DELAYED_RELEASE_TABLET | Freq: Every day | ORAL | Status: DC
  Administered 2024-10-09: 40 mg via ORAL
  Filled 2024-10-08: qty 1

## 2024-10-08 MED ORDER — HYDROMORPHONE HCL 1 MG/ML IJ SOLN
0.5000 mg | INTRAMUSCULAR | Status: DC | PRN
  Administered 2024-10-09 (×2): 0.5 mg via INTRAVENOUS
  Filled 2024-10-08 (×2): qty 0.5

## 2024-10-08 MED ORDER — MELATONIN 3 MG PO TABS: 3.0000 mg | ORAL_TABLET | Freq: Every evening | ORAL | Status: DC | PRN

## 2024-10-08 NOTE — Telephone Encounter (Signed)
 Patient wife called and said that her husband can't walk and the patient said he feels like his hip is going to break if he do any movement. They stated they don't know what else to do. [phone]

## 2024-10-08 NOTE — Telephone Encounter (Signed)
 Talked with patient and we will cancel his appt for tomorrow and go ahead and order these MRI's for him

## 2024-10-08 NOTE — H&P (Addendum)
 History and Physical  Aaron Clarke FMW:[id number] DOB: Oct 26, 1956 DOA: 10/08/2024  Referring physician: Dr. Suzette, EDP  PCP: Kayla Jeoffrey RAMAN, FNP  Outpatient Specialists: Pulmonary, cardiology. Patient coming from: Home.  Chief Complaint: Right hip pain.  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  He had a fall 1 week ago while trying to get into his truck.  He owns and runs a dealer.  Denies any trauma or twisting to his back or hips.  The pain has kept him up at night as he can't find a comfortable position in bed.  He presents to the ER to get a solution to his pain so he can return to working as usual.  In the ER, vital signs are stable.  EDP discussed the case with orthopedic surgery who recommended MRI of right hip and lumbar spine which were ordered by EDP.  Lab studies were notable for elevated BUN and creatinine above baseline.  EDP requested admission for management of right hip pain and AKI.  Admitted by [facility], hospitalist service.  ED Course: Temperature 98.2.  BP 111/73, pulse 65, respiration rate 18, O2 saturation 95% on room air.  Review of Systems: Review of systems as noted in the HPI. All other systems reviewed and are negative.   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   Family history of breast cancer    mother   Hypertension    Osteoarthritis of right hip    PE (pulmonary thromboembolism) (HCC)  10/23/2022   Past Surgical History:  Procedure Laterality Date   CHOLECYSTECTOMY  12/19/2011   Procedure: LAPAROSCOPIC CHOLECYSTECTOMY WITH INTRAOPERATIVE CHOLANGIOGRAM;  Surgeon: Elon CHRISTELLA Pacini, MD;  Location: [facility] OR;  Service: General;  Laterality: N/A;   COLONOSCOPY WITH PROPOFOL  N/A 09/13/2022   Procedure: COLONOSCOPY WITH PROPOFOL ;  Surgeon: Unk Corinn Skiff, MD;  Location: [facility] ENDOSCOPY;  Service: Gastroenterology;  Laterality: N/A;   Colonscopy     Fatty tumor  2003   L chest   LEFT HEART CATH AND CORONARY ANGIOGRAPHY N/A 08/01/2024   Procedure: LEFT HEART CATH AND CORONARY ANGIOGRAPHY;  Surgeon: Anner Alm ORN, MD;  Location[facility] INVASIVE CV LAB;  Service: Cardiovascular;  Laterality: N/A;   TOTAL HIP ARTHROPLASTY Right 02/19/2024   Procedure: ARTHROPLASTY, HIP, TOTAL, ANTERIOR APPROACH;  Surgeon: Vernetta Lonni GRADE, MD;  Location: [facility] OR;  Service: Orthopedics;  Laterality: Right;    Social History:  reports that he quit smoking about 36 years ago. His smoking use included cigarettes. He started smoking about 50 years ago. He has never used smokeless tobacco. He reports that he does not currently use alcohol. He reports that he does not use drugs.   No Known Allergies  Family History  Problem Relation Age of Onset   Cancer Mother        breast   Cancer Father        colon   Heart disease Father    Asthma Sister  Anesthesia problems Neg Hx       Prior to Admission medications   Medication Sig Start Date End Date Taking? Authorizing Provider  albuterol  (PROVENTIL ) (2.5 MG/3ML) 0.083% nebulizer solution Take 3 mLs (2.5 mg total) by nebulization every 6 (six) hours as needed for wheezing or shortness of breath. 12/27/23   Patsey Lot, MD  albuterol  (VENTOLIN  HFA) 108 (90 Base) MCG/ACT inhaler Inhale 1-2 puffs into the lungs every 6 (six) hours as needed for wheezing or shortness of breath. 08/20/22   Billy Asberry FALCON, PA-C  bisoprolol  (ZEBETA ) 5 MG tablet TAKE 1  TABLET BY MOUTH DAILY 09/09/24   Wert, Michael B, MD  budesonide -formoterol  (SYMBICORT ) 160-4.5 MCG/ACT inhaler USE 2 INHALATIONS BY MOUTH FIRST THING IN THE MORNING AND THEN  ANOTHER 2 INHALATIONS AFTER  ABOUT 12 HOURS LATER 09/09/24   Darlean Ozell NOVAK, MD  lidocaine  (LIDODERM ) 5 % Place 1 patch onto the skin daily.    [provider]  methocarbamol  (ROBAXIN ) 500 MG tablet Take 2 tablets (1,000 mg total) by mouth every 6 (six) hours as needed for muscle spasms. 09/28/24   Kammerer, Megan L, DO  pantoprazole  (PROTONIX ) 40 MG tablet Take 40 mg by mouth daily before breakfast.    [provider]  pravastatin (PRAVACHOL) 10 MG tablet Take 10 mg by mouth daily. 11/29/13   [provider]  predniSONE  (DELTASONE ) 20 MG tablet 3 po once a day for 2 days, then 2 po once a day for 3 days, then 1 po once a day for 3 days 10/02/24   Steinl, Kevin, MD  rosuvastatin (CRESTOR) 10 MG tablet Take 1 tablet (10 mg total) by mouth daily. 08/22/24   Rana Lum CROME, NP  traMADol (ULTRAM) 50 MG tablet Take 1 tablet (50 mg total) by mouth every 6 (six) hours as needed. 10/01/24   Bernard Drivers, MD  valsartan -hydrochlorothiazide  (DIOVAN  HCT) 160-12.5 MG tablet Take 1 tablet by mouth daily. 09/24/24   Hope Almarie ORN, NP    Physical Exam: BP 110/60 (BP Location: Left Arm)   Pulse 60   Temp 98.2 F (36.8 C) (Oral)   Resp 18   Ht 6' 1 (1.854 m)   Wt (!) 142.9 kg   SpO2 95%   BMI 41.56 kg/m   General: 68 y.o. year-old male well developed well nourished in no acute distress.  Alert and oriented x3. Cardiovascular: Regular rate and rhythm with no rubs or gallops.  No thyromegaly or JVD noted.  Trace edema in lower extremities bilaterally. 2/4 pulses in all 4 extremities. Respiratory: Clear to auscultation with no wheezes or rales. Good inspiratory effort. Abdomen: Soft nontender nondistended with normal bowel sounds x4 quadrants. Muskuloskeletal: No cyanosis or clubbing noted  bilaterally Neuro: CN II-XII intact, strength, sensation, reflexes Skin: No ulcerative lesions noted or rashes Psychiatry: Judgement and insight appear normal. Mood is appropriate for condition and setting          Labs on Admission:  Basic Metabolic Panel: Recent Labs  Lab 10/08/24 1740  NA 139  K 4.0  CL 100  CO2 25  GLUCOSE 109*  BUN 43*  CREATININE 2.34*  CALCIUM 8.7*   Liver Function Tests: Recent Labs  Lab 10/08/24 1740  AST 26  ALT 34  ALKPHOS 53  BILITOT 0.9  PROT 6.8  ALBUMIN  4.0   No results for input(s): LIPASE, AMYLASE in the last 168 hours. No results for input(s): AMMONIA in the last 168 hours. CBC: Recent Labs  Lab 10/08/24 1740  WBC 9.0  NEUTROABS 5.7  HGB 16.1  HCT 49.1  MCV 90.8  PLT 192   Cardiac Enzymes: No results for input(s): CKTOTAL, CKMB, CKMBINDEX, TROPONINI in the last 168 hours.  BNP (last 3 results) Recent Labs    12/27/23 2027  BNP 29.0    ProBNP (last 3 results) No results for input(s): PROBNP in the last 8760 hours.  CBG: No results for input(s): GLUCAP in the last 168 hours.  Radiological Exams on Admission: No results found.  EKG: I independently viewed the EKG done and my findings are as followed: None available at the time of this visit.  Assessment/Plan Present on Admission:  Pain of right hip  Principal Problem:   Pain of right hip  Acute on chronic right hip pain, POA Right hip pain for more than 3 weeks and worsening Follow-up right hip MRI and MRI lumbar spine. Continue pain control as needed with fall precautions. May need official orthopedic surgery consultation if MRI of the right hip is abnormal. May need official neurosurgery consultation if MRI of the lumbar spine is abnormal. PT OT evaluation Fall precautions.  AKI on CKD 3A, secondary to dehydration from poor oral intake Baseline creatinine is 1.3 with GFR of 58. Presented with BUN 43, creatinine of 2.34 with GFR of  30 IV fluid hydration LR at 60 cc/h x 2 days, judiciously to prevent volume overload and respiratory compromise. Monitor urine output with strict I's and O's and daily weight. Avoid nephrotoxic agents, dehydration, and hypotension  Hypertension, BP is stable. Home valsartan /HCTZ on hold due to AKI Closely monitor vital signs.  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  GERD Resume home PPI.  COPD/asthma Resume home regimen.  Severe morbid obesity BMI 41 Recommend weight loss outpatient with regular physical activity and healthy dieting.  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 Consult medical oncology in the morning.  Incidental findings on CT scan from 09/28/24: 1. 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 Recommend nonemergent referral to oncology for further management. 2. Nonobstructing right renal calculi.    Time: 75 minutes.    DVT prophylaxis: Subcu Lovenox  daily.  Code Status: Full code.  Family Communication: None at bedside.  Disposition Plan: Admitted to MedSurg unit.  Consults called: None.  Consult medical oncology in the morning for incidental finding noted above.  Admission status: Inpatient status.   Status is: Inpatient status. The patient requires at least 2 midnights for further evaluation and treatment of present condition.    Terry LOISE Hurst MD [facility] Pager [phone]  If 7PM-7AM, please contact night-coverage [email or web address] Password [email or web address]  10/08/2024, 8:06 PM

## 2024-10-08 NOTE — ED Triage Notes (Signed)
 Pt c/o right abd pain x 3 weeks and right leg pain x 3 weeks. Diarrhea earlier.

## 2024-10-09 ENCOUNTER — Telehealth (HOSPITAL_COMMUNITY): Payer: Self-pay | Admitting: Pharmacy Technician

## 2024-10-09 ENCOUNTER — Ambulatory Visit: Payer: Self-pay | Admitting: Orthopaedic Surgery

## 2024-10-09 ENCOUNTER — Telehealth: Payer: Self-pay

## 2024-10-09 ENCOUNTER — Other Ambulatory Visit (HOSPITAL_COMMUNITY): Payer: Self-pay

## 2024-10-09 DIAGNOSIS — J4489 Other specified chronic obstructive pulmonary disease: Secondary | ICD-10-CM | POA: Diagnosis not present

## 2024-10-09 DIAGNOSIS — Z86718 Personal history of other venous thrombosis and embolism: Secondary | ICD-10-CM | POA: Diagnosis not present

## 2024-10-09 DIAGNOSIS — E66813 Obesity, class 3: Secondary | ICD-10-CM

## 2024-10-09 DIAGNOSIS — E785 Hyperlipidemia, unspecified: Secondary | ICD-10-CM

## 2024-10-09 DIAGNOSIS — E782 Mixed hyperlipidemia: Secondary | ICD-10-CM

## 2024-10-09 DIAGNOSIS — M25551 Pain in right hip: Secondary | ICD-10-CM | POA: Diagnosis not present

## 2024-10-09 DIAGNOSIS — R5381 Other malaise: Secondary | ICD-10-CM

## 2024-10-09 DIAGNOSIS — M549 Dorsalgia, unspecified: Secondary | ICD-10-CM

## 2024-10-09 DIAGNOSIS — M5430 Sciatica, unspecified side: Secondary | ICD-10-CM

## 2024-10-09 DIAGNOSIS — K219 Gastro-esophageal reflux disease without esophagitis: Secondary | ICD-10-CM

## 2024-10-09 DIAGNOSIS — I10 Essential (primary) hypertension: Secondary | ICD-10-CM

## 2024-10-09 DIAGNOSIS — N1831 Chronic kidney disease, stage 3a: Secondary | ICD-10-CM

## 2024-10-09 LAB — CBC
HCT: 44.1 % (ref 39.0–52.0)
Hemoglobin: 14 g/dL (ref 13.0–17.0)
MCH: 29.4 pg (ref 26.0–34.0)
MCHC: 31.7 g/dL (ref 30.0–36.0)
MCV: 92.5 fL (ref 80.0–100.0)
Platelets: 162 K/uL (ref 150–400)
RBC: 4.77 MIL/uL (ref 4.22–5.81)
RDW: 14.3 % (ref 11.5–15.5)
WBC: 9.3 K/uL (ref 4.0–10.5)
nRBC: 0 % (ref 0.0–0.2)

## 2024-10-09 LAB — BASIC METABOLIC PANEL WITH GFR
Anion gap: 11 (ref 5–15)
BUN: 47 mg/dL — ABNORMAL HIGH (ref 8–23)
CO2: 25 mmol/L (ref 22–32)
Calcium: 8.3 mg/dL — ABNORMAL LOW (ref 8.9–10.3)
Chloride: 103 mmol/L (ref 98–111)
Creatinine, Ser: 2.53 mg/dL — ABNORMAL HIGH (ref 0.61–1.24)
GFR, Estimated: 27 mL/min — ABNORMAL LOW (ref >60)
Glucose, Bld: 123 mg/dL — ABNORMAL HIGH (ref 70–99)
Potassium: 4 mmol/L (ref 3.5–5.1)
Sodium: 139 mmol/L (ref 135–145)

## 2024-10-09 LAB — PHOSPHORUS: Phosphorus: 6.7 mg/dL — ABNORMAL HIGH (ref 2.5–4.6)

## 2024-10-09 LAB — MAGNESIUM: Magnesium: 2.6 mg/dL — ABNORMAL HIGH (ref 1.7–2.4)

## 2024-10-09 MED ORDER — METHOCARBAMOL 500 MG PO TABS: 750.0000 mg | ORAL_TABLET | Freq: Three times a day (TID) | ORAL | 0 refills | Status: DC | PRN

## 2024-10-09 MED ORDER — LOSARTAN POTASSIUM 25 MG PO TABS: 25.0000 mg | ORAL_TABLET | Freq: Every day | ORAL | 3 refills | Status: DC

## 2024-10-09 MED ORDER — ACETAMINOPHEN 500 MG PO TABS: 1000.0000 mg | ORAL_TABLET | Freq: Three times a day (TID) | ORAL | Status: AC

## 2024-10-09 MED ORDER — GABAPENTIN 300 MG PO CAPS: 300.0000 mg | ORAL_CAPSULE | Freq: Three times a day (TID) | ORAL | 1 refills | Status: DC

## 2024-10-09 MED ORDER — OXYCODONE HCL 5 MG PO TABS: 5.0000 mg | ORAL_TABLET | Freq: Four times a day (QID) | ORAL | 0 refills | Status: DC | PRN

## 2024-10-09 MED ORDER — POLYETHYLENE GLYCOL 3350 17 G PO PACK: 17.0000 g | PACK | Freq: Every day | ORAL | 1 refills | Status: AC

## 2024-10-09 MED ORDER — APIXABAN 5 MG PO TABS: 5.0000 mg | ORAL_TABLET | Freq: Two times a day (BID) | ORAL | 1 refills | Status: AC

## 2024-10-09 NOTE — Assessment & Plan Note (Signed)
-   Resume adjusted antihypertensive agents - Heart healthy/low-sodium diet discussed with patient.

## 2024-10-09 NOTE — Telephone Encounter (Signed)
 Patient is scheduled

## 2024-10-09 NOTE — TOC Progression Note (Signed)
 Transition of Care [facility]) - Progression Note    Patient Details  Name: Aaron Clarke MRN: [id number] Date of Birth: 09-11-1956  [facility] CM/SW Contact  Lucie Lunger, [place] Phone Number: 10/09/2024, 11:45 AM  Clinical Narrative:    CSW updated that PT is recommending HH PT for pt at D/C. CSW spoke with pt and spouse at bedside to review. They would like to wait closer to D/C to make plan for HH vs OP PT. TOC to follow.   Expected Discharge Plan: Home/Self Care Barriers to Discharge: Continued Medical Work up               Expected Discharge Plan and Services In-house Referral: Clinical Social Work     Living arrangements for the past 2 months: Single Family Home                                       Social Drivers of Health (SDOH) Interventions SDOH Screenings   Food Insecurity: No Food Insecurity (10/08/2024)  Housing: Low Risk  (10/09/2024)  Transportation Needs: No Transportation Needs (10/08/2024)  Utilities: Not At Risk (10/08/2024)  Alcohol Screen: Low Risk  (01/29/2024)  Depression (PHQ2-9): Low Risk  (09/25/2024)  Financial Resource Strain: Low Risk  (01/29/2024)  Physical Activity: Inactive (01/29/2024)  Social Connections: Moderately Integrated (10/08/2024)  Stress: No Stress Concern Present (01/29/2024)  Tobacco Use: Medium Risk (10/08/2024)  Health Literacy: Adequate Health Literacy (01/29/2024)    Readmission Risk Interventions    10/09/2024    7:59 AM 03/13/2024    7:48 PM  Readmission Risk Prevention Plan  Transportation Screening Complete Complete  PCP or Specialist Appt within 3-5 Days  Complete  HRI or Home Care Consult Complete Complete  Social Work Consult for Recovery Care Planning/Counseling Complete Complete  Palliative Care Screening Not Applicable Not Applicable  Medication Review Oceanographer) Complete

## 2024-10-09 NOTE — TOC Transition Note (Signed)
 Transition of Care [facility]) - Discharge Note   Patient Details  Name: [name] MRN: [id number] Date of Birth: [birth year]  Transition of Care [facility]) CM/SW Contact:  Lucie Lunger, LCSWA Phone Number: 10/09/2024, 3:26 PM   Clinical Narrative:    CSW spoke to Aaron Clarke about Aaron Clarke recommendation for [facility] Aaron Clarke, Aaron Clarke is agreeable to this being arranged and does not have an agency preference. CSW sent [facility] referral out, [facility] accepts. CSW spoke to Cory with [facility] to update that MD placed orders and Aaron Clarke will D/C today. CSW spoke with Aaron Clarke about need for walker and bedside commode, Aaron Clarke confirms. MD placed orders for bariatric DME. CSW spoke to Zach who confirms walker will deliver to pts hospital room prior to D/C and [facility] will deliver to the pts home. TOC signing off.   Final next level of care: Home w Home Health Services Barriers to Discharge: Barriers Resolved   Patient Goals and CMS Choice Patient states their goals for this hospitalization and ongoing recovery are:: return home CMS Medicare.gov Compare Post Acute Care list provided to:: Patient Choice offered to / list presented to : Patient [place] ownership interest in [facility].provided to::  (n/a)    Discharge Placement                       Discharge Plan and Services Additional resources added to the After Visit Summary for   In-house Referral: Clinical Social Work              DME Arranged: Bedside commode, Walker rolling DME Agency: [employer] Date DME Agency Contacted: 10/09/24   Representative spoke with at DME Agency: [facility] HH Arranged: Aaron Clarke HH Agency: [facility] Date [facility] Agency Contacted: 10/09/24   Representative spoke with at [facility] Agency: Darleene  Social Drivers of Health (SDOH) Interventions SDOH Screenings   Food Insecurity: No Food Insecurity (10/08/2024)  Housing: Low Risk  (10/09/2024)  Transportation Needs: No Transportation Needs (10/08/2024)  Utilities: Not At Risk (10/08/2024)   Alcohol Screen: Low Risk  (01/29/2024)  Depression (PHQ2-9): Low Risk  (09/25/2024)  Financial Resource Strain: Low Risk  (01/29/2024)  Physical Activity: Inactive (01/29/2024)  Social Connections: Moderately Integrated (10/08/2024)  Stress: No Stress Concern Present (01/29/2024)  Tobacco Use: Medium Risk (10/08/2024)  Health Literacy: Adequate Health Literacy (01/29/2024)     Readmission Risk Interventions    10/09/2024    7:59 AM 03/13/2024    7:48 PM  Readmission Risk Prevention Plan  Transportation Screening Complete Complete  PCP or Specialist Appt within 3-5 Days  Complete  HRI or Home Care Consult Complete Complete  Social Work Consult for Recovery Care Planning/Counseling Complete Complete  Palliative Care Screening Not Applicable Not Applicable  Medication Review Oceanographer) Complete

## 2024-10-09 NOTE — Assessment & Plan Note (Signed)
-   Patient has been seen by physical therapy with recommendation for home health PT at discharge - Rollator DME has been recommended/arranged at discharge.

## 2024-10-09 NOTE — Assessment & Plan Note (Signed)
 Continue home Eliquis

## 2024-10-09 NOTE — Assessment & Plan Note (Signed)
 Continue PPI.

## 2024-10-09 NOTE — Assessment & Plan Note (Signed)
-   Continue statin - Heart healthy/low-fat diet discussed with patient.

## 2024-10-09 NOTE — Telephone Encounter (Signed)
 Patient Product/process Development Scientist completed.    The patient is insured through [facility]. Patient has Medicare and is not eligible for a copay card, but may be able to apply for patient assistance or Medicare RX Payment Plan (Patient Must reach out to their plan, if eligible for payment plan), if available.    Ran test claim for Breo Ellipta   and the current 30 day co-pay is $0.00.  Ran test claim for Eliquis  5 mg  and was filled on 10/09/2024 can be filled again on 11/01/2024   This test claim was processed through [facility]- copay amounts may vary at other pharmacies due to pharmacy/plan contracts, or as the patient moves through the different stages of their insurance plan.     Reyes Sharps, CPHT Pharmacy Technician Patient Advocate Specialist Lead [facility] Pharmacy Patient Advocate Team Direct Number: [phone]  Fax: [phone]

## 2024-10-09 NOTE — TOC Initial Note (Signed)
 Transition of Care [facility]) - Initial/Assessment Note    Patient Details  Name: Aaron Clarke MRN: [id number] Date of Birth: 1956/10/31  Transition of Care [facility]) CM/SW Contact:    Mcarthur Saddie Kim, LCSW Phone Number: 10/09/2024, 8:05 AM  Clinical Narrative:  Pt admitted with acute on chronic right hip pain. Assessment completed due to high risk readmission score. Pt lives with fiance. He has been independent with ADLs until a couple weeks ago when he started having a lot of hip pain. Pt's fiance reports he has had one fall at home recently. PT evaluation pending. TOC will follow for d/c planning needs.                  Expected Discharge Plan: Home/Self Care Barriers to Discharge: Continued Medical Work up   Patient Goals and CMS Choice Patient states their goals for this hospitalization and ongoing recovery are:: return home     [place] ownership interest in [facility].provided to::  (n/a)    Expected Discharge Plan and Services In-house Referral: Clinical Social Work     Living arrangements for the past 2 months: Single Family Home                                      Prior Living Arrangements/Services Living arrangements for the past 2 months: Single Family Home Lives with:: Significant Other Patient language and need for interpreter reviewed:: Yes Do you feel safe going back to the place where you live?: Yes      Need for Family Participation in Patient Care: No (Comment)   Current home services: DME (walker) Criminal Activity/Legal Involvement Pertinent to Current Situation/Hospitalization: No - Comment as needed  Activities of Daily Living   ADL Screening (condition at time of admission) Independently performs ADLs?: No Does the patient have a NEW difficulty with bathing/dressing/toileting/self-feeding that is expected to last >3 days?: No Does the patient have a NEW difficulty with getting in/out of bed, walking, or climbing  stairs that is expected to last >3 days?: No Does the patient have a NEW difficulty with communication that is expected to last >3 days?: No Is the patient deaf or have difficulty hearing?: No Does the patient have difficulty seeing, even when wearing glasses/contacts?: No Does the patient have difficulty concentrating, remembering, or making decisions?: No  Permission Sought/Granted                  Emotional Assessment         Alcohol / Substance Use: Not Applicable Psych Involvement: No (comment)  Admission diagnosis:  AKI (acute kidney injury) [N17.9] Pain of right hip [M25.551] Pain in right leg [M79.604] Patient Active Problem List   Diagnosis Date Noted   Pain of right hip 10/08/2024   Nocturnal hypoxia 09/28/2024   Nephrolithiasis 09/25/2024   Acute right-sided low back pain with right-sided sciatica 09/25/2024   Stage 3a chronic kidney disease (HCC) 05/08/2024   History of pulmonary embolism 03/31/2024   Class 2 obesity 03/12/2024   COPD (chronic obstructive pulmonary disease) (HCC) 03/12/2024   GERD (gastroesophageal reflux disease) 03/12/2024   Asthmatic bronchitis , chronic (HCC) 01/02/2024   Physical exam, annual 12/17/2023   Morbid obesity (HCC) 12/17/2023   BMI 38.0-38.9,adult 12/17/2023   Pain in joint of right hip 11/07/2022   Dyspnea on exertion 10/24/2022   Elevated troponin 10/24/2022   Essential hypertension 10/18/2022   History  of colonic polyps    Family history of colon cancer in father    Polyp of ascending colon    Cecal polyp    Knee arthropathy 09/04/2022   Screening for colon cancer 08/07/2022   Lumbago of lumbar region with sciatica 08/07/2022   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 [place] [address] Phone: [phone] Fax: [phone]  [facility] [place], [place] [phone] [place] [address] Phone: [phone] Fax: [phone]  [facility] [place], [place] - [address] [place] [address] Phone: [phone] Fax: [phone]     Social Drivers of Health (SDOH) Social History: SDOH Screenings   Food Insecurity: No Food Insecurity (10/08/2024)  Housing: Unknown (10/08/2024)  Transportation Needs: No Transportation Needs (10/08/2024)  Utilities: Not At Risk (10/08/2024)  Alcohol Screen: Low Risk  (01/29/2024)  Depression (PHQ2-9): Low Risk  (09/25/2024)  Financial Resource Strain: Low Risk  (01/29/2024)  Physical Activity: Inactive (01/29/2024)  Social Connections: Moderately Integrated (10/08/2024)  Stress: No Stress Concern Present (01/29/2024)  Tobacco Use: Medium Risk (10/08/2024)  Health Literacy: Adequate Health Literacy (01/29/2024)   SDOH Interventions:     Readmission Risk Interventions    10/09/2024    7:59 AM 03/13/2024    7:48 PM  Readmission Risk Prevention Plan  Transportation Screening Complete Complete  PCP or Specialist Appt within 3-5 Days  Complete  HRI or Home Care Consult Complete Complete  Social Work Consult for Recovery Care Planning/Counseling Complete Complete  Palliative Care Screening Not Applicable Not Applicable  Medication Review Oceanographer) Complete

## 2024-10-09 NOTE — Evaluation (Signed)
 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  He had a fall 1 week ago while trying to get into his truck.  He owns and runs a dealer.  Denies any trauma or twisting to his back or hips.  The pain has kept him up at night as he can't find a comfortable position in bed.  He presents to the ER to get a solution to his pain so he can return to working as usual.   Clinical Impression  Patient demonstrates good return for ambulating in room/hallway without loss of balance with minor c/o increased pain RLE, no radiating symptoms down RLE during sensation, MMT testing and tolerated sitting up in chair after therapy. Patient encouraged to pace self at home and sit when pain starts to increase. Patent will benefit from use of Rollator walker to allow him to rest at home once fatigue or increasing RLE pain. Patient will benefit from continued skilled physical therapy in hospital and recommended venue below to increase strength, balance, endurance for safe ADLs and gait.      If plan is discharge home, recommend the following: A little help with walking and/or transfers;A little help with bathing/dressing/bathroom;Help with stairs or ramp for entrance;Assist for transportation;Assistance with cooking/housework   Can travel by Nurse, Mental Health (4 wheels);Other (comment) (Heavy Duty Rollator (4  wheels))  Recommendations for Other Services       Functional Status Assessment Patient has had a recent decline in their functional status and demonstrates the ability to make significant improvements in function in a reasonable and predictable amount of time.     Precautions / Restrictions Precautions Precautions: Fall Recall of Precautions/Restrictions: Intact Restrictions Weight Bearing Restrictions Per Provider Order: No      Mobility  Bed Mobility Overal bed mobility: Modified Independent             General bed mobility comments: HOB elevated    Transfers Overall transfer level: Needs assistance   Transfers: Sit to/from Stand, Bed to chair/wheelchair/BSC Sit to Stand: Contact guard assist   Step pivot transfers: Contact guard assist       General transfer comment: has to lean on armrest of chair during transfers without AD, safer using RW    Ambulation/Gait Ambulation/Gait assistance: Contact guard assist Gait Distance (Feet): 100 Feet Assistive device: Rolling walker (2 wheels) Gait Pattern/deviations: Decreased step length - right, Decreased step length - left, Decreased stride length, Antalgic, Trunk flexed Gait velocity: decreased     General Gait Details: slightly labored movement with good return for ambulating in room, hallway without loss of balance with minor increase in right hip pain  Stairs            Wheelchair Mobility     Tilt Bed    Modified Rankin (Stroke Patients Only)       Balance Overall balance assessment: Needs assistance Sitting-balance support: Feet supported, No upper extremity supported Sitting balance-Leahy Scale: Good Sitting balance -  Comments: seated at EOB   Standing balance support: No upper extremity supported, During functional activity Standing balance-Leahy Scale: Poor Standing balance comment: fair/good using RW                             Pertinent Vitals/Pain Pain Assessment Pain  Assessment: 0-10 Pain Score: 3  Pain Location: R hip, stomach, groin, and above R ankle. Pain Descriptors / Indicators: Cramping Pain Intervention(s): Limited activity within patient's tolerance, Monitored during session, Repositioned    Home Living Family/patient expects to be discharged to:: Private residence Living Arrangements: Spouse/significant other Available Help at Discharge: Family;Available 24 hours/day Type of Home: House Home Access: Stairs to enter Entrance Stairs-Rails: Can reach both Entrance Stairs-Number of Steps: 6   Home Layout: One level Home Equipment: Cane - single Librarian, Academic (2 wheels)      Prior Function Prior Level of Function : Working/employed;Driving;Needs assist;History of Falls (last six months)       Physical Assist : ADLs (physical);Mobility (physical) Mobility (physical): Bed mobility;Transfers;Gait;Stairs ADLs (physical): Dressing Mobility Comments: Community ambulation without AD, drives. Has needed RW the past 3 weeks. ADLs Comments: Baseline assist for donning socks; independent otherwise.     Extremity/Trunk Assessment   Upper Extremity Assessment Upper Extremity Assessment: Defer to OT evaluation    Lower Extremity Assessment Lower Extremity Assessment: Generalized weakness;RLE deficits/detail RLE Deficits / Details: grossly 4/5 RLE Sensation: WNL RLE Coordination: WNL    Cervical / Trunk Assessment Cervical / Trunk Assessment: Normal  Communication   Communication Communication: No apparent difficulties    Cognition Arousal: Alert Behavior During Therapy: WFL for tasks assessed/performed   PT - Cognitive impairments: No apparent impairments                         Following commands: Intact       Cueing Cueing Techniques: Verbal cues     General Comments      Exercises     Assessment/Plan    PT Assessment Patient needs continued PT services  PT Problem List Decreased strength;Decreased  activity tolerance;Decreased balance;Decreased mobility;Pain       PT Treatment Interventions DME instruction;Gait training;Stair training;Functional mobility training;Therapeutic activities;Therapeutic exercise;Balance training;Patient/family education    PT Goals (Current goals can be found in the Care Plan section)  Acute Rehab PT Goals Patient Stated Goal: return home with family to assist PT Goal Formulation: With patient/family Time For Goal Achievement: 10/13/24 Potential to Achieve Goals: Good    Frequency Min 3X/week     Co-evaluation PT/OT/SLP Co-Evaluation/Treatment: Yes Reason for Co-Treatment: To address functional/ADL transfers PT goals addressed during session: Mobility/safety with mobility;Balance;Proper use of DME OT goals addressed during session: ADL's and self-care       AM-PAC PT 6 Clicks Mobility  Outcome Measure Help needed turning from your back to your side while in a flat bed without using bedrails?: None Help needed moving from lying on your back to sitting on the side of a flat bed without using bedrails?: None Help needed moving to and from a bed to a chair (including a wheelchair)?: A Little Help needed standing up from a chair using your arms (e.g., wheelchair or bedside chair)?: A Little Help needed to walk in hospital room?: A Little Help needed climbing 3-5 steps with a railing? : A Little 6 Click Score: 20    End of Session Equipment Utilized During Treatment: Gait belt Activity Tolerance: Patient  tolerated treatment well;Patient limited by fatigue Patient left: in chair;with call bell/phone within reach;with family/visitor present Nurse Communication: Mobility status PT Visit Diagnosis: Unsteadiness on feet (R26.81);Other abnormalities of gait and mobility (R26.89);Muscle weakness (generalized) (M62.81)    Time: 9145-9087 PT Time Calculation (min) (ACUTE ONLY): 18 min   Charges:   PT Evaluation $PT Eval Low Complexity: 1 Low PT  Treatments $Therapeutic Activity: 8-22 mins PT General Charges $$ ACUTE PT VISIT: 1 Visit         12:02 PM, 10/09/24 Lynwood Music, MPT Physical Therapist with [facility] [phone] office [phone] mobile phone

## 2024-10-09 NOTE — Telephone Encounter (Signed)
 Dr Clois has offered to see him at 1:30pm on Monday 10/13/24.

## 2024-10-09 NOTE — ED Provider Notes (Signed)
 [facility] MEDICAL SURGICAL UNIT Provider Note   CSN: [id number] Arrival date & time: 10/08/24  1621     Patient presents with: Abdominal Pain   Case Aaron Clarke is a 68 y.o. male.   Patient complains of pain in his right groin area.  He was supposed to get an MRI which has been scheduled by the orthopedic office.  Patient having worsening pain.  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  The history is provided by the patient and medical records. No language interpreter was used.  Abdominal Pain Pain location:  RLQ Pain quality: aching   Pain radiates to:  Does not radiate Pain severity:  Moderate Onset quality:  Sudden Timing:  Constant Progression:  Worsening Chronicity:  New Relieved by:  Nothing Worsened by:  Nothing Associated symptoms: no chest pain, no cough, no diarrhea, no fatigue and no hematuria        Prior to Admission medications   Medication Sig Start Date End Date Taking? Authorizing Provider  acetaminophen  (TYLENOL ) 500 MG tablet Take 2 tablets (1,000 mg total) by mouth every 8 (eight) hours. 10/09/24 10/09/25 Yes Ricky Fines, MD  gabapentin  (NEURONTIN ) 300 MG capsule Take 1 capsule (300 mg total) by mouth 3 (three) times daily. 10/09/24 10/09/25 Yes Ricky Fines, MD  losartan  (COZAAR ) 25 MG tablet Take 1 tablet (25 mg total) by mouth daily. 10/09/24 10/09/25 Yes Ricky Fines, MD  albuterol  (PROVENTIL ) (2.5 MG/3ML) 0.083% nebulizer solution Take 3 mLs (2.5 mg total) by nebulization every 6 (six) hours as needed for wheezing or shortness of breath. 12/27/23   Patsey Lot, MD  albuterol  (VENTOLIN  HFA) [phone] Base) MCG/ACT inhaler Inhale 1-2 puffs into the lungs every 6 (six) hours as needed for wheezing or shortness of breath. 08/20/22   Billy Asberry FALCON, PA-C  apixaban  (ELIQUIS ) 5 MG TABS tablet Take 1 tablet (5 mg total) by mouth 2 (two) times daily. 10/09/24   Ricky Fines, MD  bisoprolol  (ZEBETA ) 5 MG tablet TAKE 1  TABLET BY MOUTH DAILY 09/09/24   Wert, Michael B, MD  budesonide -formoterol  (SYMBICORT ) 160-4.5 MCG/ACT inhaler USE 2 INHALATIONS BY MOUTH FIRST THING IN THE MORNING AND THEN  ANOTHER 2 INHALATIONS AFTER  ABOUT 12 HOURS LATER 09/09/24   Darlean Ozell NOVAK, MD  lidocaine  (LIDODERM ) 5 % Place 1 patch onto the skin daily.    [provider]  methocarbamol  (ROBAXIN ) 500 MG tablet Take 1.5 tablets (750 mg total) by mouth every 8 (eight) hours as needed for muscle spasms. 10/09/24   Ricky Fines, MD  oxyCODONE  (OXY IR/ROXICODONE ) 5 MG immediate release tablet Take 1-2 tablets (5-10 mg total) by mouth every 6 (six) hours as needed for severe pain (pain score 7-10). 10/09/24   Ricky Fines, MD  pantoprazole  (PROTONIX ) 40 MG tablet Take 40 mg by mouth daily before breakfast.    [provider]  polyethylene glycol (MIRALAX  / GLYCOLAX ) 17 g packet Take 17 g by mouth daily. 10/09/24   Ricky Fines, MD  pravastatin (PRAVACHOL) 10 MG tablet Take 10 mg by mouth daily. 11/29/13   [provider]  predniSONE  (DELTASONE ) 20 MG tablet 3 po once a day for 2 days, then 2 po once a day for 3 days, then 1 po once a day for 3 days 10/02/24   Steinl, Kevin, MD  rosuvastatin (CRESTOR) 10 MG tablet Take 1 tablet (10 mg total) by mouth daily. 08/22/24   Rana Lum CROME, NP  traMADol (ULTRAM) 50 MG  tablet Take 1 tablet (50 mg total) by mouth every 6 (six) hours as needed. 10/01/24   Bernard Drivers, MD  valsartan -hydrochlorothiazide  (DIOVAN  HCT) 160-12.5 MG tablet Take 1 tablet by mouth daily. 09/24/24   Hope Almarie ORN, NP    Allergies: Patient has no known allergies.    Review of Systems  Constitutional:  Negative for appetite change and fatigue.  HENT:  Negative for congestion, ear discharge and sinus pressure.   Eyes:  Negative for discharge.  Respiratory:  Negative for cough.   Cardiovascular:  Negative for chest pain.  Gastrointestinal:  Positive for abdominal pain. Negative for  diarrhea.  Genitourinary:  Negative for frequency and hematuria.  Musculoskeletal:  Negative for back pain.  Skin:  Negative for rash.  Neurological:  Negative for seizures and headaches.  Psychiatric/Behavioral:  Negative for hallucinations.     Updated Vital Signs BP (!) 111/57   Pulse 64   Temp 98.3 F (36.8 C) (Oral)   Resp 20   Ht 6' 1 (1.854 m)   Wt (!) 142.9 kg   SpO2 95%   BMI 41.56 kg/m   Physical Exam Vitals reviewed.  Constitutional:      Appearance: Normal appearance. He is well-developed.  HENT:     Head: Normocephalic.     Mouth/Throat:     Mouth: Mucous membranes are moist.  Eyes:     General: No scleral icterus.    Conjunctiva/sclera: Conjunctivae normal.  Neck:     Thyroid: No thyromegaly.  Cardiovascular:     Rate and Rhythm: Normal rate and regular rhythm.     Heart sounds: No murmur heard.    No friction rub. No gallop.  Pulmonary:     Breath sounds: No stridor. No wheezing or rales.  Chest:     Chest wall: No tenderness.  Abdominal:     General: There is no distension.     Tenderness: There is no abdominal tenderness. There is no rebound.  Musculoskeletal:        General: Normal range of motion.     Cervical back: Neck supple.  Lymphadenopathy:     Cervical: No cervical adenopathy.  Skin:    Findings: No erythema or rash.  Neurological:     Mental Status: He is alert and oriented to person, place, and time.     Motor: No abnormal muscle tone.     Coordination: Coordination normal.  Psychiatric:        Behavior: Behavior normal.     (all labs ordered are listed, but only abnormal results are displayed) Labs Reviewed  CBC WITH DIFFERENTIAL/PLATELET - Abnormal; Notable for the following components:      Result Value   Abs Immature Granulocytes 0.08 (*)    All other components within normal limits  COMPREHENSIVE METABOLIC PANEL WITH GFR - Abnormal; Notable for the following components:   Glucose, Bld 109 (*)    BUN 43 (*)     Creatinine, Ser 2.34 (*)    Calcium  8.7 (*)    GFR, Estimated 30 (*)    All other components within normal limits  BASIC METABOLIC PANEL WITH GFR - Abnormal; Notable for the following components:   Glucose, Bld 123 (*)    BUN 47 (*)    Creatinine, Ser 2.53 (*)    Calcium  8.3 (*)    GFR, Estimated 27 (*)    All other components within normal limits  MAGNESIUM - Abnormal; Notable for the following components:   Magnesium 2.6 (*)  All other components within normal limits  PHOSPHORUS - Abnormal; Notable for the following components:   Phosphorus 6.7 (*)    All other components within normal limits  CBC    EKG: None  Radiology: MR HIP RIGHT WO CONTRAST Result Date: 10/09/2024 CLINICAL DATA:  Right hip pain. 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 Patient fell a few weeks ago and has persistent right hip and right lower extremity pain. EXAM: MR OF THE RIGHT HIP WITHOUT CONTRAST TECHNIQUE: Multiplanar, multisequence MR imaging was performed. No intravenous contrast was administered. COMPARISON:  Multiple prior imaging studies. Recent CT scan 09/28/2024 FINDINGS: The right hip arthroplasty components appear well seated without complicating features. No findings suspicious for periprosthetic fracture. No dislocation or subluxation. The left hip is normally located. No fracture or AVN. No hip joint effusions. No periarticular fluid collections are identified. The pubic symphysis and SI joints are intact. No pelvic fractures or bone lesions. Inflammations/edema noted in the right gluteal muscles suggesting muscle strains contusions possibly related to following. No intramuscular hematoma. Moderate inflammations/edema/fluid also noted in the subcutaneous likely from related to a contusion. No discrete or large hematoma. The hamstring tendons are intact. The sacral nerve roots surrounded by in the neural foramen. The sciatic nerve complexes normal bilaterally, surrounded by fat. No  mass inflammatory process the visualized course. No significant intrapelvic abnormalities are identified. No inguinal mass or hernia. IMPRESSION: 1. The right hip arthroplasty components appear well seated without complicating features. No findings suspicious for periprosthetic fracture. 2. Inflammations/edema noted in the right gluteal muscles suggesting muscle strains or contusions possibly related to falling. No intramuscular hematoma. 3. Moderate inflammations/edema/fluid also noted in the subcutaneous tissues likely from related to a contusion. No discrete or large hematoma. 4. No significant intrapelvic abnormalities. Electronically Signed   By: MYRTIS Stammer M.D.   On: 10/09/2024 10:48   MR LUMBAR SPINE WO CONTRAST Result Date: 10/09/2024 EXAM: MRI LUMBAR SPINE 10/09/2024 06:03:58 AM TECHNIQUE: Multiplanar multisequence MRI of the lumbar spine was performed without the administration of intravenous contrast. COMPARISON: Lumbar radiographs 10/01/2024. CT abdomen and pelvis 09/28/2024. CTA chest 05/25/2024. CLINICAL HISTORY: 68 year old male with low back pain, persistent right leg pain after a fall, and suspected infection with positive X-ray/CT. 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 Correlation with radiographs is recommended prior to any operative intervention. L5 superior endplate deformity with faint marrow edema on series 10 image 13 appears to be Schmorl node or mild compression which has not significantly changed from CT abdomen and pelvis in October. Normal background bone marrow signal. No other marrow edema. Intact visible sacrum and SI joints. Benign L1 vertebral body hemangioma. SPINAL CORD: Normal conus medullaris at L1-L2. SOFT TISSUES: Visible abdominal viscera are stable from the recent CT abdomen and pelvis, including asymmetric right renal atrophy.  Negative lumbar paraspinal soft tissues. DEGENERATIVE: Lower thoracic through L4 interbody ankylosis due to hyperostosis. L4-L5: Bulky circumferential disc bulge with endplate spurring asymmetric to the left and mostly affecting the left neural foramen. Superimposed mild to moderate posterior element hypertrophy. No spinal or convincing lateral recess stenosis. Moderate to severe left L4 neural foraminal stenosis (series 9 image 13). L5-S1: Disc desiccation and disc space loss with bulky paracentral disc protrusion or extrusion (series 9 image 9 and series 12 image 30). Underlying circumferential disc bulge with endplate spurring, moderate posterior element hypertrophy,  and some epidural lipomatosis. Severe spinal and bilateral lateral recess stenosis (descending S1 nerve levels, series 12 image 30). Moderate bilateral L5 neural foraminal stenosis. IMPRESSION: 1. 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 2. Hyperostosis with sub-total spinal interbody ankylosis; absent in the lumbar spine only at L4L5 and L5S1. 3. L5S1 bulky disc herniation contributes in large part to severe spinal and bilateral lateral recess stenosis. Query S1 radiculitis. Moderate bilateral L5 neural foraminal stenosis. 4. L4L5 bulky disc and endplate degeneration asymmetric to the left with moderate to severe left L4 foraminal stenosis. Electronically signed by: Helayne Hurst MD 10/09/2024 06:24 AM EST RP Workstation: [id number]     Procedures   Medications Ordered in the ED  acetaminophen  (TYLENOL ) tablet 500 mg (has no administration in time range)  prochlorperazine  (COMPAZINE ) injection 5 mg (has no administration in time range)  melatonin tablet 3 mg (has no administration in time range)  polyethylene glycol (MIRALAX  / GLYCOLAX ) packet 17 g (has no administration in time range)  fluticasone furoate-vilanterol (BREO ELLIPTA)  100-25 MCG/ACT 1 puff (1 puff Inhalation Given 10/09/24 0715)  pantoprazole  (PROTONIX ) EC tablet 40 mg (40 mg Oral Given 10/09/24 0840)  rosuvastatin (CRESTOR) tablet 10 mg (10 mg Oral Given 10/09/24 0840)  lactated ringers  infusion ( Intravenous Rate/Dose Change 10/09/24 0839)  apixaban  (ELIQUIS ) tablet 5 mg (5 mg Oral Given 10/09/24 0840)  oxyCODONE  (Oxy IR/ROXICODONE ) immediate release tablet 5-10 mg (5 mg Oral Given 10/09/24 0348)  HYDROmorphone  (DILAUDID ) injection 0.5 mg (0.5 mg Intravenous Given 10/09/24 1040)  sodium chloride  0.9 % bolus 1,000 mL (0 mLs Intravenous Stopped 10/08/24 1814)  sodium chloride  0.9 % bolus 1,000 mL (1,000 mLs Intravenous New Bag/Given 10/08/24 1919)                                    Medical Decision Making Amount and/or Complexity of Data Reviewed Labs: ordered. Radiology: ordered.  Risk Decision regarding hospitalization.   Patient with pancreatic mass and AKI.  He will be admitted to medicine     Final diagnoses:  AKI (acute kidney injury)    ED Discharge Orders          Ordered    oxyCODONE  (OXY IR/ROXICODONE ) 5 MG immediate release tablet  Every 6 hours PRN        10/09/24 1314    losartan  (COZAAR ) 25 MG tablet  Daily        10/09/24 1314    methocarbamol  (ROBAXIN ) 500 MG tablet  Every 8 hours PRN        10/09/24 1314    polyethylene glycol (MIRALAX  / GLYCOLAX ) 17 g packet  Daily        10/09/24 1314    apixaban  (ELIQUIS ) 5 MG TABS tablet  2 times daily        10/09/24 1314    acetaminophen  (TYLENOL ) 500 MG tablet  Every 8 hours        10/09/24 1314    gabapentin  (NEURONTIN ) 300 MG capsule  3 times daily        10/09/24 1314    Increase activity slowly        10/09/24 1314    Discharge instructions       Comments: Medications are prescribed Follow-up with neurosurgery service as instructed Arrange follow-up with PCP in 10 days Maintain adequate hydration Follow heart healthy/low-sodium and low  calorie diet.   10/09/24  1314               Suzette Pac, MD 10/09/24 1415

## 2024-10-09 NOTE — Care Management Obs Status (Signed)
 MEDICARE OBSERVATION STATUS NOTIFICATION   Patient Details  Name: Aaron Clarke MRN: [id number] Date of Birth: December 01, 1955   Medicare Observation Status Notification Given:  Yes    Lucie Lunger, LCSWA 10/09/2024, 2:36 PM

## 2024-10-09 NOTE — Assessment & Plan Note (Signed)
-   Stage IIIa chronic kidney disease - Continue to maintain adequate hydration - Minimize nephrotoxic agents - Continue to follow renal function trend. - Medications has been adjusted based on renal function.

## 2024-10-09 NOTE — Plan of Care (Signed)
   Problem: Activity: Goal: Risk for activity intolerance will decrease Outcome: Progressing

## 2024-10-09 NOTE — Assessment & Plan Note (Signed)
-   Associated with herniated disc (L5-S1). - Patient has completed treatment with steroids, no significant response - Case discussed with neurosurgery service who recommended pain management and outpatient follow-up to determine further intervention/definitive management. -Patient has been discharged on Neurontin , Tylenol , oxycodone  and Robaxin .

## 2024-10-09 NOTE — Assessment & Plan Note (Signed)
-   Images demonstrating a stable prior hip implants - Continue as needed analgesics - Continue recommendations by PT - No further interventions for his right hip anticipated.

## 2024-10-09 NOTE — Plan of Care (Signed)
   Problem: Education: Goal: Knowledge of General Education information will improve Description Including pain rating scale, medication(s)/side effects and non-pharmacologic comfort measures Outcome: Progressing

## 2024-10-09 NOTE — Plan of Care (Signed)
  Problem: Acute Rehab PT Goals(only PT should resolve) Goal: Pt Will Go Supine/Side To Sit Outcome: Progressing Flowsheets (Taken 10/09/2024 1203) Pt will go Supine/Side to Sit: Independently Goal: Patient Will Transfer Sit To/From Stand Outcome: Progressing Flowsheets (Taken 10/09/2024 1203) Patient will transfer sit to/from stand: with modified independence Goal: Pt Will Transfer Bed To Chair/Chair To Bed Outcome: Progressing Flowsheets (Taken 10/09/2024 1203) Pt will Transfer Bed to Chair/Chair to Bed: with modified independence Goal: Pt Will Ambulate Outcome: Progressing Flowsheets (Taken 10/09/2024 1203) Pt will Ambulate:  > 125 feet  with modified independence  with rolling walker   12:03 PM, 10/09/24 Lynwood Music, MPT Physical Therapist with [facility] 336 [phone] office [phone] mobile phone

## 2024-10-09 NOTE — Care Management CC44 (Signed)
 Condition Code 44 Documentation Completed  Patient Details  Name: Aaron Clarke MRN: [id number] Date of Birth: November 04, 1956   Condition Code 44 given:  Yes Patient signature on Condition Code 44 notice:  Yes Documentation of 2 MD's agreement:  Yes Code 44 added to claim:  Yes    Lucie Lunger, LCSWA 10/09/2024, 2:36 PM

## 2024-10-09 NOTE — Telephone Encounter (Signed)
 I received the following secure chat from Dr Janjua: can you please get this pt in to see the team for severe (lumbar) spinal stenosis? He didn't say anything about profound weakness that would make me think of a cauda equina or emergent/urgent intervention.   The [place] [facility] called Dr Janjua about this patient. OK to schedule next available new patient appointment with any PA. Please place on cancellation list. If Dr Claudene has a sooner opening or if Dr Clois adds on a Thursday afternoon clinic, OK to schedule there as well.

## 2024-10-09 NOTE — Discharge Summary (Signed)
 Physician Discharge Summary   Patient: Aaron Clarke MRN: [id number] DOB: [birth year]  Admit date:     10/08/2024  Discharge date: 10/09/24  Discharge Physician: Eric Nunnery   PCP: Kayla Jeoffrey RAMAN, FNP   Recommendations at discharge:  Assess blood pressure and adjust antihypertensive treatment as needed Repeat basic metabolic panel to follow ultralights and renal function Make sure patient follow-up with neurosurgery service as instructed Continue assisting patient with weight loss management.  Discharge Diagnoses: Principal Problem:   Pain of right hip Morbid obesity Lower back pain/herniated disc with sciatica Chronic kidney disease stage IIIa GERD COPD/asthma Hypertension Hyperlipidemia History of DVT Physical deconditioning  Brief Hospital admission narrative: As per H&P written by Dr. 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  He had a fall 1 week ago while trying to get into his truck.  He owns and runs a dealer.  Denies any trauma or twisting to his back or hips.  The pain has kept him up at night as he can't find a comfortable position in bed.  He presents to the ER to get a solution to his pain so he can return to working as usual.   In the ER, vital signs are stable.  EDP discussed the case with orthopedic surgery who recommended MRI of right hip and lumbar spine which were ordered by EDP.  Lab studies were notable for elevated BUN and creatinine above baseline.  EDP requested admission for management of right hip pain and AKI.   Admitted by [facility], hospitalist service.   ED Course: Temperature 98.2.  BP 111/73, pulse 65, respiration rate 18, O2 saturation 95% on room air. Assessment & Plan Pain of right hip - Images demonstrating a stable prior hip implants - Continue as needed analgesics - Continue recommendations by PT - No further interventions for his right hip anticipated. Obesity, Class III, BMI 40-49.9 (morbid obesity) (HCC) -Body mass index is 41.56 kg/m. - Low-carb diet and portion control discussed with patient. Essential hypertension - Resume adjusted antihypertensive agents - Heart healthy/low-sodium diet discussed with patient. GERD (gastroesophageal reflux disease) - Continue PPI. Back pain with sciatica - Associated with herniated disc (L5-S1). - Patient has completed treatment with steroids, no significant response - Case discussed with neurosurgery service who recommended pain management and outpatient follow-up to determine further intervention/definitive management. -Patient has been discharged on Neurontin , Tylenol , oxycodone  and Robaxin . Hyperlipidemia - Continue statin - Heart healthy/low-fat diet discussed with patient. COPD with asthma (HCC) - Stable and well-controlled - Good saturation on room air appreciated - Continue home bronchodilator management. History of DVT (deep vein thrombosis) - Continue home Eliquis . CKD (chronic kidney disease), stage III (HCC) - Stage IIIa chronic kidney disease - Continue to maintain adequate hydration - Minimize nephrotoxic agents - Continue to follow renal function trend. - Medications has been adjusted based on renal function. Physical deconditioning - Patient has been seen by physical therapy with recommendation for home health PT at discharge - [employer] has been recommended/arranged at discharge.  Consultants: Neurosurgery curbside (Dr. Rosslyn) Procedures performed: See below for x-ray reports. Disposition: Home with home health  services. Diet recommendation: Heart healthy/low calorie and low-sodium diet.  DISCHARGE MEDICATION: Allergies as of 10/09/2024   No Known Allergies      Medication List     STOP taking these medications    pravastatin 10 MG tablet Commonly known as: PRAVACHOL   predniSONE  20 MG tablet Commonly known as: DELTASONE    traMADol  50 MG tablet Commonly known as: ULTRAM    valsartan -hydrochlorothiazide  160-12.5 MG tablet Commonly known as: Diovan  HCT       TAKE these medications    acetaminophen  500 MG tablet Commonly known as: TYLENOL  Take 2 tablets (1,000 mg total) by mouth every 8 (eight) hours.   albuterol  108 (90 Base) MCG/ACT inhaler Commonly known as: VENTOLIN  HFA Inhale 1-2 puffs into the lungs every 6 (six) hours as needed for wheezing or shortness of breath.   albuterol  (2.5 MG/3ML) 0.083% nebulizer solution Commonly known as: PROVENTIL  Take 3 mLs (2.5 mg total) by nebulization every 6 (six) hours as needed for wheezing or shortness of breath.   apixaban  5 MG Tabs tablet Commonly known as: ELIQUIS  Take 1 tablet (5 mg total) by mouth 2 (two) times daily.   bisoprolol  5 MG tablet Commonly known as: ZEBETA  TAKE 1 TABLET BY MOUTH DAILY   budesonide -formoterol  160-4.5 MCG/ACT inhaler Commonly known as: Symbicort  USE 2 INHALATIONS BY MOUTH FIRST THING IN THE MORNING AND THEN  ANOTHER 2 INHALATIONS AFTER  ABOUT 12 HOURS LATER   gabapentin  300 MG capsule Commonly known as: Neurontin  Take 1 capsule (300 mg total) by mouth 3 (three) times daily.   lidocaine  5 % Commonly known as: LIDODERM  Place 1 patch onto the skin daily.   losartan  25 MG tablet Commonly known as: Cozaar  Take 1 tablet (25 mg total) by mouth daily.   methocarbamol  500 MG tablet Commonly known as: ROBAXIN  Take 1.5 tablets (750 mg total) by mouth every 8 (eight) hours as needed for muscle spasms. What changed:  how much to take when to take this   oxyCODONE  5 MG immediate release  tablet Commonly known as: Oxy IR/ROXICODONE  Take 1-2 tablets (5-10 mg total) by mouth every 6 (six) hours as needed for severe pain (pain score 7-10).   pantoprazole  40 MG tablet Commonly known as: PROTONIX  Take 40 mg by mouth daily before breakfast.   polyethylene glycol 17 g packet Commonly known as: MIRALAX  / GLYCOLAX  Take 17 g by mouth daily.   rosuvastatin  10 MG tablet Commonly known as: CRESTOR  Take 1 tablet (10 mg total) by mouth daily.               Durable Medical Equipment  (From admission, onward)           Start     Ordered   10/09/24 1208  For home use only DME 4 wheeled rolling walker with seat  Once       Comments: Patient requires frequent rest breaks during ambulation due to RLE pain and will benefit from use of Heavy Duty 4 wheeled rolling walker with seat  Question:  Patient needs a walker to treat with the following condition  Answer:  Gait difficulty   10/09/24 1208            Discharge Exam: Fredricka Weights   10/08/24 1632  Weight: (!) 142.9 kg   General exam: Alert, awake, oriented x 3; reporting intermittent ongoing lower back and bilateral sciatica pain.  Hemodynamically stable and wanting to go home. Respiratory system: Good saturation on room air Cardiovascular system:RRR. No murmurs, rubs,  gallops.  No JVD. Gastrointestinal system: Abdomen is obese, nondistended, soft and nontender. No organomegaly or masses felt. Normal bowel sounds heard. Central nervous system: Able to move 4 limbs spontaneously.  Patient reporting some down the legs tingling/weakness (intermittent); no focal neurological deficits. Extremities: No cyanosis or clubbing. Skin: No petechiae. Psychiatry: Judgement and insight appear normal. Mood & affect appropriate.    Condition at discharge: Stable and improved.  The results of significant diagnostics from this hospitalization (including imaging, microbiology, ancillary and laboratory) are listed below for  reference.   Imaging Studies: MR HIP RIGHT WO CONTRAST Result Date: 10/09/2024 CLINICAL DATA:  Right hip pain. 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 Patient fell a few weeks ago and has persistent right hip and right lower extremity pain. EXAM: MR OF THE RIGHT HIP WITHOUT CONTRAST TECHNIQUE: Multiplanar, multisequence MR imaging was performed. No intravenous contrast was administered. COMPARISON:  Multiple prior imaging studies. Recent CT scan 09/28/2024 FINDINGS: The right hip arthroplasty components appear well seated without complicating features. No findings suspicious for periprosthetic fracture. No dislocation or subluxation. The left hip is normally located. No fracture or AVN. No hip joint effusions. No periarticular fluid collections are identified. The pubic symphysis and SI joints are intact. No pelvic fractures or bone lesions. Inflammations/edema noted in the right gluteal muscles suggesting muscle strains contusions possibly related to following. No intramuscular hematoma. Moderate inflammations/edema/fluid also noted in the subcutaneous likely from related to a contusion. No discrete or large hematoma. The hamstring tendons are intact. The sacral nerve roots surrounded by in the neural foramen. The sciatic nerve complexes normal bilaterally, surrounded by fat. No mass inflammatory process the visualized course. No significant intrapelvic abnormalities are identified. No inguinal mass or hernia. IMPRESSION: 1. The right hip arthroplasty components appear well seated without complicating features. No findings suspicious for periprosthetic fracture. 2. Inflammations/edema noted in the right gluteal muscles suggesting muscle strains or contusions possibly related to falling. No intramuscular hematoma. 3. Moderate inflammations/edema/fluid also noted in the subcutaneous tissues likely from related to a contusion. No discrete or large hematoma. 4. No significant intrapelvic  abnormalities. Electronically Signed   By: MYRTIS Stammer M.D.   On: 10/09/2024 10:48   MR LUMBAR SPINE WO CONTRAST Result Date: 10/09/2024 EXAM: MRI LUMBAR SPINE 10/09/2024 06:03:58 AM TECHNIQUE: Multiplanar multisequence MRI of the lumbar spine was performed without the administration of intravenous contrast. COMPARISON: Lumbar radiographs 10/01/2024. CT abdomen and pelvis 09/28/2024. CTA chest 05/25/2024. CLINICAL HISTORY: 68 year old male with low back pain, persistent right leg pain after a fall, and suspected infection with positive X-ray/CT. 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 Correlation with radiographs is recommended prior to any operative intervention. L5 superior endplate deformity with faint marrow edema on series 10 image 13 appears to be Schmorl node or mild compression which has not significantly changed from CT abdomen and pelvis in October. Normal background bone marrow signal. No other marrow edema. Intact visible sacrum and SI joints. Benign L1 vertebral body hemangioma. SPINAL CORD: Normal conus medullaris at L1-L2. SOFT TISSUES: Visible abdominal viscera are stable from the recent CT abdomen and pelvis, including asymmetric right renal atrophy. Negative lumbar paraspinal soft tissues. DEGENERATIVE: Lower thoracic through L4 interbody ankylosis due to hyperostosis. L4-L5: Bulky circumferential disc bulge with endplate spurring asymmetric to the left and mostly affecting the left neural foramen. Superimposed mild to  moderate posterior element hypertrophy. No spinal or convincing lateral recess stenosis. Moderate to severe left L4 neural foraminal stenosis (series 9 image 13). L5-S1: Disc desiccation and disc space loss with bulky paracentral disc protrusion or extrusion (series 9 image 9 and series 12 image 30). Underlying circumferential disc bulge  with endplate spurring, moderate posterior element hypertrophy, and some epidural lipomatosis. Severe spinal and bilateral lateral recess stenosis (descending S1 nerve levels, series 12 image 30). Moderate bilateral L5 neural foraminal stenosis. IMPRESSION: 1. 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 2. Hyperostosis with sub-total spinal interbody ankylosis; absent in the lumbar spine only at L4L5 and L5S1. 3. L5S1 bulky disc herniation contributes in large part to severe spinal and bilateral lateral recess stenosis. Query S1 radiculitis. Moderate bilateral L5 neural foraminal stenosis. 4. L4L5 bulky disc and endplate degeneration asymmetric to the left with moderate to severe left L4 foraminal stenosis. Electronically signed by: Helayne Hurst MD 10/09/2024 06:24 AM EST RP Workstation: [id number]   DG Lumbar Spine Complete Result Date: 10/01/2024 EXAM: 4 VIEW(S) XRAY OF THE LUMBAR SPINE 10/01/2024 09:20:00 PM COMPARISON: None available. CLINICAL HISTORY: fall, pain FINDINGS: LUMBAR SPINE: BONES: No acute fracture. No aggressive appearing osseous lesion. Alignment is normal. 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 DISCS AND DEGENERATIVE CHANGES: Multilevel spondylosis, disc space height loss, and degenerative endplate changes. These are most advanced at L2-L3 where there is ankylosis of the vertebral bodies. Moderate multilevel facet arthropathy. SOFT TISSUES: No acute abnormality. IMPRESSION: 1. No evidence of acute fracture. 2. Multilevel age advanced degenerative changes progressed from 56 / 4 / 38 . Electronically signed by: Norman Gatlin MD 10/01/2024 09:41 PM EST RP Workstation: [id number]   DG Tibia/Fibula Right Result Date: 10/01/2024 EXAM: _VIEWS_ VIEW(S) XRAY OF THE _LATERALITY_ TIBIA AND FIBULA 10/01/2024 09:20:00 PM COMPARISON: None available. CLINICAL HISTORY: fall, pain FINDINGS:  BONES AND JOINTS: No acute fracture. No focal osseous lesion. No joint dislocation. Moderate degenerative changes of the Knee. SOFT TISSUES: The soft tissues are unremarkable. IMPRESSION: 1. No acute fracture or dislocation. Electronically signed by: Norman Gatlin MD 10/01/2024 09:38 PM EST RP Workstation: [id number]   DG Femur Min 2 Views Right Result Date: 10/01/2024 EXAM: 2 VIEW(S) XRAY OF THE FEMUR 10/01/2024 09:20:00 PM COMPARISON: None available. CLINICAL HISTORY: fall, pain FINDINGS: BONES AND JOINTS: No acute fracture. No focal osseous lesion. No joint dislocation. Right hip arthroplasty. No evidence of loosening. Moderate tricompartmental degenerative changes of the knee. SOFT TISSUES: The soft tissues are unremarkable. IMPRESSION: 1. No acute fracture or dislocation. 2. Moderate tricompartmental degenerative changes of the knee. Electronically signed by: Norman Gatlin MD 10/01/2024 09:38 PM EST RP Workstation: [id number]   DG HIP UNILAT W OR W/O PELVIS 2-3 VIEWS RIGHT Result Date: 10/01/2024 EXAM: 2 or 3 VIEW(S) XRAY OF THE RIGHT HIP 10/01/2024 09:20:00 PM COMPARISON: None available. CLINICAL HISTORY: fall, pain FINDINGS: BONES AND JOINTS: Right hip arthroplasty. No evidence of loosening. No acute fracture or dislocation. SOFT TISSUES: The soft tissues are unremarkable. IMPRESSION: 1. No acute fracture or dislocation. Electronically signed by: Norman Gatlin MD 10/01/2024 09:36 PM EST RP Workstation: [id number]   DG Knee Complete 4 Views Right Result Date: 10/01/2024 CLINICAL DATA:  Recent fall with knee pain, initial encounter EXAM: RIGHT KNEE - COMPLETE 4+ VIEW COMPARISON:  None Available. FINDINGS: Tricompartmental degenerative changes are noted. No acute fracture or dislocation is noted. No joint effusion is seen. No soft tissue changes are noted.  IMPRESSION: Degenerative change without acute abnormality. Electronically Signed   By: Oneil Devonshire M.D.   On: 10/01/2024 19:27   CT Hip  Right Wo Contrast Result Date: 09/28/2024 EXAM: CT OF THE RIGHT HIP WITHOUT IV CONTRAST 09/28/2024 01:06:59 PM TECHNIQUE: CT of the right hip was performed without the administration of intravenous contrast. Multiplanar reformatted images are provided for review. Automated exposure control, iterative reconstruction, and/or weight based adjustment of the mA/kV was utilized to reduce the radiation dose to as low as reasonably achievable. COMPARISON: Right hip radiograph dated 09/28/2024. CLINICAL HISTORY: Pain. Concern for possible right pubic rami fx on xray. FINDINGS: BONES/JOINT: No acute fracture or dislocation. Status post right total hip arthroplasty. Hardware is intact with normal alignment. The visualized right sacroiliac joint is anatomically aligned with mild degenerative arthropathy. The pubic symphysis is anatomically aligned. SOFT TISSUE: No significant soft tissue edema or fluid collections. No enlarged lymph nodes identified in the field of view. INTRAPELVIC CONTENTS: Excreted contrast is noted within the bladder. Limited images of the intrapelvic contents are otherwise unremarkable. IMPRESSION: 1. No acute fracture or dislocation. 2. Intact right total hip arthroplasty with normal alignment. 3. Mild degenerative arthropathy of the visualized right sacroiliac joint. Electronically signed by: Harrietta Sherry MD 09/28/2024 01:15 PM EST RP Workstation: [id number]   DG Hip Unilat With Pelvis 2-3 Views Right Result Date: 09/28/2024 EXAM: 2 OR MORE VIEW(S) XRAY OF THE RIGHT HIP 09/28/2024 11:55:19 AM COMPARISON: 10/27/2023 CLINICAL HISTORY: right hip pain recent sx FINDINGS: BONES AND JOINTS: Right hip arthroplasty in place with components in expected location. Question nondisplaced right inferior pubic ramus fracture. Degenerative changes in left hip. LUMBAR SPINE: Degenerative changes in lower lumbar spine. SOFT TISSUES: Evaluation is limited by soft tissue overlap. IMPRESSION: 1. Question nondisplaced  right inferior pubic ramus fracture; evaluation limited by soft tissue overlap. Recommend CT for further evaluation. 2. Right hip arthroplasty with components in expected location. Electronically signed by: Norman Gatlin MD 09/28/2024 12:15 PM EST RP Workstation: [id number]   CT ABDOMEN PELVIS W CONTRAST Result Date: 09/28/2024 EXAM: CT ABDOMEN AND PELVIS WITH CONTRAST 09/28/2024 10:36:23 AM TECHNIQUE: CT of the abdomen and pelvis was performed with the administration of 100 mL of iohexol  (OMNIPAQUE ) 350 MG/ML injection. Multiplanar reformatted images are provided for review. Automated exposure control, iterative reconstruction, and/or weight-based adjustment of the mA/kV was utilized to reduce the radiation dose to as low as reasonably achievable. COMPARISON: CT Abdomen and Pelvis dated 09/16/2020. Unenhanced study dated 09/16/2024. CLINICAL HISTORY: right flank, RLQ, right groin pain FINDINGS: LOWER CHEST: Calcified granuloma identified in the left lower lobe. LIVER: The liver is unremarkable. GALLBLADDER AND BILE DUCTS: Status post cholecystectomy. No significant bile duct dilatation. SPLEEN: Scattered calcified granulomas throughout the spleen. PANCREAS: Compared with the unenhanced study from 09/16/2024, there is a stable enhancement lesion within the anterior pancreatic neck measuring 1.4 cm (image 33/2). 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 No pancreatic mass or main duct dilatation. ADRENAL GLANDS: Normal size and morphology bilaterally. No nodule, thickening, or hemorrhage. No periadrenal stranding. KIDNEYS, URETERS AND BLADDER: Right renal atrophy. Bilateral Bosniak class I and II kidney cysts. The largest arises off the inferior pole of the left kidney measuring 3.2 cm. No follow-up imaging recommended. A 5 mm stone is identified within the upper pole of the right kidney. No hydronephrosis. No perinephric or periureteral stranding. Urinary bladder is unremarkable. GI AND BOWEL:  Stomach demonstrates no acute abnormality. The appendix is visualized and normal in caliber, without wall thickening,  periappendiceal inflammation, or fluid. Colonic diverticulosis. No signs of acute diverticulitis. There is no bowel obstruction. PERITONEUM AND RETROPERITONEUM: No ascites. No free air. VASCULATURE: Aorta is normal in caliber. LYMPH NODES: No lymphadenopathy. REPRODUCTIVE ORGANS: The prostate gland is unremarkable. BONES AND SOFT TISSUES: Lumbar spondylosis. Similar appearance of mild L4 compression deformity. Multilevel lumbar degenerative disc disease. No acute osseous abnormality. No focal soft tissue abnormality. IMPRESSION: 1. 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 Recommend nonemergent referral to oncology for further management. 2. Nonobstructing right renal calculi. Electronically signed by: Waddell Calk MD 09/28/2024 11:01 AM EST RP Workstation: [id number]   US  SCROTUM W/DOPPLER Result Date: 09/28/2024 EXAM: ULTRASOUND SCROTUM/TESTICLES WITH DOPPLER FLOW EVALUATION 09/28/2024 10:11:55 AM TECHNIQUE: Duplex ultrasound using B-mode/gray scaled imaging, Doppler spectral analysis and color flow Doppler was obtained of the testicles. COMPARISON: None available. CLINICAL HISTORY: Intermittent right groin pain for 2-3 weeks. FINDINGS: RIGHT: MEASUREMENTS: Right testicle measures 3.7 x 1.7 x 2.8 cm. GREY SCALE: The right testicle demonstrates normal homogeneous echotexture without focal lesion. No testicular microlithiasis. DOPPLER EVALUATION: There is normal arterial and venous Doppler flow within the testicle. VARICOCELE: No scrotal varicocele. SCROTAL SAC: No hydrocele. EPIDIDYMIS: No acute abnormality. LEFT: MEASUREMENTS: Left testicle measures 4.0 x 1.8 x 2.9 cm. GREY SCALE: The left testicle demonstrates normal homogeneous echotexture without focal lesion. No testicular microlithiasis. DOPPLER EVALUATION: There is  normal arterial and venous Doppler flow within the testicle. VARICOCELE: No scrotal varicocele. SCROTAL SAC: No hydrocele. EPIDIDYMIS: 2 small cysts noted within the left epididymis measuring up to 6 mm. IMPRESSION: 1. No acute testicular abnormality to explain intermittent right groin pain. 2. Two small left epididymal cysts, up to 6 mm. Electronically signed by: Waddell Calk MD 09/28/2024 10:36 AM EST RP Workstation: [id number]   CT Renal Stone Study Result Date: 09/16/2024 CLINICAL DATA:  Three-week history of right flank pain and right upper leg pain associated with hematuria EXAM: CT ABDOMEN AND PELVIS WITHOUT CONTRAST TECHNIQUE: Multidetector CT imaging of the abdomen and pelvis was performed following the standard protocol without IV contrast. RADIATION DOSE REDUCTION: This exam was performed according to the departmental dose-optimization program which includes automated exposure control, adjustment of the mA and/or kV according to patient size and/or use of iterative reconstruction technique. COMPARISON:  CTA chest dated 05/25/2024 and multiple priors dating back to CT abdomen and pelvis dated 05/23/2011 FINDINGS: Lower chest: Unchanged calcified left lower lobe granuloma. Partially imaged lingular subsegmental atelectasis/scarring. No pleural effusion or pneumothorax demonstrated. Partially imaged heart size is normal. Hepatobiliary: No focal hepatic lesions. No intra or extrahepatic biliary ductal dilation. Cholecystectomy. Pancreas: No main pancreatic ductal dilation. Hyperattenuating rounded lesion in the anterior pancreatic neck measuring 1.4 x 1.4 cm (2:30), not substantially changed in size from recent studies but slowly increasing in size from 1.1 cm on 05/23/2011. Spleen: Normal splenic size. Scattered calcified splenic granulomata. Adrenals/Urinary Tract: No adrenal nodules. Atrophic right kidney. Bilateral hypodensities, likely cysts. Bilateral nonobstructing stones measuring up to 4 mm in  the upper pole right kidney. No hydronephrosis. No focal bladder wall thickening. Stomach/Bowel: Normal appearance of the stomach. No evidence of bowel wall thickening, distention, or inflammatory changes. Colonic diverticulosis without acute diverticulitis. Normal appendix. Vascular/Lymphatic: Aortic atherosclerosis. No enlarged abdominal or pelvic lymph nodes. Reproductive: Prostate is unremarkable. Other: No free fluid, fluid collection, or free air. Musculoskeletal: No acute or abnormal lytic or blastic osseous lesions. Partially imaged right hip arthroplasty. Multilevel degenerative changes of the partially imaged thoracic  and lumbar spine. IMPRESSION: 1. Bilateral nonobstructing renal stones measuring up to 4 mm in the upper pole right kidney. No hydronephrosis. 2. Hyperattenuating rounded lesion in the anterior pancreatic neck measuring 1.4 x 1.4 cm, not substantially changed in size from recent studies but slowly increasing in size since 05/23/2011, suspicious for a pancreatic neuroendocrine tumor. Recommend further evaluation with nonemergent contrast-enhanced MRI/MRCP or pancreas protocol CT abdomen if not previously performed. 3.  Aortic Atherosclerosis (ICD10-I70.0). Electronically Signed   By: Limin  Xu M.D.   On: 09/16/2024 12:38    Microbiology: Results for orders placed or performed during the hospital encounter of 03/11/24  MRSA Next Gen by PCR, Nasal     Status: None   Collection Time: 03/12/24  5:20 AM   Specimen: Nasal Mucosa; Nasal Swab  Result Value Ref Range Status   MRSA by PCR Next Gen NOT DETECTED NOT DETECTED Final    Comment: (NOTE) The GeneXpert MRSA Assay (FDA approved for NASAL specimens only), is one component of a comprehensive MRSA colonization surveillance program. It is not intended to diagnose MRSA infection nor to guide or monitor treatment for MRSA infections. Test performance is not FDA approved in patients less than 45 years old. Performed at [facility], [address] [place], [place] [address]     Labs: CBC: Recent Labs  Lab 10/08/24 1740 10/09/24 0420  WBC 9.0 9.3  NEUTROABS 5.7  --   HGB 16.1 14.0  HCT 49.1 44.1  MCV 90.8 92.5  PLT 192 162   Basic Metabolic Panel: Recent Labs  Lab 10/08/24 1740 10/09/24 0420  NA 139 139  K 4.0 4.0  CL 100 103  CO2 25 25  GLUCOSE 109* 123*  BUN 43* 47*  CREATININE 2.34* 2.53*  CALCIUM  8.7* 8.3*  MG  --  2.6*  PHOS  --  6.7*   Liver Function Tests: Recent Labs  Lab 10/08/24 1740  AST 26  ALT 34  ALKPHOS 53  BILITOT 0.9  PROT 6.8  ALBUMIN  4.0   CBG: No results for input(s): GLUCAP in the last 168 hours.  Discharge time spent:  35 minutes.  Signed: Eric Nunnery, MD [facility] 10/09/2024

## 2024-10-09 NOTE — Assessment & Plan Note (Signed)
-   Stable and well-controlled - Good saturation on room air appreciated - Continue home bronchodilator management.

## 2024-10-09 NOTE — Assessment & Plan Note (Signed)
-  Body mass index is 41.56 kg/m. - Low-carb diet and portion control discussed with patient.

## 2024-10-09 NOTE — Evaluation (Signed)
 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  He had a fall 1 week ago while trying to get into his truck.  He owns and runs a dealer.  Denies any trauma or twisting to his back or hips.  The pain has kept him up at night as he can't find a comfortable position in bed.  He presents to the ER to get a solution to his pain so he can return to working as usual. (per MD)     Clinical Impressions Pt agreeable to OT and PT co-evaluation. Prior to 3 weeks ago the pt ambulated without AD, recently the pt has needed the RW. Pt works and is independent other than assist from spouse to don socks. Pt demonstrated good ability to complete seated ADL's with assist for socks and possibly min A for bathing to fully wash legs, but wife reports having no issues donning socks for the pt. Pt's balance and R LE strength is the primary deficit today with pt needing CGA with the RW for ambulation. Deferring to PT for further therapy for balance and LE strength. Pt left in the chair with family present. Pt is not recommended for any further acute OT services and will be discharged to care of nursing staff for remaining length of stay.       If plan is discharge home, recommend the following:   A little help with walking and/or transfers;A little help with bathing/dressing/bathroom;Help with stairs or ramp for entrance     Functional Status  Assessment   Patient has had a recent decline in their functional status and demonstrates the ability to make significant improvements in function in a reasonable and predictable amount of time.     Equipment Recommendations   Tub/shower seat            Precautions/Restrictions   Precautions Precautions: Fall Recall of Precautions/Restrictions: Intact Restrictions Weight Bearing Restrictions Per Provider Order: No     Mobility Bed Mobility Overal bed mobility: Modified Independent             General bed mobility comments: HOB elevated    Transfers Overall transfer level: Needs assistance   Transfers: Sit to/from Stand, Bed to chair/wheelchair/BSC Sit to Stand: Contact guard assist     Step pivot transfers: Contact guard assist     General transfer comment: EOB to chair without AD      Balance Overall balance assessment: Needs assistance Sitting-balance support: No upper extremity supported, Feet supported Sitting balance-Leahy Scale: Good Sitting balance - Comments: seated at EOB   Standing balance support: Bilateral upper extremity supported, During functional activity, Reliant on assistive device for balance Standing balance-Leahy Scale: Fair Standing balance comment: with RW                           ADL either performed or assessed with clinical judgement   ADL Overall ADL's : Needs assistance/impaired     Grooming: Modified independent;Sitting  Upper Body Bathing: Modified independent;Independent   Lower Body Bathing: Sitting/lateral leans;Minimal assistance   Upper Body Dressing : Modified independent   Lower Body Dressing: Moderate assistance;Sitting/lateral leans Lower Body Dressing Details (indicate cue type and reason): Wife assists with socks at baseline. Toilet Transfer: Contact guard assist;Ambulation;Rolling walker (2 wheels) Toilet Transfer Details (indicate cue type and reason): Simulated via ambulation in the  hall.         Functional mobility during ADLs: Contact guard assist;Rolling walker (2 wheels)       Vision   Vision Assessment?: No apparent visual deficits     Perception Perception: Not tested       Praxis Praxis: Not tested       Pertinent Vitals/Pain Pain Assessment Pain Assessment: 0-10 Pain Score: 3  Pain Location: R hip, stomach, groin, and above R ankle. Pain Descriptors / Indicators: Cramping Pain Intervention(s): Monitored during session, Repositioned, Limited activity within patient's tolerance     Extremity/Trunk Assessment Upper Extremity Assessment Upper Extremity Assessment: Overall WFL for tasks assessed   Lower Extremity Assessment Lower Extremity Assessment: Defer to PT evaluation   Cervical / Trunk Assessment Cervical / Trunk Assessment: Normal   Communication Communication Communication: No apparent difficulties   Cognition Arousal: Alert Behavior During Therapy: WFL for tasks assessed/performed Cognition: No apparent impairments                               Following commands: Intact       Cueing  General Comments   Cueing Techniques: Verbal cues                 Home Living Family/patient expects to be discharged to:: Private residence Living Arrangements: Spouse/significant other Available Help at Discharge: Family;Available 24 hours/day Type of Home: House Home Access: Stairs to enter [employer] of Steps: 6 Entrance Stairs-Rails: Can reach both Home Layout: One level     Bathroom Shower/Tub: Producer, Television/film/video: Standard Bathroom Accessibility: Yes How Accessible: Accessible via walker;Accessible via wheelchair Home Equipment: Cane - single Librarian, Academic (2 wheels)          Prior Functioning/Environment Prior Level of Function : Working/employed;Driving;Needs assist;History of Falls (last six months)       Physical Assist : ADLs (physical)   ADLs (physical):  Dressing Mobility Comments: Community ambulation without AD, drives. Has needed RW the past 3 weeks. ADLs Comments: Baseline assist for donning socks; independent otherwise.                            Co-evaluation PT/OT/SLP Co-Evaluation/Treatment: Yes Reason for Co-Treatment: To address functional/ADL transfers   OT goals addressed during session: ADL's and self-care                       End of Session Equipment Utilized During Treatment: Rolling walker (2 wheels);Gait belt  Activity Tolerance: Patient tolerated treatment well Patient left: in chair;with call bell/phone within reach;with nursing/sitter in room  OT Visit Diagnosis: Unsteadiness on feet (R26.81);Other abnormalities of gait and mobility (R26.89);Muscle weakness (generalized) (M62.81);History of falling (Z91.81);Repeated falls (R29.6)                Time: 9144-9086 OT Time Calculation (min): 18 min Charges:  OT General Charges $OT Visit: 1 Visit OT Evaluation $OT Eval Low Complexity: 1 Low  Fardowsa Authier OT, MOT  [employer]  10/09/2024, 10:28 AM

## 2024-10-09 NOTE — Progress Notes (Signed)
 Referring Physician:  Kayla Jeoffrey RAMAN, FNP [address] [place],  [place] [address]  Primary Physician:  Aaron Jeoffrey RAMAN, FNP  History of Present Illness: 10/13/2024 [name] is here today with a chief complaint of worsening right leg pain and instability.  He underwent a right total hip replacement in April with good early relief, but after 3 to 4 months developed progressive right leg pain that has continued to worsen.  The pain involves the right groin and leg, sometimes radiating to the ankle and top of the foot. It is worse when lying down and now limits him to very short walking distances such as from his lift chair to the bathroom.  On November 12 his right leg gave way while he was getting into his truck, causing a fall. His leg has also given way at home, which he attributes to pain rather than weakness. He has numbness over the top of the right leg since surgery, without numbness or tingling below the knee.  He is taking gabapentin  300 mg three times daily, methocarbamol , and oxycodone  as needed. Pain medication allows him to sleep. 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     Discussed the use of AI scribe software for clinical note transcription with the patient, who gave verbal consent to proceed. Bowel/Bladder Dysfunction: none  Conservative measures:  Physical therapy:  HH PT was recommended at the hospital upon discharge but no referral was placed  Multimodal medical therapy including regular antiinflammatories:   Prednisone , Tramadol , Neurontin , Tylenol , oxycodone  and Robaxin  Injections: no epidural steroid injections  Past Surgery: no spine surgery  Aaron Clarke has no symptoms of cervical myelopathy.  The symptoms are causing a significant impact on the patient's life.   I have utilized the care everywhere function in epic to review the outside records  available from external health systems.   Review of Systems:  A 10 point review of systems is negative, except for the pertinent positives and negatives detailed in the HPI.  Past Medical History: 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   Family history of breast cancer    mother   Hypertension    Osteoarthritis of right hip    PE (pulmonary thromboembolism) (HCC) 10/23/2022    Past Surgical History: Past Surgical History:  Procedure Laterality Date   CHOLECYSTECTOMY  12/19/2011   Procedure: LAPAROSCOPIC CHOLECYSTECTOMY WITH INTRAOPERATIVE CHOLANGIOGRAM;  Surgeon: Elon CHRISTELLA Pacini, MD;  Location: [facility] OR;  Service: General;  Laterality: N/A;   COLONOSCOPY WITH PROPOFOL  N/A 09/13/2022   Procedure: COLONOSCOPY WITH PROPOFOL ;  Surgeon: Unk Corinn Skiff, MD;  Location: [facility] ENDOSCOPY;  Service: Gastroenterology;  Laterality: N/A;   Colonscopy     Fatty tumor  2003   L chest   LEFT HEART CATH AND CORONARY ANGIOGRAPHY N/A 08/01/2024   Procedure: LEFT HEART CATH AND CORONARY ANGIOGRAPHY;  Surgeon: Anner Alm ORN, MD;  [facility] INVASIVE CV LAB;  Service: Cardiovascular;  Laterality: N/A;   TOTAL HIP ARTHROPLASTY Right 02/19/2024   Procedure: ARTHROPLASTY, HIP, TOTAL, ANTERIOR APPROACH;  Surgeon: Vernetta Lonni GRADE, MD;  Location: [facility] OR;  Service: Orthopedics;  Laterality: Right;    Allergies: Allergies as of 10/13/2024   (No Known Allergies)  Medications:  Current Outpatient Medications:    acetaminophen  (TYLENOL ) 500 MG tablet, Take 2 tablets (1,000 mg total) by mouth every 8 (eight) hours., Disp: , Rfl:    albuterol  (PROVENTIL ) (2.5 MG/3ML) 0.083% nebulizer solution, Take 3 mLs (2.5 mg total) by nebulization every 6 (six) hours as needed for wheezing or shortness of breath., Disp: 75 mL, Rfl: 0   albuterol  (VENTOLIN  HFA) 108 (90 Base)  MCG/ACT inhaler, Inhale 1-2 puffs into the lungs every 6 (six) hours as needed for wheezing or shortness of breath., Disp: 8 g, Rfl: 0   apixaban  (ELIQUIS ) 5 MG TABS tablet, Take 1 tablet (5 mg total) by mouth 2 (two) times daily., Disp: 60 tablet, Rfl: 1   bisoprolol  (ZEBETA ) 5 MG tablet, TAKE 1 TABLET BY MOUTH DAILY, Disp: 100 tablet, Rfl: 2   budesonide -formoterol  (SYMBICORT ) 160-4.5 MCG/ACT inhaler, USE 2 INHALATIONS BY MOUTH FIRST THING IN THE MORNING AND THEN  ANOTHER 2 INHALATIONS AFTER  ABOUT 12 HOURS LATER, Disp: 30.6 g, Rfl: 3   gabapentin  (NEURONTIN ) 300 MG capsule, Take 1 capsule (300 mg total) by mouth 3 (three) times daily., Disp: 90 capsule, Rfl: 1   lidocaine  (LIDODERM ) 5 %, Place 1 patch onto the skin daily., Disp: , Rfl:    losartan  (COZAAR ) 25 MG tablet, Take 1 tablet (25 mg total) by mouth daily., Disp: 30 tablet, Rfl: 3   methocarbamol  (ROBAXIN ) 500 MG tablet, Take 1.5 tablets (750 mg total) by mouth every 8 (eight) hours as needed for muscle spasms., Disp: 90 tablet, Rfl: 0   oxyCODONE  (OXY IR/ROXICODONE ) 5 MG immediate release tablet, Take 1-2 tablets (5-10 mg total) by mouth every 6 (six) hours as needed for severe pain (pain score 7-10)., Disp: 30 tablet, Rfl: 0   pantoprazole  (PROTONIX ) 40 MG tablet, Take 40 mg by mouth daily before breakfast., Disp: , Rfl:    polyethylene glycol (MIRALAX  / GLYCOLAX ) 17 g packet, Take 17 g by mouth daily., Disp: 28 each, Rfl: 1   rosuvastatin  (CRESTOR ) 10 MG tablet, Take 1 tablet (10 mg total) by mouth daily., Disp: 90 tablet, Rfl: 2  Social History: Social History   Tobacco Use   Smoking status: Former    Current packs/day: 0.00    Types: Cigarettes    Start date: 06/19/1974    Quit date: 06/19/1988    Years since quitting: 36.3   Smokeless tobacco: Never  Vaping Use   Vaping status: Never Used  Substance Use Topics   Alcohol use: Not Currently    Comment: Quit drinking in 2023   Drug use: No    Family Medical History: Family  History  Problem Relation Age of Onset   Cancer Mother        breast   Cancer Father        colon   Heart disease Father    Asthma Sister    Anesthesia problems Neg Hx     Physical Examination: Vitals:   10/13/24 1313  BP: 130/78    General: Patient is in no apparent distress. Attention to examination is appropriate.  Neck:   Supple.  Full range of motion.  Respiratory: Patient is breathing without any difficulty.   NEUROLOGICAL:     Awake, alert, oriented to person, place, and time.  Speech is clear and fluent.   Cranial Nerves: Pupils equal round and reactive to light.  Facial tone is symmetric.  Facial sensation is symmetric. Shoulder shrug is symmetric. Tongue protrusion is midline.  There is no pronator  drift.  Strength: Side Biceps Triceps Deltoid Interossei Grip Wrist Ext. Wrist Flex.  R 5 5 5 5 5 5 5   L 5 5 5 5 5 5 5    Side Iliopsoas Quads Hamstring PF DF EHL  R 5 5 5 5 5 5   L 5 5 5 5 5 5    Reflexes are 1+ and symmetric at the biceps, triceps, brachioradialis, patella and achilles.   Hoffman's is absent.   Bilateral upper and lower extremity sensation is intact to light touch.    No evidence of dysmetria noted.  Gait is abnormal-uses a walker.  FABER negative bilaterally.   Medical Decision Making  Imaging: MRI Hip 10/01/2024 IMPRESSION: 1. The right hip arthroplasty components appear well seated without complicating features. No findings suspicious for periprosthetic fracture. 2. Inflammations/edema noted in the right gluteal muscles suggesting muscle strains or contusions possibly related to falling. No intramuscular hematoma. 3. Moderate inflammations/edema/fluid also noted in the subcutaneous tissues likely from related to a contusion. No discrete or large hematoma. 4. No significant intrapelvic abnormalities.     Electronically Signed   By: MYRTIS Stammer M.D.   On: 10/09/2024 10:48  MRI L spine 10/01/2024 IMPRESSION: 1. Transitional  lumbosacral anatomy with a fully lumbarized S1 level; correlation with radiographs is recommended prior to any operative intervention. 2. Hyperostosis with sub-total spinal interbody ankylosis; absent in the lumbar spine only at L4L5 and L5S1. 3. L5S1 bulky disc herniation contributes in large part to severe spinal and bilateral lateral recess stenosis. Query S1 radiculitis. Moderate bilateral L5 neural foraminal stenosis. 4. L4L5 bulky disc and endplate degeneration asymmetric to the left with moderate to severe left L4 foraminal stenosis.   Electronically signed by: Helayne Hurst MD 10/09/2024 06:24 AM EST RP Workstation: [id number]  I have personally reviewed the images and agree with the above interpretation.  Assessment and Plan: Mr. Ratz is a pleasant 68 y.o. male with lumbar radiculopathy as well as right groin pain after a right hip total arthroplasty.  For his right leg pain, I think he has a component of radicular pain as well as pain from his fall.  I would like to start him on some physical therapy for his back.  I will reach out to his orthopedist regarding his hip.  He is seeing the Ortho team on December 1.  I will also refer him for an epidural injection.  I will see him back in 8 weeks.  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 MRI shows arthritic changes, bone spurs, disc herniation, nerve root compression. Differential includes hip and back issues. Non-surgical management preferred. Surgery considered if non-surgical measures fail. - Referred to physical therapy for six weeks. - Increased gabapentin  dosage, starting with an additional pill at night. - Referred to Dr. Eldonna for spinal injection. - Continue oxycodone  as needed. - Monitor symptoms and adjust treatment plan.  I spent a total of 30 minutes in this patient's care today. This time was spent  reviewing pertinent records including imaging studies, obtaining and confirming history, performing a directed evaluation, formulating and discussing my recommendations, and documenting the visit within the medical record.      Thank you for involving me in the care of this patient.      Shaunda Tipping K. Clois MD, [facility]surgery

## 2024-10-13 ENCOUNTER — Ambulatory Visit: Admitting: Neurosurgery

## 2024-10-13 VITALS — BP 130/78 | Ht 72.0 in | Wt 311.0 lb

## 2024-10-13 DIAGNOSIS — Z96641 Presence of right artificial hip joint: Secondary | ICD-10-CM

## 2024-10-13 DIAGNOSIS — G8929 Other chronic pain: Secondary | ICD-10-CM

## 2024-10-13 DIAGNOSIS — R1031 Right lower quadrant pain: Secondary | ICD-10-CM

## 2024-10-13 DIAGNOSIS — M4726 Other spondylosis with radiculopathy, lumbar region: Secondary | ICD-10-CM

## 2024-10-13 DIAGNOSIS — W19XXXA Unspecified fall, initial encounter: Secondary | ICD-10-CM

## 2024-10-13 DIAGNOSIS — M5416 Radiculopathy, lumbar region: Secondary | ICD-10-CM

## 2024-10-13 MED ORDER — OXYCODONE HCL 5 MG PO TABS: 5.0000 mg | ORAL_TABLET | Freq: Four times a day (QID) | ORAL | 0 refills | Status: AC | PRN

## 2024-10-14 ENCOUNTER — Encounter: Payer: Self-pay | Admitting: Family Medicine

## 2024-10-14 ENCOUNTER — Ambulatory Visit (INDEPENDENT_AMBULATORY_CARE_PROVIDER_SITE_OTHER): Admitting: Family Medicine

## 2024-10-14 VITALS — BP 143/76 | HR 68 | Temp 98.1°F | Ht 72.0 in | Wt 315.0 lb

## 2024-10-14 DIAGNOSIS — Z96641 Presence of right artificial hip joint: Secondary | ICD-10-CM

## 2024-10-14 DIAGNOSIS — M5440 Lumbago with sciatica, unspecified side: Secondary | ICD-10-CM

## 2024-10-14 DIAGNOSIS — N1831 Chronic kidney disease, stage 3a: Secondary | ICD-10-CM

## 2024-10-14 MED ORDER — LOSARTAN POTASSIUM 25 MG PO TABS: 25.0000 mg | ORAL_TABLET | Freq: Every day | ORAL | 3 refills | Status: AC

## 2024-10-14 NOTE — Progress Notes (Signed)
 Acute Office Visit  Patient ID: Aaron Clarke, male    DOB: 12/12/1955, 68 y.o.   MRN: [id number]  PCP: Kayla Jeoffrey RAMAN, FNP  Chief Complaint  Patient presents with   Hospitalization Follow-up    10/08/2024 AKI - requesting pain meds, given in ER but not enough.  Possible back surgery and hip pain. Lidocain prescription questions.     Subjective:     HPI  Aaron Clarke is here today for HFU for right hip pain and AKI. Was seen in the ED on 10/09/2024, see below for reference.  Discussed the use of AI scribe software for clinical note transcription with the patient, who gave verbal consent to proceed.  History of Present Illness Aaron Clarke is a 68 year old male who presents for ED follow up for right hip pain. History includes right hip arthroplasty.  He has been experiencing severe back and leg pain, radiating from his ankle up through his groin, similar to the pain before his hip replacement but described as twice as severe. The pain disrupted his sleep, preventing him from resting in his recliner or bed. This exacerbation of pain led him to the ED. MRI shows arthritic changes, bone spurs, disc herniation, nerve root compression. Since his visit he takes oxycodone  one to two times a day, which helps him sleep at night and improves his pain  He was referred to neurosurgery who recommended spinal ESI and physical therapy, with surgery as a potential option if these measures do not alleviate the pain. He was started on Gabapentin  and Oxycodone . Sees ortho next week.   He was advised to push fluids due to an acute kidney injury and is not taking ibuprofen. His current medications include oxycodone  and gabapentin . He has not been monitoring his blood pressure at home but plans to start.  Denies chest pain, palpitations, recurrent headaches, vision changes, lightheadedness, dizziness, dyspnea on exertion, or swelling of extremities.   Neurosurgery A&P 10/13/2024 for  reference only: Assessment and Plan: Aaron Clarke is a pleasant 68 y.o. male with lumbar radiculopathy as well as right groin pain after a right hip total arthroplasty.   For his right leg pain, I think he has a component of radicular pain as well as pain from his fall.  I would like to start him on some physical therapy for his back.   I will reach out to his orthopedist regarding his hip.  He is seeing the Ortho team on December 1.  I will also refer him for an epidural injection.  I will see him back in 8 weeks.   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 MRI shows arthritic changes, bone spurs, disc herniation, nerve root compression. Differential includes hip and back issues. Non-surgical management preferred. Surgery considered if non-surgical measures fail. - Referred to physical therapy for six weeks. - Increased gabapentin  dosage, starting with an additional pill at night. - Referred to Dr. Eldonna for spinal injection. - Continue oxycodone  as needed. - Monitor symptoms and adjust treatment plan.  DC Summary 10/09/2024 for reference only: Brief Hospital admission narrative: As per H&P written by Dr. 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  He had a fall 1 week ago while trying to get into his truck.  He owns and runs a dealer.  Denies any trauma or twisting to his back or hips.  The pain has kept him up at night as he can't find a  comfortable position in bed.  He presents to the ER to get a solution to his pain so he can return to working as usual.   In the ER, vital signs are stable.  EDP discussed the case with orthopedic surgery who recommended MRI of right hip and lumbar spine which were ordered by EDP.  Lab studies were notable for elevated BUN and creatinine above baseline.  EDP requested admission for management of right hip pain and AKI.  Admitted by [facility], hospitalist service.   ED Course: Temperature 98.2.  BP 111/73, pulse 65, respiration rate 18, O2 saturation 95% on room air. Assessment & Plan Pain of right hip - Images demonstrating a stable prior hip implants - Continue as needed analgesics - Continue recommendations by PT - No further interventions for his right hip anticipated. Obesity, Class III, BMI 40-49.9 (morbid obesity) (HCC) -Body mass index is 41.56 kg/m. - Low-carb diet and portion control discussed with patient. Essential hypertension - Resume adjusted antihypertensive agents - Heart healthy/low-sodium diet discussed with patient. GERD (gastroesophageal reflux disease) - Continue PPI. Back pain with sciatica - Associated with herniated disc (L5-S1). - Patient has completed treatment with steroids, no significant response - Case discussed with neurosurgery service who recommended pain management and outpatient follow-up to determine further intervention/definitive management. -Patient has been discharged on Neurontin , Tylenol , oxycodone  and Robaxin . Hyperlipidemia - Continue statin - Heart healthy/low-fat diet discussed with patient. COPD with asthma (HCC) - Stable and well-controlled - Good saturation on room air appreciated - Continue home bronchodilator management. History of DVT (deep vein thrombosis) - Continue home Eliquis . CKD (chronic kidney disease), stage III (HCC) - Stage IIIa chronic kidney disease - Continue to maintain adequate hydration - Minimize nephrotoxic  agents - Continue to follow renal function trend. - Medications has been adjusted based on renal function. Physical deconditioning - Patient has been seen by physical therapy with recommendation for home health PT at discharge - [employer] has been recommended/arranged at discharge.   Consultants: Neurosurgery curbside (Dr. Rosslyn) Procedures performed: See below for x-ray reports. Disposition: Home with home health services. Diet recommendation: Heart healthy/low calorie and low-sodium diet.  Review of Systems  All other systems reviewed and are negative.   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   Family history of breast cancer    mother   Hypertension    Osteoarthritis of right hip    PE (pulmonary thromboembolism) (HCC) 10/23/2022    Past Surgical History:  Procedure Laterality Date   CHOLECYSTECTOMY  12/19/2011   Procedure: LAPAROSCOPIC CHOLECYSTECTOMY WITH INTRAOPERATIVE CHOLANGIOGRAM;  Surgeon: Elon CHRISTELLA Pacini, MD;  Location: [facility] OR;  Service: General;  Laterality: N/A;   COLONOSCOPY WITH PROPOFOL  N/A 09/13/2022   Procedure: COLONOSCOPY WITH PROPOFOL ;  Surgeon: Unk Corinn Skiff, MD;  Location: [facility] ENDOSCOPY;  Service: Gastroenterology;  Laterality: N/A;   Colonscopy     Fatty tumor  2003   L chest  LEFT HEART CATH AND CORONARY ANGIOGRAPHY N/A 08/01/2024   Procedure: LEFT HEART CATH AND CORONARY ANGIOGRAPHY;  Surgeon: Anner Alm ORN, MD;  Location: [facility] INVASIVE CV LAB;  Service: Cardiovascular;  Laterality: N/A;   TOTAL HIP ARTHROPLASTY Right 02/19/2024   Procedure: ARTHROPLASTY, HIP, TOTAL, ANTERIOR APPROACH;  Surgeon: Vernetta Lonni GRADE, MD;  Location: [facility] OR;  Service: Orthopedics;  Laterality: Right;    Current Outpatient Medications on File Prior to Visit  Medication Sig Dispense Refill   acetaminophen  (TYLENOL ) 500 MG  tablet Take 2 tablets (1,000 mg total) by mouth every 8 (eight) hours.     albuterol  (PROVENTIL ) (2.5 MG/3ML) 0.083% nebulizer solution Take 3 mLs (2.5 mg total) by nebulization every 6 (six) hours as needed for wheezing or shortness of breath. 75 mL 0   albuterol  (VENTOLIN  HFA) 108 (90 Base) MCG/ACT inhaler Inhale 1-2 puffs into the lungs every 6 (six) hours as needed for wheezing or shortness of breath. 8 g 0   apixaban  (ELIQUIS ) 5 MG TABS tablet Take 1 tablet (5 mg total) by mouth 2 (two) times daily. 60 tablet 1   bisoprolol  (ZEBETA ) 5 MG tablet TAKE 1 TABLET BY MOUTH DAILY 100 tablet 2   budesonide -formoterol  (SYMBICORT ) 160-4.5 MCG/ACT inhaler USE 2 INHALATIONS BY MOUTH FIRST THING IN THE MORNING AND THEN  ANOTHER 2 INHALATIONS AFTER  ABOUT 12 HOURS LATER 30.6 g 3   gabapentin  (NEURONTIN ) 300 MG capsule Take 1 capsule (300 mg total) by mouth 3 (three) times daily. 90 capsule 1   lidocaine  (LIDODERM ) 5 % Place 1 patch onto the skin daily.     methocarbamol  (ROBAXIN ) 500 MG tablet Take 1.5 tablets (750 mg total) by mouth every 8 (eight) hours as needed for muscle spasms. 90 tablet 0   oxyCODONE  (OXY IR/ROXICODONE ) 5 MG immediate release tablet Take 1-2 tablets (5-10 mg total) by mouth every 6 (six) hours as needed for up to 7 days for severe pain (pain score 7-10). 42 tablet 0   pantoprazole  (PROTONIX ) 40 MG tablet Take 40 mg by mouth daily before breakfast.     polyethylene glycol (MIRALAX  / GLYCOLAX ) 17 g packet Take 17 g by mouth daily. 28 each 1   rosuvastatin  (CRESTOR ) 10 MG tablet Take 1 tablet (10 mg total) by mouth daily. 90 tablet 2   No current facility-administered medications on file prior to visit.    No Known Allergies     Objective:    BP (!) 143/76   Pulse 68   Temp 98.1 F (36.7 C)   Ht 6' (1.829 m)   Wt (!) 315 lb (142.9 kg)   SpO2 96%   BMI 42.72 kg/m  BP Readings from Last 3 Encounters:  10/14/24 (!) 143/76  10/13/24 130/78  10/09/24 (!) 111/57   Wt  Readings from Last 3 Encounters:  10/14/24 (!) 315 lb (142.9 kg)  10/13/24 (!) 311 lb (141.1 kg)  10/08/24 (!) 315 lb (142.9 kg)      Physical Exam Vitals and nursing note reviewed.  Constitutional:      Appearance: Normal appearance. He is obese.  HENT:     Head: Normocephalic and atraumatic.  Musculoskeletal:     Right hip: Tenderness present. Decreased range of motion.  Skin:    General: Skin is warm and dry.     Capillary Refill: Capillary refill takes less than 2 seconds.  Neurological:     General: No focal deficit present.     Mental Status: He is alert and oriented  to person, place, and time. Mental status is at baseline.  Psychiatric:        Mood and Affect: Mood normal.        Behavior: Behavior normal.        Thought Content: Thought content normal.        Judgment: Judgment normal.       No results found for any visits on 10/14/24.     Assessment & Plan:   Problem List Items Addressed This Visit     Lumbago of lumbar region with sciatica   CKD (chronic kidney disease), stage III (HCC) - Primary   Relevant Orders   Comprehensive metabolic panel with GFR   Other Visit Diagnoses       Status post total replacement of right hip           Assessment and Plan Assessment & Plan Lumbago with sciatica Severe radiating back pain, worsened by walking, affecting sleep. Referred to neurosurgery for further management. - Continue oxycodone  for pain management. - Continue gabapentin  for neuropathic pain. - Proceed with scheduled epidural injection with neurosurgery. - Follow up with neurosurgery for potential surgical intervention if conservative measures fail.  Hip pain after hip replacement Persistent severe hip pain post-replacement, slightly improved with medication at night. - Continue using walker for ambulation. - Follow up with orthopedics next week for further evaluation and management.  Hypertension Elevated blood pressure, possibly due to  discontinuation of valsartan -hydrochlorothiazide . - Continue Losartan  25mg  daily as he is unsure what medications he is taking. - Monitor blood pressure at home over the next week. - Bring blood pressure cuff to next appointment with medications    Meds ordered this encounter  Medications   losartan  (COZAAR ) 25 MG tablet    Sig: Take 1 tablet (25 mg total) by mouth daily.    Dispense:  30 tablet    Refill:  3    Return in about 1 week (around 10/21/2024) for hypertension.  Jeoffrey GORMAN Barrio, FNP [facility]

## 2024-10-15 ENCOUNTER — Ambulatory Visit: Payer: Self-pay | Admitting: Family Medicine

## 2024-10-15 ENCOUNTER — Telehealth: Payer: Self-pay | Admitting: Physical Medicine and Rehabilitation

## 2024-10-15 LAB — COMPREHENSIVE METABOLIC PANEL WITH GFR
AG Ratio: 1.4 (calc) (ref 1.0–2.5)
ALT: 23 U/L (ref 9–46)
AST: 15 U/L (ref 10–35)
Albumin: 4 g/dL (ref 3.6–5.1)
Alkaline phosphatase (APISO): 42 U/L (ref 35–144)
BUN/Creatinine Ratio: 21 (calc) (ref 6–22)
BUN: 34 mg/dL — ABNORMAL HIGH (ref 7–25)
CO2: 25 mmol/L (ref 20–32)
Calcium: 9.5 mg/dL (ref 8.6–10.3)
Chloride: 102 mmol/L (ref 98–110)
Creat: 1.62 mg/dL — ABNORMAL HIGH (ref 0.70–1.35)
Globulin: 2.9 g/dL (ref 1.9–3.7)
Glucose, Bld: 107 mg/dL — ABNORMAL HIGH (ref 65–99)
Potassium: 4.7 mmol/L (ref 3.5–5.3)
Sodium: 137 mmol/L (ref 135–146)
Total Bilirubin: 0.8 mg/dL (ref 0.2–1.2)
Total Protein: 6.9 g/dL (ref 6.1–8.1)
eGFR: 47 mL/min/1.73m2 — ABNORMAL LOW (ref >=60)

## 2024-10-15 NOTE — Telephone Encounter (Signed)
 Pt called about an appt for injection with [facility]. Please call when approved and schedule appt. Pt number is [phone].

## 2024-10-16 ENCOUNTER — Other Ambulatory Visit: Payer: Self-pay | Admitting: Orthopaedic Surgery

## 2024-10-20 ENCOUNTER — Ambulatory Visit: Admitting: Physician Assistant

## 2024-10-20 ENCOUNTER — Encounter: Payer: Self-pay | Admitting: Physician Assistant

## 2024-10-20 ENCOUNTER — Other Ambulatory Visit: Payer: Self-pay

## 2024-10-20 ENCOUNTER — Telehealth: Payer: Self-pay | Admitting: Neurosurgery

## 2024-10-20 ENCOUNTER — Telehealth: Payer: Self-pay | Admitting: Physical Medicine and Rehabilitation

## 2024-10-20 DIAGNOSIS — M5416 Radiculopathy, lumbar region: Secondary | ICD-10-CM

## 2024-10-20 DIAGNOSIS — Z96641 Presence of right artificial hip joint: Secondary | ICD-10-CM | POA: Diagnosis not present

## 2024-10-20 MED ORDER — METHYLPREDNISOLONE 4 MG PO TBPK: ORAL_TABLET | ORAL | 0 refills | Status: AC

## 2024-10-20 NOTE — Telephone Encounter (Signed)
 Pt asking for a call when back injection approved. Looks like still pending. Pt number is [phone].

## 2024-10-20 NOTE — Progress Notes (Signed)
 HPI: Mr. Kue 68 year old male who underwent a right total hip arthroplasty by Dr. Vernetta 02/19/2024.  He was last seen by Dr. Vernetta on 06/30/2024 ( 4 months postop ) and was doing well.  Unfortunately about 4 weeks ago he fell since that time has had increased pain down the right leg and has had problems weightbearing.  He is having increasing pain.  He did undergo an MRI of his right hip which was reviewed that shows a muscle strain while in the right gluteus muscles and contusion involving the subcutaneous tissues in the gluteal muscles.  Right hip arthroplasty components appeared well-seated. He also underwent an MRI of his lumbar spine which he is seeing Dr. Katrina .  He states that he is waiting on insurance approval for lumbar epidural steroid injections. Patient notes that his pain medication that he is currently on is not controlling his pain and he is almost out of this.  Currently on oxycodone  and Celebrex .  Having no groin pain.  Physical exam: General well-developed well-nourished male in no acute distress seated in wheelchair. Respirations: Unlabored Right hip: Good range of motion with internal/external rotation cervical flexion of the hip causes no pain.  Right calf supple nontender.  Radiographs: AP pelvis lateral view of the right hip: No acute fractures.  Status post right total hip arthroplasty well-seated components.  Impression: Status post right total hip arthroplasty 02/2024  Plan: Explained to the patient that given his exam and x-rays and also the MRI that it is not felt that the pain he is having down the leg is coming from his hip.  He will continue to work with Dr. Trudi and treatment of his lumbar spine and radiculopathy down the right leg.  He will follow-up with us  at 1 year postop for an AP pelvis lateral view of the right hip.  Be happy to see him back at any point in time if his hip becomes problematic.  Questions were encouraged and answered.  The patiet's  radiographs and clinical findings were reviewed with Dr. Vernetta.

## 2024-10-20 NOTE — Telephone Encounter (Signed)
 I spoke with Aaron Clarke and she verbalized understanding on all. She has requested that we send in the Medrol  dosepak to [facility] in [place].  I have sent in the medication and sent to Dr. Clois for cosign.

## 2024-10-20 NOTE — Telephone Encounter (Signed)
 Patient's significant other, Olam called to let our office know that the patient has gotten significantly worse since he was seen last week. She states that he saw Dr. Gretta for his hip today and was advised the pain was not coming from his back and that he needed to contact our office back. She states that he cannot walk more than two feet without screaming in pain and the medication is not helping more than 30 minutes at a time. She finally states that she may have to take him back to the ER. Please advise.

## 2024-10-21 ENCOUNTER — Other Ambulatory Visit: Payer: Self-pay

## 2024-10-21 ENCOUNTER — Emergency Department (HOSPITAL_COMMUNITY)
Admission: EM | Admit: 2024-10-21 | Discharge: 2024-10-23 | Disposition: A | Discharge status: Rehabilitation Facility | Attending: Emergency Medicine | Admitting: Emergency Medicine

## 2024-10-21 ENCOUNTER — Encounter (HOSPITAL_COMMUNITY): Payer: Self-pay

## 2024-10-21 ENCOUNTER — Ambulatory Visit: Payer: Self-pay

## 2024-10-21 DIAGNOSIS — G8929 Other chronic pain: Secondary | ICD-10-CM | POA: Diagnosis not present

## 2024-10-21 DIAGNOSIS — I129 Hypertensive chronic kidney disease with stage 1 through stage 4 chronic kidney disease, or unspecified chronic kidney disease: Secondary | ICD-10-CM | POA: Insufficient documentation

## 2024-10-21 DIAGNOSIS — J45909 Unspecified asthma, uncomplicated: Secondary | ICD-10-CM | POA: Insufficient documentation

## 2024-10-21 DIAGNOSIS — N189 Chronic kidney disease, unspecified: Secondary | ICD-10-CM | POA: Diagnosis not present

## 2024-10-21 DIAGNOSIS — M545 Low back pain, unspecified: Secondary | ICD-10-CM | POA: Diagnosis not present

## 2024-10-21 DIAGNOSIS — M549 Dorsalgia, unspecified: Secondary | ICD-10-CM | POA: Diagnosis present

## 2024-10-21 LAB — CBC WITH DIFFERENTIAL/PLATELET
Abs Immature Granulocytes: 0.03 K/uL (ref 0.00–0.07)
Basophils Absolute: 0 K/uL (ref 0.0–0.1)
Basophils Relative: 0 %
Eosinophils Absolute: 0 K/uL (ref 0.0–0.5)
Eosinophils Relative: 0 %
HCT: 44.5 % (ref 39.0–52.0)
Hemoglobin: 14.8 g/dL (ref 13.0–17.0)
Immature Granulocytes: 0 %
Lymphocytes Relative: 9 %
Lymphs Abs: 0.7 K/uL (ref 0.7–4.0)
MCH: 30 pg (ref 26.0–34.0)
MCHC: 33.3 g/dL (ref 30.0–36.0)
MCV: 90.3 fL (ref 80.0–100.0)
Monocytes Absolute: 0.2 K/uL (ref 0.1–1.0)
Monocytes Relative: 3 %
Neutro Abs: 6.6 K/uL (ref 1.7–7.7)
Neutrophils Relative %: 88 %
Platelets: 150 K/uL (ref 150–400)
RBC: 4.93 MIL/uL (ref 4.22–5.81)
RDW: 13.7 % (ref 11.5–15.5)
WBC: 7.5 K/uL (ref 4.0–10.5)
nRBC: 0 % (ref 0.0–0.2)

## 2024-10-21 LAB — URINALYSIS, W/ REFLEX TO CULTURE (INFECTION SUSPECTED)
Bacteria, UA: NONE SEEN
Bilirubin Urine: NEGATIVE
Glucose, UA: NEGATIVE mg/dL
Ketones, ur: NEGATIVE mg/dL
Leukocytes,Ua: NEGATIVE
Nitrite: NEGATIVE
Protein, ur: NEGATIVE mg/dL
Specific Gravity, Urine: 1.013 (ref 1.005–1.030)
pH: 5 (ref 5.0–8.0)

## 2024-10-21 LAB — BASIC METABOLIC PANEL WITH GFR
Anion gap: 15 (ref 5–15)
BUN: 44 mg/dL — ABNORMAL HIGH (ref 8–23)
CO2: 20 mmol/L — ABNORMAL LOW (ref 22–32)
Calcium: 9.6 mg/dL (ref 8.9–10.3)
Chloride: 104 mmol/L (ref 98–111)
Creatinine, Ser: 1.44 mg/dL — ABNORMAL HIGH (ref 0.61–1.24)
GFR, Estimated: 54 mL/min — ABNORMAL LOW (ref >60)
Glucose, Bld: 138 mg/dL — ABNORMAL HIGH (ref 70–99)
Potassium: 4.6 mmol/L (ref 3.5–5.1)
Sodium: 138 mmol/L (ref 135–145)

## 2024-10-21 MED ORDER — METHOCARBAMOL 500 MG PO TABS
1000.0000 mg | ORAL_TABLET | Freq: Once | ORAL | Status: AC
  Administered 2024-10-21: 1000 mg via ORAL
  Filled 2024-10-21: qty 2

## 2024-10-21 MED ORDER — HYDROMORPHONE HCL 2 MG PO TABS
2.0000 mg | ORAL_TABLET | Freq: Once | ORAL | Status: AC
  Administered 2024-10-21: 2 mg via ORAL
  Filled 2024-10-21: qty 1

## 2024-10-21 MED ORDER — HYDROMORPHONE HCL 1 MG/ML IJ SOLN
0.5000 mg | Freq: Once | INTRAMUSCULAR | Status: AC
  Administered 2024-10-21: 0.5 mg via INTRAVENOUS
  Filled 2024-10-21: qty 0.5

## 2024-10-21 MED ORDER — HYDROMORPHONE HCL 1 MG/ML IJ SOLN
1.0000 mg | Freq: Once | INTRAMUSCULAR | Status: AC
  Administered 2024-10-21: 1 mg via INTRAVENOUS
  Filled 2024-10-21: qty 1

## 2024-10-21 MED ORDER — ONDANSETRON HCL 4 MG/2ML IJ SOLN
4.0000 mg | Freq: Once | INTRAMUSCULAR | Status: AC
  Administered 2024-10-21: 4 mg via INTRAVENOUS
  Filled 2024-10-21: qty 2

## 2024-10-21 MED ORDER — HYDROMORPHONE HCL 2 MG PO TABS
2.0000 mg | ORAL_TABLET | ORAL | Status: DC | PRN
  Administered 2024-10-21 – 2024-10-23 (×9): 2 mg via ORAL
  Filled 2024-10-21 (×9): qty 1

## 2024-10-21 MED ORDER — LIDOCAINE 5 % EX PTCH
1.0000 | MEDICATED_PATCH | CUTANEOUS | Status: DC
  Administered 2024-10-21 – 2024-10-23 (×3): 1 via TRANSDERMAL
  Filled 2024-10-21 (×3): qty 1

## 2024-10-21 MED ORDER — SENNOSIDES-DOCUSATE SODIUM 8.6-50 MG PO TABS: 1.0000 | ORAL_TABLET | Freq: Every evening | ORAL | 0 refills | Status: AC | PRN

## 2024-10-21 MED ORDER — HYDROMORPHONE HCL 2 MG PO TABS: 2.0000 mg | ORAL_TABLET | Freq: Four times a day (QID) | ORAL | 0 refills | Status: DC | PRN

## 2024-10-21 MED ORDER — SODIUM CHLORIDE 0.9 % IV BOLUS
500.0000 mL | Freq: Once | INTRAVENOUS | Status: AC
  Administered 2024-10-21: 500 mL via INTRAVENOUS

## 2024-10-21 MED ORDER — METHOCARBAMOL 500 MG PO TABS
1000.0000 mg | ORAL_TABLET | Freq: Four times a day (QID) | ORAL | Status: DC
  Administered 2024-10-21 – 2024-10-23 (×7): 1000 mg via ORAL
  Filled 2024-10-21 (×7): qty 2

## 2024-10-21 NOTE — Telephone Encounter (Signed)
 FYI Only or Action Required?: FYI only for provider: Patient in hospital and fiance doesn't feel comfortable bringing him home. Interested in home care.  Patient was last seen in primary care on 10/14/2024 by Kayla Jeoffrey RAMAN, FNP.  Called Nurse Triage reporting Back Pain.  STriage Disposition: Information or Advice Only Call  Patient/caregiver understands and will follow disposition?: Yes  Copied from CRM #[id number]. Topic: Clinical - Red Word Triage >> Oct 21, 2024 12:46 PM Emylou G wrote: [facility] that prompted transfer to Nurse Triage: currently in the hospital now due to his back pain.SABRA they told him his muscle deteriorating.. they unsure if they will take him since they are full.. unsure what to do or how to bring him into be seen. Reason for Disposition  Health information question, no triage required and triager able to answer question  Answer Assessment - Initial Assessment Questions 1. REASON FOR CALL: What is the main reason for your call? or How can I best help you?    Patient is currently in the hospital with back pain. Paint's Fiance Olam is nervous about taking him home, says she is afraid he is going to fall. She is interested in home care. Olam not currently with patient. I asked if she had told the hospital staff this, she says no. I encouraged patient to express her concerns about safety and discharge to the hospital staff where patient is being treated. She said she would and was on her way there now. Told her to follow up with us  if she has any more concerns.  Protocols used: Information Only Call - No Triage-A-AH

## 2024-10-21 NOTE — ED Triage Notes (Signed)
 Complaining of back pain that is shooting down the right leg. Was admitted last week for back pain. Has followed up with neurosurgery and orthopedic and was given prednisone  and percocets which is not helping with the pain. Pt said that he can not get up to go to the bathroom or put any pressure on the back.

## 2024-10-21 NOTE — Discharge Instructions (Signed)
 Please call your neurosurgery team this week for follow-up.  We are trying to facilitate some sort of resolution to your back pain.  We were able to accomplish the physical therapy evaluation today which is a major step.  Your neurosurgery doctor can help move forward from here.  I have also called in some stronger pain medication to your pharmacy.  Please do not take this at the same time as your other pain medicines as the combination can affect your breathing. This medication does cause constipation.      Trance for patient to [facility] now

## 2024-10-21 NOTE — Telephone Encounter (Signed)
 Per chart patient went to ER and is still there as instructed below from yesterday's conversation, arrived there at 9: 10 am. FYI to Dr Clois

## 2024-10-21 NOTE — ED Notes (Signed)
 Pt requested ice pack for 5/10 right ankle pain. Provided pt ice pack.

## 2024-10-21 NOTE — ED Provider Notes (Signed)
 Emergency Department Provider Note   I have reviewed the triage vital signs and the nursing notes.   HISTORY  Chief Complaint Back Pain   HPI Aaron Clarke is a 68 y.o. 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  This has been an ongoing issue for the patient.  He had an admission in late November for back pain and acute kidney injury.  He has followed with neurosurgery and saw the orthopedic surgeon yesterday.  There was initial concern that his hip may be contributing to symptoms but that was thought to be less likely at the appointment yesterday.  Patient saw neurosurgery on 11/24 (Dr. Clois) with plan for PT and referral for spine injections.  Patient states that appointment is in January and has not been approved by insurance as of yet.  The patient and wife states that he is deteriorating significantly at home due to back pain.  He is not able to ambulate without significant assistance and appears to be generally weak. No fever. No falls.   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 Positive generalized weakness.  Cardiovascular: Denies chest pain. Respiratory: Denies shortness of breath. Gastrointestinal: No abdominal pain.  No nausea, no vomiting.   Musculoskeletal: Positive for back pain. Skin: Negative for rash. Neurological: Negative for headaches, focal weakness or numbness.  ____________________________________________   PHYSICAL EXAM:  VITAL SIGNS: ED Triage Vitals  Encounter Vitals Group     BP 10/21/24 0920 128/71     Pulse Rate  10/21/24 0920 72     Resp 10/21/24 0920 18     Temp 10/21/24 0920 98.8 F (37.1 C)     Temp Source 10/21/24 0920 Oral     SpO2 10/21/24 0920 94 %     Weight 10/21/24 0933 (!) 315 lb (142.9 kg)     Height 10/21/24 0933 6' (1.829 m)   Constitutional: Alert and oriented. Well appearing and in no acute distress. Eyes: Conjunctivae are normal.  Head: Atraumatic. Nose: No congestion/rhinnorhea. Mouth/Throat: Mucous membranes are moist.  Neck: No stridor.  Cardiovascular: Normal rate, regular rhythm. Good peripheral circulation. Grossly normal heart sounds.   Respiratory: Normal respiratory effort.  No retractions. Lungs CTAB. Gastrointestinal: Soft and nontender. No distention.  Musculoskeletal: No lower extremity tenderness nor edema. No gross deformities of extremities. Neurologic:  Normal speech and language. 4+/5 strength in the bilateral LEs.  Skin:  Skin is warm, dry and intact. No rash noted.  ____________________________________________   LABS (all labs ordered are listed, but only abnormal results are displayed)  Labs Reviewed  BASIC METABOLIC PANEL WITH GFR - Abnormal; Notable for the following components:      Result Value   CO2 20 (*)    Glucose, Bld 138 (*)    BUN 44 (*)    Creatinine, Ser 1.44 (*)    GFR, Estimated 54 (*)    All other components within normal limits  URINALYSIS, W/ REFLEX TO CULTURE (INFECTION SUSPECTED) - Abnormal; Notable for the following components:   Hgb urine dipstick SMALL (*)  All other components within normal limits  CBC WITH DIFFERENTIAL/PLATELET   ____________________________________________   PROCEDURES  Procedure(s) performed:   Procedures  none ____________________________________________   INITIAL IMPRESSION / ASSESSMENT AND PLAN / ED COURSE  Pertinent labs & imaging results that were available during my care of the patient were reviewed by me and considered in my medical decision making (see chart for details).    This patient is Presenting for Evaluation of back pain, which does require a range of treatment options, and is a complaint that involves a high risk of morbidity and mortality.  The Differential Diagnoses includes but is not exclusive to musculoskeletal back pain, renal colic, urinary tract infection, pyelonephritis, intra-abdominal causes of back pain, aortic aneurysm or dissection, cauda equina syndrome, sciatica, lumbar disc disease, thoracic disc disease, etc.   Critical Interventions-    Medications  sodium chloride  0.9 % bolus 500 mL (0 mLs Intravenous Stopped 10/21/24 1316)  HYDROmorphone  (DILAUDID ) injection 1 mg (1 mg Intravenous Given 10/21/24 1025)  ondansetron  (ZOFRAN ) injection 4 mg (4 mg Intravenous Given 10/21/24 1024)  HYDROmorphone  (DILAUDID ) tablet 2 mg (2 mg Oral Given 10/21/24 1314)    Reassessment after intervention: pain improved slightly.    I did obtain Additional Historical Information from family at bedside.  I decided to review pertinent External Data, and in summary notes from [facility]G and ortho reviewed.   Clinical Laboratory Tests Ordered, included UA without infection. No anemia or leukocytosis. Creatinine improved from prior.   Radiologic Tests: Considered repeat MRI imaging but no acute changes to exam. Defer for now.   Cardiac Monitor Tracing which shows NSR.    Social Determinants of Health Risk patient is not an active smoker.   Consult complete with Dr. Clois with neurosurgery.  We discussed the case.  No immediate surgical management indicated.  Patient may benefit from PT evaluation to see if he would even be a candidate for outpatient physical therapy and he can closely follow with NSG as an outpatient.   Medical Decision Making: Summary:  Patient presents emergency department with worsening back pain.  Sounds like he is doing poorly at home with plan for home PT, rollator, referral for spine injections.  Plan for screening blood work as he did  have an AKI during his last admit and pain management.  His most recent MRI of the lumbar spine was 11/19.   Reevaluation with update and discussion with patient. Discussed reassuring labs. Pain better controlled in the ED with PO Dilaudid  which I have sent to the pharmacy. Awaiting PT evaluation and TOC evaluation.   Patient's presentation is most consistent with severe exacerbation of chronic illness.   Disposition: pending   ____________________________________________  FINAL CLINICAL IMPRESSION(S) / ED DIAGNOSES  Final diagnoses:  Chronic midline low back pain without sciatica     NEW OUTPATIENT MEDICATIONS STARTED DURING THIS VISIT:  New Prescriptions   HYDROMORPHONE  (DILAUDID ) 2 MG TABLET    Take 1 tablet (2 mg total) by mouth every 6 (six) hours as needed for severe pain (pain score 7-10).   SENNA-DOCUSATE (SENOKOT-S) 8.6-50 MG TABLET    Take 1 tablet by mouth at bedtime as needed for mild constipation.    Note:  This document was prepared using Dragon voice recognition software and may include unintentional dictation errors.  Fonda Law, MD, [facility]    Mischelle Reeg, Fonda MATSU, MD 10/21/24 [phone]

## 2024-10-21 NOTE — Telephone Encounter (Signed)
 Patient's significant other is calling back to let our office know that the patient has taken two doses of the steroid pack with no relief. She states that he has previously has taken steroids with no relief. She has never seen him in this much pain and is concerned with him falling. Please advise.

## 2024-10-22 DIAGNOSIS — M545 Low back pain, unspecified: Secondary | ICD-10-CM | POA: Diagnosis not present

## 2024-10-22 MED ORDER — GABAPENTIN 300 MG PO CAPS
300.0000 mg | ORAL_CAPSULE | Freq: Three times a day (TID) | ORAL | Status: DC
  Administered 2024-10-22 – 2024-10-23 (×4): 300 mg via ORAL
  Filled 2024-10-22 (×4): qty 1

## 2024-10-22 MED ORDER — CELECOXIB 100 MG PO CAPS: 200.0000 mg | ORAL_CAPSULE | Freq: Two times a day (BID) | ORAL | Status: DC | PRN

## 2024-10-22 MED ORDER — BISOPROLOL FUMARATE 5 MG PO TABS
5.0000 mg | ORAL_TABLET | Freq: Every day | ORAL | Status: DC
  Administered 2024-10-22 – 2024-10-23 (×2): 5 mg via ORAL
  Filled 2024-10-22 (×3): qty 1

## 2024-10-22 MED ORDER — ROSUVASTATIN CALCIUM 10 MG PO TABS
10.0000 mg | ORAL_TABLET | Freq: Every day | ORAL | Status: DC
  Administered 2024-10-22 – 2024-10-23 (×2): 10 mg via ORAL
  Filled 2024-10-22 (×3): qty 1

## 2024-10-22 MED ORDER — ACETAMINOPHEN 500 MG PO TABS
1000.0000 mg | ORAL_TABLET | Freq: Three times a day (TID) | ORAL | Status: DC
  Administered 2024-10-22 – 2024-10-23 (×4): 1000 mg via ORAL
  Filled 2024-10-22 (×4): qty 2

## 2024-10-22 MED ORDER — APIXABAN 5 MG PO TABS
5.0000 mg | ORAL_TABLET | Freq: Two times a day (BID) | ORAL | Status: DC
  Administered 2024-10-22 – 2024-10-23 (×3): 5 mg via ORAL
  Filled 2024-10-22 (×3): qty 1

## 2024-10-22 MED ORDER — METHOCARBAMOL 500 MG PO TABS: 750.0000 mg | ORAL_TABLET | Freq: Three times a day (TID) | ORAL | Status: DC | PRN

## 2024-10-22 MED ORDER — ALBUTEROL SULFATE HFA 108 (90 BASE) MCG/ACT IN AERS: 1.0000 | INHALATION_SPRAY | Freq: Four times a day (QID) | RESPIRATORY_TRACT | Status: DC | PRN

## 2024-10-22 NOTE — ED Notes (Signed)
 Pt assisted to restroom.

## 2024-10-22 NOTE — NC FL2 (Signed)
 [place]  MEDICAID FL2 LEVEL OF CARE FORM     IDENTIFICATION  Patient Name: Aaron Clarke Birthdate: [birth year] Sex: male Admission Date (Current Location): 10/21/2024  [facility] and [place] Number:  [employer] and Address:  [facility],  [address] [place] [address]      Provider Number: [id number]  Attending Physician Name and Address:  [name], MD  Relative Name and Phone Number:       Current Level of Care: Hospital Recommended Level of Care: Skilled Nursing Facility Prior Approval Number:    Date Approved/Denied:   PASRR Number: [id number]  Discharge Plan: SNF    Current Diagnoses: Patient Active Problem List   Diagnosis Date Noted   Hyperlipidemia 10/09/2024   History of DVT (deep vein thrombosis) 10/09/2024   Physical deconditioning 10/09/2024   Pain of right hip 10/08/2024   Nocturnal hypoxia 09/28/2024   Nephrolithiasis 09/25/2024   Back pain with sciatica 09/25/2024   CKD (chronic kidney disease), stage III (HCC) 05/08/2024   History of pulmonary embolism 03/31/2024   Class 2 obesity 03/12/2024   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   GERD (gastroesophageal reflux disease) 03/12/2024   Asthmatic bronchitis , chronic (HCC) 01/02/2024   Physical exam, annual 12/17/2023   Morbid obesity (HCC) 12/17/2023   BMI 38.0-38.9,adult 12/17/2023   Pain in joint of right hip 11/07/2022   Dyspnea on exertion 10/24/2022   Elevated troponin 10/24/2022   Essential hypertension 10/18/2022   History of colonic polyps    Family history of colon cancer in father    Polyp of ascending colon    Cecal polyp    Knee arthropathy 09/04/2022   Screening for colon cancer 08/07/2022   Lumbago of lumbar region with sciatica 08/07/2022   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   Rosacea 05/20/2021   Cholecystitis with cholelithiasis 12/19/2011   Obesity, Class III, BMI 40-49.9 (morbid obesity) (HCC) 12/14/2011   Asthma 12/14/2011     Orientation RESPIRATION BLADDER Height & Weight     Self, Time, Situation, Place  Normal Continent Weight: (!) 315 lb (142.9 kg) Height:  6' (182.9 cm)  BEHAVIORAL SYMPTOMS/MOOD NEUROLOGICAL BOWEL NUTRITION STATUS      Continent Diet (Regular)  AMBULATORY STATUS COMMUNICATION OF NEEDS Skin   Extensive Assist Verbally Normal                       Personal Care Assistance Level of Assistance  Bathing, Feeding, Dressing Bathing Assistance: Limited assistance Feeding assistance: Independent Dressing Assistance: Limited assistance     Functional Limitations Info  Sight, Hearing, Speech Sight Info: Adequate Hearing Info: Adequate Speech Info: Adequate    SPECIAL CARE FACTORS FREQUENCY  PT (By licensed PT), OT (By licensed OT)     PT Frequency: 5x/wk OT Frequency: 5x/wk            Contractures Contractures Info: Not present    Additional Factors Info  Code Status, Allergies Code Status Info: FULL Allergies Info: No Known Allergies           Current Medications (10/22/2024):  This is the current hospital active medication list Current Facility-Administered Medications  Medication Dose Route Frequency Provider Last Rate Last Admin   HYDROmorphone  (DILAUDID ) tablet 2 mg  2 mg Oral Q4H PRN Melvenia Motto, MD   2 mg at 10/22/24 0902   lidocaine  (LIDODERM ) 5 % 1 patch  1 patch Transdermal Q24H Melvenia Motto, MD   1 patch at 10/21/24 1700  methocarbamol  (ROBAXIN ) tablet 1,000 mg  1,000 mg Oral QID Melvenia Motto, MD   1,000 mg at 10/22/24 0902   Current Outpatient Medications  Medication Sig Dispense Refill   acetaminophen  (TYLENOL ) 500 MG tablet Take 2 tablets (1,000 mg total) by mouth every 8 (eight) hours.     albuterol  (PROVENTIL ) (2.5 MG/3ML) 0.083% nebulizer solution Take 3 mLs (2.5 mg total) by nebulization every 6 (six) hours as needed for wheezing or shortness of breath. 75 mL 0   albuterol  (VENTOLIN  HFA) 108 (90 Base) MCG/ACT inhaler Inhale 1-2 puffs into the  lungs every 6 (six) hours as needed for wheezing or shortness of breath. 8 g 0   apixaban  (ELIQUIS ) 5 MG TABS tablet Take 1 tablet (5 mg total) by mouth 2 (two) times daily. 60 tablet 1   bisoprolol  (ZEBETA ) 5 MG tablet TAKE 1 TABLET BY MOUTH DAILY 100 tablet 2   budesonide -formoterol  (SYMBICORT ) 160-4.5 MCG/ACT inhaler USE 2 INHALATIONS BY MOUTH FIRST THING IN THE MORNING AND THEN  ANOTHER 2 INHALATIONS AFTER  ABOUT 12 HOURS LATER (Patient taking differently: Inhale 2 puffs into the lungs in the morning and at bedtime. USE 2 INHALATIONS BY MOUTH FIRST THING IN THE MORNING AND THEN  ANOTHER 2 INHALATIONS AFTER  ABOUT 12 HOURS LATER) 30.6 g 3   celecoxib  (CELEBREX ) 200 MG capsule TAKE 1 CAPSULE (200 MG TOTAL) BY MOUTH 2 (TWO) TIMES DAILY BETWEEN MEALS AS NEEDED. (Patient taking differently: Take 200 mg by mouth 2 (two) times daily between meals as needed for mild pain (pain score 1-3).) 60 capsule 1   gabapentin  (NEURONTIN ) 300 MG capsule Take 1 capsule (300 mg total) by mouth 3 (three) times daily. 90 capsule 1   HYDROmorphone  (DILAUDID ) 2 MG tablet Take 1 tablet (2 mg total) by mouth every 6 (six) hours as needed for severe pain (pain score 7-10). 12 tablet 0   lidocaine  (LIDODERM ) 5 % Place 1 patch onto the skin daily.     losartan  (COZAAR ) 25 MG tablet Take 1 tablet (25 mg total) by mouth daily. 30 tablet 3   methocarbamol  (ROBAXIN ) 500 MG tablet Take 1.5 tablets (750 mg total) by mouth every 8 (eight) hours as needed for muscle spasms. 90 tablet 0   methylPREDNISolone  (MEDROL  DOSEPAK) 4 MG TBPK tablet Take by mouth as directed for 6 days. (Patient taking differently: Take 4 mg by mouth as directed.) 21 tablet 0   polyethylene glycol (MIRALAX  / GLYCOLAX ) 17 g packet Take 17 g by mouth daily. (Patient taking differently: Take 17 g by mouth daily as needed for mild constipation.) 28 each 1   rosuvastatin  (CRESTOR ) 10 MG tablet Take 1 tablet (10 mg total) by mouth daily. 90 tablet 2   senna-docusate  (SENOKOT-S) 8.6-50 MG tablet Take 1 tablet by mouth at bedtime as needed for mild constipation. 20 tablet 0     Discharge Medications: Please see discharge summary for a list of discharge medications.  Relevant Imaging Results:  Relevant Lab Results:   Additional Information SSN: [id number]  Hoy DELENA Bigness, LCSW

## 2024-10-22 NOTE — ED Notes (Addendum)
 CSW met with pt at bedside and spoke with fiance, Olam via t/c to confirm plan for SNF placement. Pt has not been to SNF in the past. Pt's 1st choice for placement is at [facility] and 2nd choice is [facility]. Referrals have been faxed out and currently awaiting bed offer. Auth to be started once PT note has been entered.   UPDATE: Pt has accepted bed offer for [facility]. Insurance auth requested and currently pending.

## 2024-10-22 NOTE — ED Provider Notes (Signed)
 Emergency Medicine Observation Re-evaluation Note  Aaron Clarke is a 68 y.o. male, seen on rounds today.  Pt initially presented to the ED for complaints of Back Pain Currently, the patient is not having any new complaints.  Physical Exam  BP 113/71   Pulse 66   Temp 98.6 F (37 C)   Resp 17   Ht 6' (1.829 m)   Wt (!) 142.9 kg   SpO2 92%   BMI 42.72 kg/m  Physical Exam General: Resting comfortably in stretcher Lungs: Normal work of breathing Psych: Calm  ED Course / MDM  EKG:   I have reviewed the labs performed to date as well as medications administered while in observation.  Recent changes in the last 24 hours include seen and medically cleared.  Plan  Current plan is for PT eval.  Continue treating pain of his acute on chronic back pain.    Aaron Lamar BROCKS, MD 10/22/24 [phone]

## 2024-10-22 NOTE — ED Notes (Signed)
 Pt back in bed with assistance

## 2024-10-22 NOTE — ED Notes (Signed)
 Pt in chair from PT

## 2024-10-22 NOTE — ED Notes (Signed)
 Pt went to the bathroom with assistance by wheelchair

## 2024-10-22 NOTE — Plan of Care (Signed)
  Problem: Acute Rehab PT Goals(only PT should resolve) Goal: Pt Will Go Supine/Side To Sit Outcome: Progressing Flowsheets (Taken 10/22/2024 1217) Pt will go Supine/Side to Sit:  with supervision  with modified independence Note: Reduced pain Goal: Pt Will Go Sit To Supine/Side Outcome: Progressing Flowsheets (Taken 10/22/2024 1217) Pt will go Sit to Supine/Side:  with modified independence  with supervision Note: Reduced pain Goal: Patient Will Perform Sitting Balance Outcome: Progressing Flowsheets (Taken 10/22/2024 1217) Patient will perform sitting balance: with supervision Note: Reduced pain Goal: Patient Will Transfer Sit To/From Stand Outcome: Progressing Flowsheets (Taken 10/22/2024 1217) Patient will transfer sit to/from stand:  with modified independence  with supervision Note: Reduced pain Goal: Pt Will Transfer Bed To Chair/Chair To Bed Outcome: Progressing Flowsheets (Taken 10/22/2024 1217) Pt will Transfer Bed to Chair/Chair to Bed: with supervision Note: Reduced pain Goal: Pt Will Perform Standing Balance Or Pre-Gait Outcome: Progressing Flowsheets (Taken 10/22/2024 1217) Pt will perform standing balance or pre-gait:  with Supervision  with contact guard assist  with no UE support Note: Reduced pain Goal: Pt Will Ambulate Outcome: Progressing Flowsheets (Taken 10/22/2024 1217) Pt will Ambulate:  25 feet  15 feet  with rolling walker  with contact guard assist  with supervision Note: Reduced pain   Jaiyden Laur, SPT

## 2024-10-22 NOTE — Evaluation (Signed)
 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  He had a fall 1 week ago while trying to get into his truck.  He owns and runs a dealer.  Denies any trauma or twisting to his back or hips.  The pain has kept him up at night as he can't find a comfortable position in bed.  He presents to the ER to get a solution to his pain so he can return to working as usual.    Clinical Impression  Pt. Presented w/ general weakness, LBP and radiating pain on R LE pt pain due to dx. Pt pain was monitored throughout the session. Pt. was able to perform bed mobility to EOB w/ min A/ mod A, pt attempted to reposition themselves to reduce pain, however pain increased. Pt. was able to perform sit to stand transfer and bed to chair w/ min A/ mod A w/ RW to support pt standing balance at bedside. Pt. Was bale to side step ambulate toward chair w/ RW and min A/ mod A. Towards the end of session pt was expressing 10/10 pain, pt. Was left in chair w/pillows for comfort, and call bell at bedside. Nursing staff was notified on pt. Status and when to return him to bed. Patient will benefit from continued skilled physical therapy in hospital and recommended venue below to increase strength, balance, endurance for safe ADLs and gait.       If plan is discharge home, recommend the following: A lot of help with walking and/or transfers;Two people to help with  bathing/dressing/bathroom;Assistance with cooking/housework;Assistance with feeding;Help with stairs or ramp for entrance;Assist for transportation   Can travel by private vehicle   Yes    Equipment Recommendations    Recommendations for Other Services       Functional Status Assessment Patient has had a recent decline in their functional status and demonstrates the ability to make significant improvements in function in a reasonable and predictable amount of time.     Precautions / Restrictions Precautions Precautions: Fall Recall of Precautions/Restrictions: Intact Restrictions Weight Bearing Restrictions Per Provider Order: No      Mobility  Bed Mobility Overal bed mobility: Needs Assistance Bed Mobility: Supine to Sit     Supine to sit: Min assist, Mod assist (due to pain)     General bed mobility comments: HOB elevated    Transfers Overall transfer level: Needs assistance Equipment used: Rolling walker (2 wheels) Transfers: Sit to/from Stand, Bed to chair/wheelchair/BSC Sit to Stand: Min assist, Mod assist   Step pivot transfers: Contact guard assist, Min assist       General transfer comment: pt. had diffuclty w/ tranfer due to pain in hte RLE and back region, repositioning increased sx    Ambulation/Gait Ambulation/Gait assistance: Min assist, Mod assist Gait Distance (Feet): 5 Feet Assistive device: Rolling walker (2 wheels) Gait Pattern/deviations: Decreased step length - right, Step-to pattern, Decreased step length - left, Decreased stance time - right, Decreased stance time - left, Antalgic, Trunk flexed,  Narrow base of support Gait velocity: decreased     General Gait Details: labored side step ambulation due pain in the RLE and back, pt used RW assist w/ balance used to help adjust back position to supress pain, during ambulation.  Stairs            Wheelchair Mobility     Tilt Bed    Modified Rankin (Stroke Patients Only)        Balance Overall balance assessment: Needs assistance Sitting-balance support: Feet supported, No upper extremity supported Sitting balance-Leahy Scale: Fair Sitting balance - Comments: seated at EOB   Standing balance support: No upper extremity supported, During functional activity Standing balance-Leahy Scale: Poor Standing balance comment: poor/fair  w/RW                             Pertinent Vitals/Pain Pain Assessment Pain Assessment: Faces Pain Score: 10-Worst pain ever Faces Pain Scale: Hurts worst Pain Location: R. LE, R Groin, and Back Pain Descriptors / Indicators: Radiating, Sharp Pain Intervention(s): Limited activity within patient's tolerance, Monitored during session, Repositioned    Home Living Family/patient expects to be discharged to:: Private residence Living Arrangements: Spouse/significant other Available Help at Discharge: Family;Available 24 hours/day Type of Home: House Home Access: Stairs to enter Entrance Stairs-Rails: Can reach both Entrance Stairs-Number of Steps: 6   Home Layout: One level Home Equipment: Cane - single Librarian, Academic (2 wheels) Additional Comments: uses RW when going to bathroom    Prior Function Prior Level of Function : Working/employed;Driving;Needs assist;History of Falls (last six months)       Physical Assist : ADLs (physical);Mobility (physical) Mobility (physical): Bed mobility;Transfers;Gait;Stairs ADLs (physical): Dressing Mobility Comments: Household ambulation w/ RW only due to pain ADLs Comments: assistance w/ dressing, bathing when needed due to pain     Extremity/Trunk Assessment        Lower Extremity Assessment Lower Extremity Assessment: Generalized weakness    Cervical / Trunk Assessment Cervical / Trunk Assessment: Kyphotic  Communication   Communication Communication: No apparent difficulties    Cognition Arousal: Alert Behavior During Therapy: WFL for tasks  assessed/performed   PT - Cognitive impairments: No apparent impairments                         Following commands: Intact       Cueing Cueing Techniques: Verbal cues, Tactile cues     General Comments      Exercises     Assessment/Plan    PT Assessment Patient needs continued PT services  PT Problem List Decreased strength;Decreased activity tolerance;Decreased balance;Decreased mobility;Pain       PT Treatment Interventions DME instruction;Gait training;Stair training;Functional mobility training;Therapeutic activities;Therapeutic exercise;Balance training;Patient/family education    PT Goals (Current goals can be found in the Care Plan section)  Acute Rehab PT Goals Patient Stated Goal: return home with family to assist PT Goal Formulation: With patient Time For Goal Achievement: 10/30/24 Potential to Achieve Goals: Good    Frequency Min 3X/week     Co-evaluation               AM-PAC PT 6 Clicks Mobility  Outcome Measure Help needed turning from your back to your side while in a flat bed without using bedrails?: A Little Help needed moving from lying on your back to sitting on the side of a flat bed without using bedrails?: A Little Help needed moving  to and from a bed to a chair (including a wheelchair)?: A Little Help needed standing up from a chair using your arms (e.g., wheelchair or bedside chair)?: A Lot Help needed to walk in hospital room?: A Lot Help needed climbing 3-5 steps with a railing? : A Lot 6 Click Score: 15    End of Session Equipment Utilized During Treatment: Gait belt Activity Tolerance: Patient tolerated treatment well;Patient limited by pain Patient left: in chair;with call bell/phone within reach Nurse Communication: Mobility status PT Visit Diagnosis: Unsteadiness on feet (R26.81);Other abnormalities of gait and mobility (R26.89);Muscle weakness (generalized) (M62.81)    Time: 0939-1000 PT Time Calculation (min)  (ACUTE ONLY): 21 min   Charges:   PT Evaluation $PT Eval Moderate Complexity: 1 Mod PT Treatments $Therapeutic Activity: 8-22 mins PT General Charges $$ ACUTE PT VISIT: 1 Visit        Aristides Luckey, SPT

## 2024-10-22 NOTE — ED Notes (Signed)
 Pt helped from bathroom, back into bed.

## 2024-10-23 ENCOUNTER — Telehealth: Payer: Self-pay

## 2024-10-23 ENCOUNTER — Ambulatory Visit: Admitting: Family Medicine

## 2024-10-23 DIAGNOSIS — M545 Low back pain, unspecified: Secondary | ICD-10-CM | POA: Diagnosis not present

## 2024-10-23 MED ORDER — FLUTICASONE FUROATE-VILANTEROL 200-25 MCG/ACT IN AEPB
1.0000 | INHALATION_SPRAY | Freq: Every day | RESPIRATORY_TRACT | Status: DC
  Administered 2024-10-23: 1 via RESPIRATORY_TRACT
  Filled 2024-10-23: qty 28

## 2024-10-23 MED ORDER — HYDROMORPHONE HCL 2 MG PO TABS: 4.0000 mg | ORAL_TABLET | Freq: Four times a day (QID) | ORAL | 0 refills | Status: DC | PRN

## 2024-10-23 MED ORDER — LOSARTAN POTASSIUM 25 MG PO TABS
25.0000 mg | ORAL_TABLET | Freq: Every day | ORAL | Status: DC
  Administered 2024-10-23: 25 mg via ORAL
  Filled 2024-10-23: qty 1

## 2024-10-23 NOTE — ED Notes (Signed)
 This nurse called at bedside, pt informed that his wife said that the insurance company said they need to expedite his referral to the facility his going to see he can get approved ASAP and we can get him out of here immediately. Case worker [employer] notified. Case worker informed this nurse that there is no way to expedite process. Patient update.

## 2024-10-23 NOTE — ED Notes (Signed)
 Pt's insurance has been approved for SNF placement. Pt will be going to [facility]. RN to call report to [phone]. Spoke with pt's daughter to inform of discharge. Pt will need signed script for hydromorphone  in DC packet. RN and MD notified.

## 2024-10-23 NOTE — ED Provider Notes (Signed)
 Patient to go to [facility] today   Suzette Pac, MD 10/23/24 [phone]

## 2024-10-23 NOTE — Telephone Encounter (Signed)
 Copied from CRM [id number]. Topic: General - Other >> Oct 23, 2024  8:46 AM Aaron Clarke wrote: Reason for CRM: Patient wants PCP to know that his back has gotten worse, and that he is going into [facility] in [place] for PT. Patient is currently admitted in the hospital. States he may end up needing surgery and wanted PCP to know.

## 2024-10-23 NOTE — ED Notes (Addendum)
 Pt wife is upset and stated Pt was in pain all night and did not receive his night time meds until late morning. Pt wife was educated that pt can ONLY receive pain meds every 4 hours. Last dose was given at 4:48am and pt had to wait until 9am. Pt appears comfortable at this time and denies pain at this time.

## 2024-10-23 NOTE — ED Notes (Signed)
 Spoke with pt's daughter, Harlene via t/c. Pt's daughter has multiple complaints regarding pt's stay in the emergency department, his pain level/medication administration, and location. CSW encouraged pt's daughter to speak with pt's nurse to discuss these concerns.  Pt's daughter shares she spoke with pt's insurance this morning and was told his stay for SNF had been approved. CSW called [facility] and was informed pt's shara is still pending and has not yet been reviewed. [facility] also shares that pt's insurance does not have access to their system and they would not know at this point whether or not SNF had been approved. Pt's daughter has been updated and provided with patient experience contact information.

## 2024-10-23 NOTE — ED Notes (Signed)
Report called to [facility]. ?

## 2024-10-23 NOTE — ED Notes (Signed)
 Insurance auth for SNF still pending. Will continue to check throughout day for approval.

## 2024-10-23 NOTE — ED Notes (Signed)
 [facility] called to transport patient to [facility]. Nurse made aware.

## 2024-10-23 NOTE — ED Provider Notes (Signed)
 Emergency Medicine Observation Re-evaluation Note  Aaron Clarke is a 68 y.o. male, seen on rounds today.  Pt initially presented to the ED for complaints of Back Pain Currently, the patient is asleep.  Physical Exam  BP 125/78 (BP Location: Left Arm)   Pulse 65   Temp 97.7 F (36.5 C) (Oral)   Resp 18   Ht 6' (1.829 m)   Wt (!) 142.9 kg   SpO2 95%   BMI 42.72 kg/m  Physical Exam General: Asleep Cardiac: Asleep Lungs: Asleep Psych: Asleep  ED Course / MDM  EKG:   I have reviewed the labs performed to date as well as medications administered while in observation.  Recent changes in the last 24 hours include home meds being ordered.  Plan  Current plan is for placement.    Freddi Hamilton, MD 10/23/24 [phone]

## 2024-10-24 NOTE — Telephone Encounter (Signed)
 Copied from CRM [id number]. Topic: Clinical - Home Health Verbal Orders >> Oct 24, 2024 12:48 PM Emylou G wrote: Caller/Agency: Jasmine w/[facility] Callback Number: [phone] secured line Service Requested: Physical Therapy Frequency: the start of care visit is delayed to 12/6 they are going tomorrow Any new concerns about the patient? No

## 2024-10-24 NOTE — Telephone Encounter (Signed)
 Copied from CRM [id number]. Topic: Clinical - Order For Equipment >> Oct 24, 2024  9:45 AM Wess RAMAN wrote: Reason for CRM: Patient's fiance, Olam, would like Kayla, Hospital Doctor to order a hospital bed and hoyer lift so that she can bring him home.  Callback #: [phone]

## 2024-10-27 ENCOUNTER — Telehealth: Payer: Self-pay | Admitting: Family Medicine

## 2024-10-27 ENCOUNTER — Encounter: Payer: Self-pay | Admitting: Family Medicine

## 2024-10-27 NOTE — Telephone Encounter (Signed)
 Copied from CRM #[id number]. Topic: Clinical - Home Health Verbal Orders >> Oct 27, 2024  9:55 AM Nathanel BROCKS wrote: Caller/Agency: Odis [facility]ella Callback Number: [phone] Service Requested: Physical Therapy Frequency: 1 week 1 2 week 1 1 week 2 Any new concerns about the patient? No Pt is taking Dilaudid  by 2 different drs

## 2024-10-28 ENCOUNTER — Telehealth: Payer: Self-pay | Admitting: Neurosurgery

## 2024-10-28 ENCOUNTER — Telehealth: Payer: Self-pay

## 2024-10-28 ENCOUNTER — Other Ambulatory Visit: Payer: Self-pay | Admitting: Family Medicine

## 2024-10-28 MED ORDER — GABAPENTIN 300 MG PO CAPS: 300.0000 mg | ORAL_CAPSULE | Freq: Four times a day (QID) | ORAL | 0 refills | Status: AC

## 2024-10-28 MED ORDER — HYDROMORPHONE HCL 2 MG PO TABS: 2.0000 mg | ORAL_TABLET | Freq: Four times a day (QID) | ORAL | 0 refills | Status: DC | PRN

## 2024-10-28 NOTE — Telephone Encounter (Signed)
 Copied from CRM #[id number]. Topic: Clinical - Medication Question >> Oct 28, 2024 10:43 AM Dedra B wrote: Reason for CRM: Pt called to follow up on previous request to have HYDROmorphone  (DILAUDID ) 2 MG tablet refilled.

## 2024-10-28 NOTE — Telephone Encounter (Signed)
 Called [facility] and was advised there was still 1 refill left on file from original RX. I spoke with Lisa-she said that Dr Clois during his last visit advised patient to increase his Gabapentin  dose 1 tablet at a time every few days. He is now taking Gabapentin  300 mg 4 capsules three times daily. They need a new RX with updated directions to be sent to the pharmacy. This dose regimen was approved and agreed by the ER doctors from patient's recent visit on 10/23/2024.  Olam also stated that patient was sent to [facility] after recent ER visit/admission but patient only stayed there for a day as no one was taking care of him or helping him getting therapy. Patient is at home now. [facility] has started to come out to see patient and OT. I advised Olam we do not have any records on this and asked them to send us  this information to see what has been done and their advise. Olam stated that patient's back is not doing well, he is hurting and needing surgery per [facility] PT/OT, they could barely do any movement with the patient. She kept saying he needs surgery. I advised we can review the notes once we have them but I would send this message for medication to the provider and will let Dr Clois know also.

## 2024-10-28 NOTE — Telephone Encounter (Signed)
 Patient's spouse is calling for a refill of gabapentin .  She states this was given to the patient in the hospital but was told that Dr. Clois would be refilling when needed.

## 2024-10-28 NOTE — Telephone Encounter (Signed)
 Neurontin  prescription changed to 300mg  1 qid and sent to pharmacy.   Can review HHPT notes once we get them to see if we need to move his appointment up. Is he having any new weakness?

## 2024-10-29 ENCOUNTER — Other Ambulatory Visit: Payer: Self-pay

## 2024-10-29 ENCOUNTER — Telehealth: Payer: Self-pay

## 2024-10-29 DIAGNOSIS — N200 Calculus of kidney: Secondary | ICD-10-CM

## 2024-10-29 NOTE — Telephone Encounter (Signed)
 I spoke to patient and informed him of the change in the medication. He states he is not having any new problems or weakness just PT is not helping.

## 2024-10-30 ENCOUNTER — Ambulatory Visit: Admitting: Family Medicine

## 2024-10-30 ENCOUNTER — Encounter: Payer: Self-pay | Admitting: Family Medicine

## 2024-10-30 VITALS — BP 119/74 | HR 75 | Temp 98.6°F | Ht 72.0 in | Wt 280.0 lb

## 2024-10-30 DIAGNOSIS — I10 Essential (primary) hypertension: Secondary | ICD-10-CM

## 2024-10-30 DIAGNOSIS — N1831 Chronic kidney disease, stage 3a: Secondary | ICD-10-CM

## 2024-10-30 DIAGNOSIS — N200 Calculus of kidney: Secondary | ICD-10-CM

## 2024-10-30 DIAGNOSIS — Z96641 Presence of right artificial hip joint: Secondary | ICD-10-CM

## 2024-10-30 DIAGNOSIS — M5440 Lumbago with sciatica, unspecified side: Secondary | ICD-10-CM

## 2024-10-30 NOTE — Progress Notes (Signed)
 Acute Office Visit  Patient ID: Aaron Clarke, male    DOB: August 30, 1956, 68 y.o.   MRN: [id number]  PCP: Kayla Aaron RAMAN, FNP  Chief Complaint  Patient presents with   Medical Management of Chronic Issues     Subjective:     HPI Aaron Clarke is here today for hospital follow up. He was admitted from 10/21/2024-10/23/2024 for severe back pain. Was hospitalized for similar from November 19-20 and has had multiple ED visits in October and November for right low back, right groin, and right hip pain with lower extremity edema, multiple falls, and overall deconditioning. He had a total hip replacement earlier this year in April and subsequently suffered a DVT/PE. He has been suffering from back pain for some time now and has been evaluated by Neurosurgery with plans for spinal injections and PT. During his hospitalization he was treated with PRN Dilaudid  which did improve his pain. He was DC from hospital to SNF on 10/23/2024. 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 Previously evaluated by Neurosurgery who recommended PT and spinal injections. He did see orthopedics on 12/1 who did not feel his pain was coming from his hip and also recommended proceeding with treatment of his lumbar spine radiculopathy. Is awaiting insurance approval for [facility] treatment. Currently being managed at home with Dilaudid  daily or several times weekly.  HPI 10/21/2024 for reference only: HPI Aaron Clarke is a 68 y.o. 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  This has been an ongoing issue for the patient.  He had an admission in late November for back pain and acute kidney injury.  He has followed with neurosurgery and saw the orthopedic surgeon yesterday.  There was initial concern that his hip may be contributing to symptoms but that  was thought to be less likely at the appointment yesterday.  Patient saw neurosurgery on 11/24 (Dr. Clois) with plan for PT and referral for spine injections.  Patient states that appointment is in January and has not been approved by insurance as of yet.  The patient and wife states that he is deteriorating significantly at home due to back pain.  He is not able to ambulate without significant assistance and appears to be generally weak. No fever. No falls.    Discussed the use of AI scribe software for clinical note transcription with the patient, who gave verbal consent to proceed.  History of Present Illness Aaron Clarke is a 68 year old male with herniated discs who presents with ongoing leg pain and management of pain medications.  He experiences significant leg pain due to herniated discs in his lower back, diagnosed by neurology. The pain radiates from the right groin down the lateral side of the leg, stopping before the toes, and sometimes affects the right buttock. His back does not hurt much. He uses a walker for mobility and has not experienced any falls since being at home, though he had one fall in the last year.  He underwent a hip replacement in April and was doing well until October. He is awaiting spinal injections and has started physical therapy twice a week at home. He briefly stayed overnight at a skilled nursing facility for rehabilitation but found it unsuitable and returned home.  For pain management, he uses Dilaudid  sparingly, not more than once a day, and sometimes goes a day  or two without it. He also uses Tylenol , gabapentin , and lidocaine  patches, which he finds helpful. He avoids Celebrex  and other NSAIDs due to kidney concerns. He uses a heating blanket and recliner to manage pain when Tylenol  is insufficient. He uses Miralax  as needed for constipation due to Dilaudid  use.  He has a history of kidney disease and kidney stones, with a past acute kidney injury.  His kidney function has improved since a previous hospitalization in November. He has lost weight, now weighing 280 pounds, primarily by eating steak and salads. His blood pressure is stable, and he is no longer taking hydrochlorothiazide  due to low blood pressure episodes.  He has several upcoming appointments, including with orthopedics, neurosurgery, pulmonology, and urology, and a sleep study scheduled.   Review of Systems  All other systems reviewed and are negative.   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   Family history of breast cancer    mother   Hypertension    Osteoarthritis of right hip    PE (pulmonary thromboembolism) (HCC) 10/23/2022    Past Surgical History:  Procedure Laterality Date   CHOLECYSTECTOMY  12/19/2011   Procedure: LAPAROSCOPIC CHOLECYSTECTOMY WITH INTRAOPERATIVE CHOLANGIOGRAM;  Surgeon: Elon CHRISTELLA Pacini, MD;  Location: [facility] OR;  Service: General;  Laterality: N/A;   COLONOSCOPY WITH PROPOFOL  N/A 09/13/2022   Procedure: COLONOSCOPY WITH PROPOFOL ;  Surgeon: Unk Corinn Skiff, MD;  Location: [facility] ENDOSCOPY;  Service: Gastroenterology;  Laterality: N/A;   Colonscopy     Fatty tumor  2003   L chest   LEFT HEART CATH AND CORONARY ANGIOGRAPHY N/A 08/01/2024   Procedure: LEFT HEART CATH AND CORONARY ANGIOGRAPHY;  Surgeon: Anner Alm ORN, MD;  Location: [facility] INVASIVE CV LAB;  Service: Cardiovascular;  Laterality: N/A;   TOTAL HIP ARTHROPLASTY Right 02/19/2024   Procedure: ARTHROPLASTY, HIP, TOTAL, ANTERIOR APPROACH;  Surgeon: Vernetta Lonni GRADE, MD;  Location: [facility] OR;  Service: Orthopedics;  Laterality: Right;    Medications Ordered Prior to Encounter[1]  Allergies[2]     Objective:    BP 119/74   Pulse 75   Temp 98.6 F (37 C)   Ht 6' (1.829 m)   Wt 280 lb (127 kg)   SpO2 96%   BMI 37.97 kg/m    Physical  Exam Vitals and nursing note reviewed.  Constitutional:      Appearance: Normal appearance. He is normal weight.  HENT:     Head: Normocephalic and atraumatic.  Musculoskeletal:     Lumbar back: Normal. No spasms, tenderness or bony tenderness. Negative right straight leg raise test and negative left straight leg raise test.  Skin:    General: Skin is warm and dry.     Capillary Refill: Capillary refill takes less than 2 seconds.  Neurological:     General: No focal deficit present.     Mental Status: He is alert and oriented to person, place, and time. Mental status is at baseline.  Psychiatric:        Mood and Affect: Mood normal.        Behavior: Behavior normal.        Thought Content: Thought content normal.        Judgment: Judgment normal.       No results found for any visits on 10/30/24.     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 Neurology recommended spinal injections. Surgery considered if conservative measures fail. - Continue physical therapy twice a week. - Use Tylenol , heat, and lidocaine  patches for pain management. - Avoid Celebrex  and ibuprofen due to kidney concerns. - Use Dilaudid  sparingly, monitor for drowsiness. - Monitor bowel movements, use Miralax  if constipation occurs. - Follow up with neurosurgery on January 20th.  Chronic kidney disease stage III with nephrolithiasis and history of acute kidney injury Chronic kidney disease stage III with  nephrolithiasis. Improved kidney function post-acute episode. NSAIDs contraindicated. - Avoid NSAIDs such as Celebrex  and ibuprofen.  Status post total replacement of right hip Right hip replacement in April. Orthopedics confirmed hip is functioning well, not contributing to pain. - Continue follow-up with orthopedics as scheduled.  Hypertension Well-controlled with current medication. Hydrochlorothiazide  discontinued due to low blood pressure. - Continue current antihypertensive regimen without hydrochlorothiazide . - Monitor blood pressure regularly.  Morbid obesity, BMI 40-49.9 Morbid obesity with recent weight loss to 280 lbs. Ongoing weight management with dietary changes. - Continue current dietary regimen to support weight loss.    No orders of the defined types were placed in this encounter.   Return in about 3 months (around 01/28/2025) for chronic follow-up with labs 1 week prior.  Aaron GORMAN Barrio, FNP [facility]       [1]  Current Outpatient Medications on File Prior to Visit  Medication Sig Dispense Refill   acetaminophen  (TYLENOL ) 500 MG tablet Take 2 tablets (1,000 mg total) by mouth every 8 (eight) hours.     albuterol  (PROVENTIL ) (2.5 MG/3ML) 0.083% nebulizer solution Take 3 mLs (2.5 mg total) by nebulization every 6 (six) hours as needed for wheezing or shortness of breath. 75 mL 0   albuterol  (VENTOLIN  HFA) 108 (90 Base) MCG/ACT inhaler Inhale 1-2 puffs into the lungs every 6 (six) hours as needed for wheezing or shortness of breath. 8 g 0   apixaban  (ELIQUIS ) 5 MG TABS tablet Take 1 tablet (5 mg total) by mouth 2 (two) times daily. 60 tablet 1   bisoprolol  (ZEBETA ) 5 MG tablet TAKE 1 TABLET BY MOUTH DAILY 100 tablet 2   budesonide -formoterol  (SYMBICORT ) 160-4.5 MCG/ACT inhaler USE 2 INHALATIONS BY MOUTH FIRST THING IN THE MORNING AND THEN  ANOTHER 2 INHALATIONS AFTER  ABOUT 12 HOURS LATER (Patient taking differently: Inhale 2 puffs  into the lungs in the morning and at bedtime. USE 2 INHALATIONS BY MOUTH FIRST THING IN THE MORNING AND THEN  ANOTHER 2 INHALATIONS AFTER  ABOUT 12 HOURS LATER) 30.6 g 3   cetirizine (ZYRTEC) 10 MG tablet Take 10 mg by mouth daily.     gabapentin  (NEURONTIN ) 300 MG capsule Take 1 capsule (300 mg total) by mouth 4 (four) times daily. 120 capsule 0   HYDROmorphone  (DILAUDID ) 2 MG tablet Take 1 tablet (2 mg total) by mouth every 6 (six) hours as needed for severe pain (pain score  7-10). 90 tablet 0   lidocaine  (LIDODERM ) 5 % Place 1 patch onto the skin daily.     losartan  (COZAAR ) 25 MG tablet Take 1 tablet (25 mg total) by mouth daily. 30 tablet 3   methocarbamol  (ROBAXIN ) 500 MG tablet Take 1.5 tablets (750 mg total) by mouth every 8 (eight) hours as needed for muscle spasms. 90 tablet 0   polyethylene glycol (MIRALAX  / GLYCOLAX ) 17 g packet Take 17 g by mouth daily. (Patient taking differently: Take 17 g by mouth daily as needed for mild constipation.) 28 each 1   rosuvastatin  (CRESTOR ) 10 MG tablet Take 1 tablet (10 mg total) by mouth daily. 90 tablet 2   senna-docusate (SENOKOT-S) 8.6-50 MG tablet Take 1 tablet by mouth at bedtime as needed for mild constipation. 20 tablet 0   No current facility-administered medications on file prior to visit.  [2] No Known Allergies

## 2024-11-03 ENCOUNTER — Telehealth: Payer: Self-pay

## 2024-11-03 NOTE — Telephone Encounter (Signed)
 Copied from CRM #[id number]. Topic: Clinical - Medical Advice >> Nov 03, 2024  3:10 PM Dedra B wrote: Reason for CRM: Odis, PT from [facility], wants to speak with Jeoffrey Barrio regarding pt's her thoughts on pt's back pain, him following up with a neurosurgeon, and other issues. Pls call Ben at [phone]

## 2024-11-03 NOTE — Addendum Note (Signed)
 Addended by: KAYLA JEOFFREY RAMAN on: 11/03/2024 02:59 PM   Modules accepted: Level of Service

## 2024-11-04 ENCOUNTER — Telehealth: Payer: Self-pay

## 2024-11-04 NOTE — Telephone Encounter (Signed)
 [facility] faxed in Physician orders for Provider Amber howard. To review and Sign.   Orders reviewed and Signed on 11/04/2024  Frequency / duration of Visits: PT 1 WK1, 2 WK 1, 1 WK 2   oRDERS OF DISCIPLINE AND TREATMENTS:   PHYSICAL THERAPIST WILL ASSESS AND INSTRUCT ON ANY CHANGES IN COEXISTING AND OR RECURRING CONDITIONS SUCH AS COPD HLD CKD STAGE 3 OBESITY AND HTN, FACTORS THAT IMPACT ACTIVITY  AND PARTICIPATION , DEVELOP PHYSICAL THERAPY PLAN OF CARE IN CONSULTATION WITH THE PHYSICIAN THAT INCLUDES INFECTION PREVENTION CONTROL , DEPRESSION , HEALTH PROMOTION, HOME SAFETY,  NUTRITIONAL  NEEDS, IDENTIFICATION OF NEW OR CHANGED MEDICATIONS AN HOME INSTRUCTIONS TO FACILITATE TIMELY DISCHARGE, MAY RESUME CARE IF PATIENT IS TRANSFERRED TO AN INPATIENT FACILITY AND SUBSEQUENTLY DISCHARGED DURING THE EPISODE OF CARE. MAY D/C FROM AGENCY / SKILLED DISCIPLINE FOR GOALS ME, NO FURTHER SKILLS, NO LONGER HOMEBOUND ,PER CT/ PCG/MD REQUEST, UNSAFE HOME ENVIRONMENT, MOVED OUT OF SERVICE AREA OR DISCHARGED TO HOSPICE SERVICE.   THE NEW SPECIFIED SOC/ROC DATE IS 10/25/2024 AND HAS BEEN AGREED TO BY THE PHYSICIAN  ORDERS FAXED TO [facility] ON 11/04/2024 TO  PHONE [phone] FAX [phone]

## 2024-11-11 ENCOUNTER — Telehealth: Payer: Self-pay | Admitting: Neurosurgery

## 2024-11-11 NOTE — Telephone Encounter (Signed)
 Ben with [facility] left a voicemail to let our office know that he is discharging the patient today from home health physical therapy. He states that he is safe with mobility and safe in the home. He states that the patient states that he did get some symptom relief in the right lower leg but is experiencing buttock pain.   [phone]

## 2024-11-11 NOTE — Telephone Encounter (Signed)
 FYI: And he has follow up on 12/09/2024 if there is anything extra that needs to be done prior to his appointment

## 2024-11-17 ENCOUNTER — Other Ambulatory Visit: Payer: Self-pay

## 2024-11-17 ENCOUNTER — Ambulatory Visit: Admitting: Physical Medicine and Rehabilitation

## 2024-11-17 VITALS — BP 144/87 | HR 76

## 2024-11-17 DIAGNOSIS — M5416 Radiculopathy, lumbar region: Secondary | ICD-10-CM

## 2024-11-17 DIAGNOSIS — M5116 Intervertebral disc disorders with radiculopathy, lumbar region: Secondary | ICD-10-CM

## 2024-11-17 MED ORDER — METHYLPREDNISOLONE ACETATE 40 MG/ML IJ SUSP
40.0000 mg | Freq: Once | INTRAMUSCULAR | Status: AC
  Administered 2024-11-17: 40 mg

## 2024-11-17 NOTE — Procedures (Signed)
 Lumbosacral Transforaminal Epidural Steroid Injection - Sub-Pedicular Approach with Fluoroscopic Guidance  Patient: Aaron Clarke      Date of Birth: 1956/01/26 MRN: [id number] PCP: Kayla Jeoffrey RAMAN, FNP      Visit Date: 11/17/2024   Universal Protocol:    Date/Time: 11/17/2024  Consent Given By: the patient  Position: PRONE  Additional Comments: Vital signs were monitored before and after the procedure. Patient was prepped and draped in the usual sterile fashion. The correct patient, procedure, and site was verified.   Injection Procedure Details:   Procedure diagnoses: Lumbar radiculopathy [M54.16]    Meds Administered:  Meds ordered this encounter  Medications   methylPREDNISolone  acetate (DEPO-MEDROL ) injection 40 mg    Laterality: Left  Location/Site: L5, MRI defined a fully lumbarized S1 transitional segment  Needle:6.0 in., 22 ga.  Short bevel or Quincke spinal needle  Needle Placement: Transforaminal  Findings:    -Comments: There was good flow of contrast into the foramen and around the pedicle.  The patient had a lot of difficulty laying on the table on his stomach.  Depending on results may want to have the injection done with sedation.  Procedure Details: After squaring off the end-plates to get a true AP view, the C-arm was positioned so that an oblique view of the foramen as noted above was visualized. The target area is just inferior to the nose of the scotty dog or sub pedicular. The soft tissues overlying this structure were infiltrated with 2-3 ml. of 1% Lidocaine  without Epinephrine .  The spinal needle was inserted toward the target using a trajectory view along the fluoroscope beam.  Under AP and lateral visualization, the needle was advanced so it did not puncture dura and was located close the 6 O'Clock position of the pedical in AP tracterory. Biplanar projections were used to confirm position. Aspiration was confirmed to be negative for CSF  and/or blood. A 1-2 ml. volume of Isovue-250 was injected and flow of contrast was noted at each level. Radiographs were obtained for documentation purposes.   After attaining the desired flow of contrast documented above, a 0.5 to 1.0 ml test dose of 0.25% Marcaine  was injected into each respective transforaminal space.  The patient was observed for 90 seconds post injection.  After no sensory deficits were reported, and normal lower extremity motor function was noted,   the above injectate was administered so that equal amounts of the injectate were placed at each foramen (level) into the transforaminal epidural space.   Additional Comments:  No complications occurred Dressing: 2 x 2 sterile gauze and Band-Aid    Post-procedure details: Patient was observed during the procedure. Post-procedure instructions were reviewed.  Patient left the clinic in stable condition.

## 2024-11-17 NOTE — Progress Notes (Signed)
 "  Aaron Clarke - 68 y.o. male MRN [id number]  Date of birth: 10-10-1956  Office Visit Note: Visit Date: 11/17/2024 PCP: Kayla Jeoffrey RAMAN, FNP Referred by: Clois Fret, MD  Subjective: Chief Complaint  Patient presents with   Lower Back - Pain   HPI:  Aaron Clarke is a 68 y.o. male who comes in today at the request of Dr. Fret Clois for planned Left L5-S1 Lumbar Transforaminal epidural steroid injection with fluoroscopic guidance.  He was having right L5 symptoms but now more left S1 symptoms. He does have per MRI read a fully Lumbarized S1 transitional segment. The patient has failed conservative care including home exercise, medications, time and activity modification.  This injection will be diagnostic and hopefully therapeutic.  Please see requesting physician notes for further details and justification.   ROS Otherwise per HPI.  Assessment & Plan: Visit Diagnoses:    ICD-10-CM   1. Lumbar radiculopathy  M54.16 XR C-ARM NO REPORT    Epidural Steroid injection    methylPREDNISolone  acetate (DEPO-MEDROL ) injection 40 mg    2. Radiculopathy due to lumbar intervertebral disc disorder  M51.16       Plan: No additional findings.   Meds & Orders:  Meds ordered this encounter  Medications   methylPREDNISolone  acetate (DEPO-MEDROL ) injection 40 mg    Orders Placed This Encounter  Procedures   XR C-ARM NO REPORT   Epidural Steroid injection    Follow-up: Return for visit to requesting provider as needed.   Procedures: No procedures performed  Lumbosacral Transforaminal Epidural Steroid Injection - Sub-Pedicular Approach with Fluoroscopic Guidance  Patient: Aaron Clarke      Date of Birth: Sep 09, 1956 MRN: [id number] PCP: Kayla Jeoffrey RAMAN, FNP      Visit Date: 11/17/2024   Universal Protocol:    Date/Time: 11/17/2024  Consent Given By: the patient  Position: PRONE  Additional Comments: Vital signs were monitored before and after the  procedure. Patient was prepped and draped in the usual sterile fashion. The correct patient, procedure, and site was verified.   Injection Procedure Details:   Procedure diagnoses: Lumbar radiculopathy [M54.16]    Meds Administered:  Meds ordered this encounter  Medications   methylPREDNISolone  acetate (DEPO-MEDROL ) injection 40 mg    Laterality: Left  Location/Site: L5, MRI defined a fully lumbarized S1 transitional segment  Needle:6.0 in., 22 ga.  Short bevel or Quincke spinal needle  Needle Placement: Transforaminal  Findings:    -Comments: There was good flow of contrast into the foramen and around the pedicle.  The patient had a lot of difficulty laying on the table on his stomach.  Depending on results may want to have the injection done with sedation.  Procedure Details: After squaring off the end-plates to get a true AP view, the C-arm was positioned so that an oblique view of the foramen as noted above was visualized. The target area is just inferior to the nose of the scotty dog or sub pedicular. The soft tissues overlying this structure were infiltrated with 2-3 ml. of 1% Lidocaine  without Epinephrine .  The spinal needle was inserted toward the target using a trajectory view along the fluoroscope beam.  Under AP and lateral visualization, the needle was advanced so it did not puncture dura and was located close the 6 O'Clock position of the pedical in AP tracterory. Biplanar projections were used to confirm position. Aspiration was confirmed to be negative for CSF and/or blood. A 1-2 ml. volume of Isovue-250 was injected and flow of  contrast was noted at each level. Radiographs were obtained for documentation purposes.   After attaining the desired flow of contrast documented above, a 0.5 to 1.0 ml test dose of 0.25% Marcaine  was injected into each respective transforaminal space.  The patient was observed for 90 seconds post injection.  After no sensory deficits were  reported, and normal lower extremity motor function was noted,   the above injectate was administered so that equal amounts of the injectate were placed at each foramen (level) into the transforaminal epidural space.   Additional Comments:  No complications occurred Dressing: 2 x 2 sterile gauze and Band-Aid    Post-procedure details: Patient was observed during the procedure. Post-procedure instructions were reviewed.  Patient left the clinic in stable condition.    Clinical History: EXAM: MRI LUMBAR SPINE 10/09/2024 06:03:58 AM   TECHNIQUE: Multiplanar multisequence MRI of the lumbar spine was performed without the administration of intravenous contrast.   COMPARISON: Lumbar radiographs 10/01/2024. CT abdomen and pelvis 09/28/2024. CTA chest 05/25/2024.   CLINICAL HISTORY: 68 year old male with low back pain, persistent right leg pain after a fall, and suspected infection with positive X-ray/CT.   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 Correlation with radiographs is recommended prior to any operative intervention.   L5 superior endplate deformity with faint marrow edema on series 10 image 13 appears to be Schmorl node or mild compression which has not significantly changed from CT abdomen and pelvis in October. Normal background bone marrow signal. No other marrow edema. Intact visible sacrum and SI joints. Benign L1 vertebral body hemangioma.   SPINAL CORD: Normal conus medullaris at L1-L2.   SOFT TISSUES: Visible abdominal viscera are stable from the recent CT abdomen and pelvis, including asymmetric right renal atrophy. Negative lumbar paraspinal soft tissues.   DEGENERATIVE: Lower thoracic through L4 interbody ankylosis due to hyperostosis.   L4-L5: Bulky circumferential disc bulge with endplate spurring  asymmetric to the left and mostly affecting the left neural foramen. Superimposed mild to moderate posterior element hypertrophy. No spinal or convincing lateral recess stenosis. Moderate to severe left L4 neural foraminal stenosis (series 9 image 13).   L5-S1: Disc desiccation and disc space loss with bulky paracentral disc protrusion or extrusion (series 9 image 9 and series 12 image 30). Underlying circumferential disc bulge with endplate spurring, moderate posterior element hypertrophy, and some epidural lipomatosis. Severe spinal and bilateral lateral recess stenosis (descending S1 nerve levels, series 12 image 30). Moderate bilateral L5 neural foraminal stenosis.   IMPRESSION: 1. 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 2. Hyperostosis with sub-total spinal interbody ankylosis; absent in the lumbar spine only at L4L5 and L5S1. 3. L5S1 bulky disc herniation contributes in large part to severe spinal and bilateral lateral recess stenosis. Query S1 radiculitis. Moderate bilateral L5 neural foraminal stenosis. 4. L4L5 bulky disc and endplate degeneration asymmetric to the left with moderate to severe left L4 foraminal stenosis.   Electronically signed by: Helayne Hurst MD 10/09/2024 06:24 AM EST RP Workstation: [id number]     Objective:  VS:  HT:    WT:   BMI:     BP:(!) 144/87  HR:76bpm  TEMP: ( )  RESP:  Physical Exam Vitals and nursing note reviewed.  Constitutional:      General: He is not  in acute distress.    Appearance: Normal appearance. He is not ill-appearing.  HENT:     Head: Normocephalic and atraumatic.     Right Ear: External ear normal.     Left Ear: External ear normal.     Nose: No congestion.  Eyes:     Extraocular Movements: Extraocular movements intact.  Cardiovascular:     Rate and Rhythm: Normal rate.     Pulses: Normal pulses.   Pulmonary:     Effort: Pulmonary effort is normal. No respiratory distress.  Abdominal:     General: There is no distension.     Palpations: Abdomen is soft.  Musculoskeletal:        General: Tenderness present. No signs of injury.     Cervical back: Neck supple.     Right lower leg: No edema.     Left lower leg: No edema.     Comments: Patient has good distal strength without clonus.  Skin:    Findings: No erythema or rash.  Neurological:     General: No focal deficit present.     Mental Status: He is alert and oriented to person, place, and time.     Cranial Nerves: No cranial nerve deficit.     Sensory: No sensory deficit.     Motor: No weakness or abnormal muscle tone.     Coordination: Coordination normal.     Gait: Gait abnormal.     Comments: Sitting in wheelchair  Psychiatric:        Mood and Affect: Mood normal.        Behavior: Behavior normal.      Imaging: No results found. "

## 2024-11-17 NOTE — Progress Notes (Signed)
 Pain Scale   Average Pain 6 Patient advising he has lower back pain radiating to bilateral legs. Patient advising his pain is chronic.         +Driver, +BT, -Dye Allergies.

## 2024-11-21 NOTE — Telephone Encounter (Signed)
 Patient reports that the recent injection provided no relief and states their quality of life has significantly declined. They are requesting an earlier follow-up than the scheduled 1/20 appointment. Additionally, the patient reports bowel issues requiring the use of adult briefs. Patient also states that they cannot walk as well. I have placed the pt on the WL.

## 2024-11-24 ENCOUNTER — Ambulatory Visit: Payer: Self-pay | Admitting: Family Medicine

## 2024-11-24 NOTE — Telephone Encounter (Signed)
 FYI Only or Action Required?: Action required by provider: request for appointment, medication refill request, and update on patient condition.  Patient was last seen in primary care on 10/30/2024 by Kayla Jeoffrey RAMAN, FNP.  Called Nurse Triage reporting Back Pain.  Symptoms began several weeks ago.  Interventions attempted: Prescription medications: lidocaine  patches; dilaudid ; robaxin .  Symptoms are: rapidly worsening.  Triage Disposition: See HCP Within 4 Hours (Or PCP Triage)  Patient/caregiver understands and will follow disposition?: No, wishes to speak with PCP    Copied from CRM [id number]. Topic: Clinical - Red Word Triage >> Nov 24, 2024 12:35 PM Alfonso ORN wrote: Red Word that prompted transfer to Nurse Triage: lisa grubbs stated PT stopped Physical therapy for pt due to safety due to extreme pain, unable to walk  requesting fill of med:   HYDROmorphone  (DILAUDID ) 2 MG tablet methocarbamol  (ROBAXIN ) 500 MG tablet [[id number]]     Reason for Disposition  [1] SEVERE back pain (e.g., excruciating, unable to do any normal activities) AND [2] not improved 2 hours after pain medicine  Answer Assessment - Initial Assessment Questions Pt and his wife on speaker contacted clinic to discussed ortho surgery and medication refills. Pt and wife frustrated with physical therapist as he stated he was only there to teach safety and did not speak positively about PCP. Pt and wife felt like physical therapist wasted their time when they were under the impression that physical therapy would be their bridge to ortho. Stated physical therapist recommended a wheelchair and if pt wasn't agreeable he could go back to ED and request rehab facility placement. At last ortho appt, 12/29, pt received steroid injection but has not had any relief of symptoms. Also reported his L1 was blown. Pt reports pain is worsening from his R to L leg, my good leg is weak. Pt's wife reports incontinence is also worsening  and pt is wearing depends at all times now. Pt sleeps in hospital bed and uses lifting recliner; was told he could get a rollator walker but never did. States that when pt has to get up he cries out; eased when he sits back down. Pt last ED visit 12/02. Pt and wife are at a loss of where to go from here and states that pain keeps worsening. PCP does have appts available this week and pt available for virtual via my chart if PCP would like to talk with him. Pt and wife requested call back to, [phone].    1. ONSET: When did the pain begin? (e.g., minutes, hours, days)     Ongoing; pt has been evaluated by PCP, neuro, ortho and physical therapy   2. LOCATION: Where does it hurt? (upper, mid or lower back)     Mid to lower back, radiating to L hip and groin; radiation with pain to L leg   3. SEVERITY: How bad is the pain?  (e.g., Scale 1-10; mild, moderate, or severe)     10  4. PATTERN: Is the pain constant? (e.g., yes, no; constant, intermittent)      Eases with rest; pt stays in a hospital bed/lift chair   5. RADIATION: Does the pain shoot into your legs or somewhere else?     Yes; left leg   6. CAUSE:  What do you think is causing the back pain?      Pt has extensive back hx   7. BACK OVERUSE:  Any recent lifting of heavy objects, strenuous work or exercise?  No   8. MEDICINES: What have you taken so far for the pain? (e.g., nothing, acetaminophen , NSAIDS)     Lidocaine  patches, heat, dilaudid , robaxin    9. NEUROLOGIC SYMPTOMS: Do you have any weakness, numbness, or problems with bowel/bladder control?     Yes; worsening incontinence   10. OTHER SYMPTOMS: Do you have any other symptoms? (e.g., fever, abdomen pain, burning with urination, blood in urine)       No  Protocols used: Back Pain-A-AH

## 2024-11-24 NOTE — Telephone Encounter (Signed)
 Patient and his wife advised and appointment moved up

## 2024-11-25 ENCOUNTER — Telehealth: Payer: Self-pay | Admitting: Family Medicine

## 2024-11-25 NOTE — Telephone Encounter (Signed)
 Called patient to discuss CRM note regarding pain. Unable to leave VM due to box full. Patient is seeing orthopedics for management of this and on high doses of pain medications. Does he need a referral to a pain clinic? Was doing OK at his office visit with me 1 month ago and has also seen Ortho for [facility] 12/29. If pain is acutely worsening needs an appointment. Is scheduled with Neurosurgery as well January 20th.

## 2024-11-27 ENCOUNTER — Ambulatory Visit (INDEPENDENT_AMBULATORY_CARE_PROVIDER_SITE_OTHER): Admitting: Neurosurgery

## 2024-11-27 ENCOUNTER — Other Ambulatory Visit: Payer: Self-pay

## 2024-11-27 ENCOUNTER — Encounter: Payer: Self-pay | Admitting: Neurosurgery

## 2024-11-27 ENCOUNTER — Telehealth (HOSPITAL_BASED_OUTPATIENT_CLINIC_OR_DEPARTMENT_OTHER): Payer: Self-pay

## 2024-11-27 VITALS — BP 130/82 | Wt 294.8 lb

## 2024-11-27 DIAGNOSIS — R29898 Other symptoms and signs involving the musculoskeletal system: Secondary | ICD-10-CM

## 2024-11-27 DIAGNOSIS — R1032 Left lower quadrant pain: Secondary | ICD-10-CM | POA: Diagnosis not present

## 2024-11-27 DIAGNOSIS — Z01818 Encounter for other preprocedural examination: Secondary | ICD-10-CM

## 2024-11-27 DIAGNOSIS — M5416 Radiculopathy, lumbar region: Secondary | ICD-10-CM

## 2024-11-27 MED ORDER — METHOCARBAMOL 500 MG PO TABS: 750.0000 mg | ORAL_TABLET | Freq: Three times a day (TID) | ORAL | 0 refills | Status: AC | PRN

## 2024-11-27 MED ORDER — HYDROMORPHONE HCL 2 MG PO TABS: 2.0000 mg | ORAL_TABLET | Freq: Four times a day (QID) | ORAL | 0 refills | Status: AC | PRN

## 2024-11-27 NOTE — Progress Notes (Signed)
 "   Referring Physician:  Kayla Jeoffrey RAMAN, FNP [address] [place],  [place] [address]  Primary Physician:  Kayla Jeoffrey RAMAN, FNP  History of Present Illness: 11/27/2024 [name] presents today with worsening symptoms.  He has tried home health physical therapy but has not been able to tolerate it.  He is having severe pain down his left leg into his left groin.  10/13/2024 [name] is here today with a chief complaint of worsening right leg pain and instability.  He underwent a right total hip replacement in April with good early relief, but after 3 to 4 months developed progressive right leg pain that has continued to worsen.  The pain involves the right groin and leg, sometimes radiating to the ankle and top of the foot. It is worse when lying down and now limits him to very short walking distances such as from his lift chair to the bathroom.  On November 12 his right leg gave way while he was getting into his truck, causing a fall. His leg has also given way at home, which he attributes to pain rather than weakness. He has numbness over the top of the right leg since surgery, without numbness or tingling below the knee.  He is taking gabapentin  300 mg three times daily, methocarbamol , and oxycodone  as needed. Pain medication allows him to sleep. 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     Discussed the use of AI scribe software for clinical note transcription with the patient, who gave verbal consent to proceed. Bowel/Bladder Dysfunction: none  Conservative measures:  Physical therapy:  HH PT was recommended at the hospital upon discharge but no referral was placed  Multimodal medical therapy including regular antiinflammatories:   Prednisone , Tramadol , Neurontin , Tylenol , oxycodone  and Robaxin  Injections: no epidural steroid injections  Past Surgery: no spine surgery  [name] has no symptoms of cervical myelopathy.  The symptoms are causing a significant impact on the patient's life.   I have utilized the care everywhere function in epic to review the outside records available from external health systems.   Review of Systems:  A 10 point review of systems is negative, except for the pertinent positives and negatives detailed in the HPI.  Past Medical History: 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   Family history of breast cancer    mother   Hypertension    Osteoarthritis of right hip    PE (pulmonary thromboembolism) (HCC) 10/23/2022    Past Surgical History: Past Surgical History:  Procedure Laterality Date   CHOLECYSTECTOMY  12/19/2011   Procedure: LAPAROSCOPIC CHOLECYSTECTOMY WITH INTRAOPERATIVE CHOLANGIOGRAM;  Surgeon: Elon CHRISTELLA Pacini, MD;  Location: [facility] OR;  Service: General;  Laterality: N/A;   COLONOSCOPY WITH PROPOFOL  N/A 09/13/2022   Procedure: COLONOSCOPY WITH PROPOFOL ;  Surgeon: Unk Corinn Skiff, MD;  Location: [facility] ENDOSCOPY;  Service: Gastroenterology;  Laterality: N/A;   Colonscopy     Fatty tumor  2003   L chest   LEFT HEART CATH AND CORONARY ANGIOGRAPHY N/A 08/01/2024   Procedure: LEFT HEART CATH AND CORONARY ANGIOGRAPHY;  Surgeon: Anner Alm ORN, MD;  Location: [facility] INVASIVE CV LAB;  Service: Cardiovascular;  Laterality: N/A;   TOTAL HIP ARTHROPLASTY Right  02/19/2024   Procedure: ARTHROPLASTY, HIP, TOTAL, ANTERIOR APPROACH;  Surgeon: Vernetta Lonni GRADE, MD;  Location: [facility] OR;  Service: Orthopedics;  Laterality: Right;    Allergies: Allergies as of 11/27/2024   (No Known Allergies)    Medications:  Current Outpatient Medications:    acetaminophen  (TYLENOL ) 500 MG tablet, Take 2 tablets (1,000 mg total) by mouth every 8 (eight) hours., Disp: , Rfl:    albuterol  (PROVENTIL )  (2.5 MG/3ML) 0.083% nebulizer solution, Take 3 mLs (2.5 mg total) by nebulization every 6 (six) hours as needed for wheezing or shortness of breath., Disp: 75 mL, Rfl: 0   albuterol  (VENTOLIN  HFA) 108 (90 Base) MCG/ACT inhaler, Inhale 1-2 puffs into the lungs every 6 (six) hours as needed for wheezing or shortness of breath., Disp: 8 g, Rfl: 0   apixaban  (ELIQUIS ) 5 MG TABS tablet, Take 1 tablet (5 mg total) by mouth 2 (two) times daily., Disp: 60 tablet, Rfl: 1   bisoprolol  (ZEBETA ) 5 MG tablet, TAKE 1 TABLET BY MOUTH DAILY, Disp: 100 tablet, Rfl: 2   budesonide -formoterol  (SYMBICORT ) 160-4.5 MCG/ACT inhaler, USE 2 INHALATIONS BY MOUTH FIRST THING IN THE MORNING AND THEN  ANOTHER 2 INHALATIONS AFTER  ABOUT 12 HOURS LATER (Patient taking differently: Inhale 2 puffs into the lungs in the morning and at bedtime. USE 2 INHALATIONS BY MOUTH FIRST THING IN THE MORNING AND THEN  ANOTHER 2 INHALATIONS AFTER  ABOUT 12 HOURS LATER), Disp: 30.6 g, Rfl: 3   cetirizine (ZYRTEC) 10 MG tablet, Take 10 mg by mouth daily., Disp: , Rfl:    gabapentin  (NEURONTIN ) 300 MG capsule, Take 1 capsule (300 mg total) by mouth 4 (four) times daily., Disp: 120 capsule, Rfl: 0   HYDROmorphone  (DILAUDID ) 2 MG tablet, Take 1 tablet (2 mg total) by mouth every 6 (six) hours as needed for severe pain (pain score 7-10)., Disp: 90 tablet, Rfl: 0   lidocaine  (LIDODERM ) 5 %, Place 1 patch onto the skin daily., Disp: , Rfl:    losartan  (COZAAR ) 25 MG tablet, Take 1 tablet (25 mg total) by mouth daily., Disp: 30 tablet, Rfl: 3   methocarbamol  (ROBAXIN ) 500 MG tablet, Take 1.5 tablets (750 mg total) by mouth every 8 (eight) hours as needed for muscle spasms., Disp: 90 tablet, Rfl: 0   polyethylene glycol (MIRALAX  / GLYCOLAX ) 17 g packet, Take 17 g by mouth daily. (Patient taking differently: Take 17 g by mouth daily as needed for mild constipation.), Disp: 28 each, Rfl: 1   rosuvastatin  (CRESTOR ) 10 MG tablet, Take 1 tablet (10 mg total) by  mouth daily., Disp: 90 tablet, Rfl: 2   senna-docusate (SENOKOT-S) 8.6-50 MG tablet, Take 1 tablet by mouth at bedtime as needed for mild constipation., Disp: 20 tablet, Rfl: 0  Social History: Social History   Tobacco Use   Smoking status: Former    Current packs/day: 0.00    Average packs/day: 2.0 packs/day for 10.0 years (20.0 ttl pk-yrs)    Types: Cigarettes    Start date: 06/19/1974    Quit date: 06/19/1988    Years since quitting: 36.4   Smokeless tobacco: Never  Vaping Use   Vaping status: Never Used  Substance Use Topics   Alcohol use: Not Currently    Comment: Quit drinking in 2023   Drug use: Never    Family Medical History: Family History  Problem Relation Age of Onset   Cancer Mother        breast   Cancer Father  colon   Heart disease Father    Asthma Sister    Anesthesia problems Neg Hx     Physical Examination: Vitals:   11/27/24 1317  BP: 130/82    General: Patient is in no apparent distress. Attention to examination is appropriate.  Neck:   Supple.  Full range of motion.  Respiratory: Patient is breathing without any difficulty.   NEUROLOGICAL:     Awake, alert, oriented to person, place, and time.  Speech is clear and fluent.   Cranial Nerves: Pupils equal round and reactive to light.  Facial tone is symmetric.  Facial sensation is symmetric. Shoulder shrug is symmetric. Tongue protrusion is midline.  There is no pronator drift.  Strength: Side Biceps Triceps Deltoid Interossei Grip Wrist Ext. Wrist Flex.  R 5 5 5 5 5 5 5   L 5 5 5 5 5 5 5    Side Iliopsoas Quads Hamstring PF DF EHL  R 5 5 5 5 5 5   L 4+ 5 5 4+ 4+ 4+   Reflexes are 1+ and symmetric at the biceps, triceps, brachioradialis, patella and achilles.   Hoffman's is absent.   Bilateral upper and lower extremity sensation is intact to light touch.    No evidence of dysmetria noted.  Gait is abnormal-uses a walker.  FABER positive on L  +TTP L lateral hip  Medical  Decision Making  Imaging: MRI R Hip 10/01/2024 IMPRESSION: 1. The right hip arthroplasty components appear well seated without complicating features. No findings suspicious for periprosthetic fracture. 2. Inflammations/edema noted in the right gluteal muscles suggesting muscle strains or contusions possibly related to falling. No intramuscular hematoma. 3. Moderate inflammations/edema/fluid also noted in the subcutaneous tissues likely from related to a contusion. No discrete or large hematoma. 4. No significant intrapelvic abnormalities.     Electronically Signed   By: MYRTIS Stammer M.D.   On: 10/09/2024 10:48  MRI L spine 10/01/2024 IMPRESSION: 1. 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 2. Hyperostosis with sub-total spinal interbody ankylosis; absent in the lumbar spine only at L4L5 and L5S1. 3. L5S1 bulky disc herniation contributes in large part to severe spinal and bilateral lateral recess stenosis. Query S1 radiculitis. Moderate bilateral L5 neural foraminal stenosis. 4. L4L5 bulky disc and endplate degeneration asymmetric to the left with moderate to severe left L4 foraminal stenosis.   Electronically signed by: Helayne Hurst MD 10/09/2024 06:24 AM EST RP Workstation: [id number]  I have personally reviewed the images and agree with the above interpretation.  Assessment and Plan: [name] is a pleasant 69 y.o. male with lumbar radiculopathy as well as left groin pain that recently started.  He has failed conservative management with physical therapy.  He has worsening symptoms and has now developed some mild weakness.  I think he should consider lumbar decompression with L4-5 decompression and discectomy as well as a left L3-4 far lateral discectomy.  Please note that I am using a different numbering schematic than the MRI scan.  I am numbering the most  inferior complete disc space as L5-S1 rather than S1-2 as in the MRI scan.  I would like him evaluated by his orthopedist for his left groin pain.  He says that his pain is the same as it was prior to his right hip arthroplasty.  I do think that he has both problems, but was hoping to have additional input.  I will plan for L4-5 discectomy  and left L3-4 far lateral discectomy and foraminotomies 6 weeks after his injection.  I discussed the planned procedure at length with the patient, including the risks, benefits, alternatives, and indications. The risks discussed include but are not limited to bleeding, infection, need for reoperation, spinal fluid leak, stroke, vision loss, anesthetic complication, coma, paralysis, and even death. I also described in detail that improvement was not guaranteed.  The patient expressed understanding of these risks, and asked that we proceed with surgery. I described the surgery in layman's terms, and gave ample opportunity for questions, which were answered to the best of my ability.   I spent a total of 30 minutes in this patient's care today. This time was spent reviewing pertinent records including imaging studies, obtaining and confirming history, performing a directed evaluation, formulating and discussing my recommendations, and documenting the visit within the medical record.      Thank you for involving me in the care of this patient.      Franciszek Platten K. Clois MD, [facility]  "

## 2024-11-27 NOTE — Telephone Encounter (Signed)
 Spoke with patient's daughter, Harlene, who had attended his appointment earlier today. Informed her that I sent pt mychart message and that we had filled prescriptions for them. She confirmed that patient does not have secondary insurance.

## 2024-11-27 NOTE — Therapy (Incomplete)
 " OUTPATIENT PHYSICAL THERAPY THORACOLUMBAR EVALUATION   Patient Name: Aaron Clarke MRN: [id number] DOB:06/22/1956, 69 y.o., male Today's Date: 11/27/2024  END OF SESSION:   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   Family history of breast cancer    mother   Hypertension    Osteoarthritis of right hip    PE (pulmonary thromboembolism) (HCC) 10/23/2022   Past Surgical History:  Procedure Laterality Date   CHOLECYSTECTOMY  12/19/2011   Procedure: LAPAROSCOPIC CHOLECYSTECTOMY WITH INTRAOPERATIVE CHOLANGIOGRAM;  Surgeon: Elon CHRISTELLA Pacini, MD;  Location: [facility] OR;  Service: General;  Laterality: N/A;   COLONOSCOPY WITH PROPOFOL  N/A 09/13/2022   Procedure: COLONOSCOPY WITH PROPOFOL ;  Surgeon: Unk Corinn Skiff, MD;  Location: [facility] ENDOSCOPY;  Service: Gastroenterology;  Laterality: N/A;   Colonscopy     Fatty tumor  2003   L chest   LEFT HEART CATH AND CORONARY ANGIOGRAPHY N/A 08/01/2024   Procedure: LEFT HEART CATH AND CORONARY ANGIOGRAPHY;  Surgeon: Anner Alm ORN, MD;  Location: [facility] INVASIVE CV LAB;  Service: Cardiovascular;  Laterality: N/A;   TOTAL HIP ARTHROPLASTY Right 02/19/2024   Procedure: ARTHROPLASTY, HIP, TOTAL, ANTERIOR APPROACH;  Surgeon: Vernetta Lonni GRADE, MD;  Location: [facility] OR;  Service: Orthopedics;  Laterality: Right;   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   GERD (gastroesophageal reflux disease) 03/12/2024   Status post total  replacement of right hip 02/19/2024   Asthmatic bronchitis , chronic (HCC) 01/02/2024   Physical exam, annual 12/17/2023   Morbid obesity (HCC) 12/17/2023   BMI 38.0-38.9,adult 12/17/2023   Pain in joint of right hip 11/07/2022   Dyspnea on exertion 10/24/2022   Elevated troponin 10/24/2022   Essential hypertension 10/18/2022   History of colonic polyps    Family history of colon cancer in father    Polyp of ascending colon    Cecal polyp    Knee arthropathy 09/04/2022   Screening for colon cancer 08/07/2022   Lumbago of lumbar region with sciatica 08/07/2022   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  ONSET DATE: ***  SUBJECTIVE:  SUBJECTIVE STATEMENT: ***  PERTINENT HISTORY:  ***  PAIN:  Are you having pain? {OPRCPAIN:27236}  PRECAUTIONS: {Therapy precautions:24002}  RED FLAGS: {PT Red Flags:29287}   WEIGHT BEARING RESTRICTIONS: {Yes ***/No:24003}  FALLS:  Has patient fallen in last 6 months? {fallsyesno:27318}  LIVING ENVIRONMENT: Lives with: {OPRC lives with:25569::lives with their family} Lives in: {Lives in:25570} Stairs: {opstairs:27293} Has following equipment at home: {Assistive devices:23999}  OCCUPATION: ***  PLOF: {PLOF:24004}  PATIENT GOALS: ***  NEXT MD VISIT: ***  OBJECTIVE:  Note: Objective measures were completed at Evaluation unless otherwise noted.  DIAGNOSTIC FINDINGS:  ***  PATIENT SURVEYS:  {rehab surveys:24030}  COGNITION: Overall  cognitive status: {cognition:24006}     SENSATION: {sensation:27233}  MUSCLE LENGTH: Hamstrings: Right *** deg; Left *** deg Debby test: Right *** deg; Left *** deg  POSTURE: {posture:25561}  PALPATION: ***  LUMBAR ROM:   AROM eval  Flexion   Extension   Right lateral flexion   Left lateral flexion   Right rotation   Left rotation    (Blank rows = not tested)  LOWER EXTREMITY ROM:     {AROM/PROM:27142}  Right eval Left eval  Hip flexion    Hip extension    Hip abduction    Hip adduction    Hip internal rotation    Hip external rotation    Knee flexion    Knee extension    Ankle dorsiflexion    Ankle plantarflexion    Ankle inversion    Ankle eversion     (Blank rows = not tested)  LOWER EXTREMITY MMT:    MMT Right eval Left eval  Hip flexion    Hip extension    Hip abduction    Hip adduction    Hip internal rotation    Hip external rotation    Knee flexion    Knee extension    Ankle dorsiflexion    Ankle plantarflexion    Ankle inversion    Ankle eversion     (Blank rows = not tested)  LUMBAR SPECIAL TESTS:  {lumbar special test:25242}  FUNCTIONAL TESTS:  {Functional tests:24029}  GAIT: Distance walked: *** Assistive device utilized: {Assistive devices:23999} Level of assistance: {Levels of assistance:24026} Comments: ***  TREATMENT DATE: ***                                                                                                                                 PATIENT EDUCATION:  Education details: *** Person educated: {Person educated:25204} Education method: {Education Method:25205} Education comprehension: {Education Comprehension:25206}  HOME EXERCISE PROGRAM: ***  ASSESSMENT:  CLINICAL IMPRESSION: Patient is a *** y.o. *** who was seen today for physical therapy evaluation and treatment for ***.   OBJECTIVE IMPAIRMENTS: {opptimpairments:25111}.   ACTIVITY LIMITATIONS:  {activitylimitations:27494}  PARTICIPATION LIMITATIONS: {participationrestrictions:25113}  PERSONAL FACTORS: {Personal factors:25162} are also affecting patient's functional outcome.   REHAB POTENTIAL: {rehabpotential:25112}  CLINICAL DECISION MAKING: {clinical decision making:25114}  EVALUATION COMPLEXITY: {Evaluation complexity:25115}  GOALS: Goals reviewed with patient? {yes/no:20286}  SHORT TERM GOALS: Target date: ***  *** Baseline: Goal status: INITIAL  2.  *** Baseline:  Goal status: INITIAL  3.  *** Baseline:  Goal status: INITIAL  4.  *** Baseline:  Goal status: INITIAL  5.  *** Baseline:  Goal status: INITIAL  6.  *** Baseline:  Goal status: INITIAL  LONG TERM GOALS: Target date: ***  *** Baseline:  Goal status: INITIAL  2.  *** Baseline:  Goal status: INITIAL  3.  *** Baseline:  Goal status: INITIAL  4.  *** Baseline:  Goal status: INITIAL  5.  *** Baseline:  Goal status: INITIAL  6.  *** Baseline:  Goal status: INITIAL  PLAN:  PT FREQUENCY: {rehab frequency:25116}  PT DURATION: {rehab duration:25117}  PLANNED INTERVENTIONS: {rehab planned interventions:25118::97110-Therapeutic exercises,97530- Therapeutic [phone]- Neuromuscular re-education,97535- Self Rjmz,02859- Manual therapy,Patient/Family education}.  PLAN FOR NEXT SESSION: ***   Greig GORMAN Quivers, PT 11/27/2024, 3:10 PM  "

## 2024-11-27 NOTE — Telephone Encounter (Signed)
"  ° °  Pre-operative Risk Assessment    Patient Name: Aaron Clarke  DOB: October 07, 1956 MRN: [id number]   Date of last office visit: 08/22/24 with Rana  Date of next office visit: NA  Request for Surgical Clearance    Procedure:  L4-5 Discectomy, Left L3-4 Far Lateral Discectomy  Date of Surgery:  Clearance 12/29/24                                  Surgeon:  Dr. Clois Socks Group or Practice Name:  [facility]  Phone number:  [phone] Fax number:  [phone]   Type of Clearance Requested:   - Medical  - Pharmacy:  Hold Apixaban  (Eliquis ) 3 days prior and resume 14 days after   Type of Anesthesia:  General    Additional requests/questions:    Bonney Augustin JONETTA Delores   11/27/2024, 3:49 PM   "

## 2024-11-27 NOTE — Telephone Encounter (Signed)
" ° °  Name: Aaron Clarke  DOB: 01-22-1956  MRN: [id number]  Primary Cardiologist: Alm Clay, MD   Preoperative team, please contact this patient and set up a phone call appointment for further preoperative risk assessment. Please obtain consent and complete medication review. Thank you for your help.  I confirm that guidance regarding antiplatelet and oral anticoagulation therapy has been completed and, if necessary, noted below.  Patient takes Eliquis  for history of PE.  Cardiology does not manage patient's Eliquis .  Recommendations for holding Eliquis  prior to procedure should come from managing provider (PCP).  I also confirmed the patient resides in the state of [place] . As per [facility] Medical Board telemedicine laws, the patient must reside in the state in which the provider is licensed.   Damien JAYSON Braver, NP 11/27/2024, 4:19 PM [facility]    "

## 2024-11-27 NOTE — Patient Instructions (Signed)
 Please see below for information in regards to your upcoming surgery:   Planned surgery: L4-5 Discectomy; Left L3-4 Far Lateral Discectomy   Surgery date: 12/29/24 at [facility]: [address] [place], [place] [address]) - you will find out your arrival time the business day before your surgery.   Pre-op appointment at [facility] Pre-admit Testing: you will receive a call with a date/time for this appointment. If you are scheduled for an in person appointment, Pre-admit Testing is located on the first floor of the [facility] building, [address], [address]. During this appointment, they will advise you which medications you can take the morning of surgery, and which medications you will need to hold for surgery. Labs (such as blood work, EKG) may be done at your pre-op appointment. You are not required to fast for these labs. Should you need to change your pre-op appointment, please call Pre-admit testing at [phone].   Please bring your medication bottles or an up to date medication list to your pre-admit testing appointment (regardless of whether we have a list in your chart).     Blood thinners:   Eliquis  (apixaban ): stop Eliquis  3 days prior, resume Eliquis  14 days after     Surgical clearance: we will send a clearance form to Jeoffrey Barrio, FNP, & [facility]. They may wish to see you in their office prior to signing the clearance form. If so, they may call you to schedule an appointment.    Common restrictions after spine surgery: No bending, lifting, or twisting (BLT). Avoid lifting objects heavier than 10 pounds for the first 6 weeks after surgery. Where possible, avoid household activities that involve lifting, bending, reaching, pushing, or pulling such as laundry, vacuuming, grocery shopping, and childcare. Try to arrange for help from friends and family for these activities while you heal. Do not drive while  taking prescription pain medication. Weeks 6 through 12 after surgery: avoid lifting more than 25 pounds.     How to contact us :  If you have any questions/concerns before or after surgery, you can reach us  at [phone], or you can send a mychart message. We can be reached by phone or mychart 8am-4pm, Monday-Friday.  *Please note: Calls after 4pm are forwarded to a third party answering service. Mychart messages are not routinely monitored during evenings, weekends, and holidays. Please call our office to contact the answering service for urgent concerns during non-business hours.    If you have FMLA/disability paperwork, please drop it off or fax it to [phone]   Appointments/FMLA & disability paperwork: Reche Hait, & Nichole Registered Nurse/Surgery scheduler: Kendelyn, RN & Katie, RN Certified Medical Assistants: Don, CMA, Elenor, CMA, Damien, CMA, & Auston, [place] Physician Assistants: Lyle Decamp, PA-C, Edsel Goods, PA-C & Glade Boys, PA-C Surgeons: Penne Sharps, MD & Reeves Daisy, MD   [facility] PREADMIT TESTING VISIT and SURGERY INFORMATION SHEET   Now that surgery has been scheduled you can anticipate several phone calls from [facility] services. A pharmacy technician will call you to verify your current list of medications taken at home.               The Pre-Service Center will call to verify your insurance information and to give you billing estimates and information.             The Preadmit Testing Office will be calling to schedule a visit to obtain information for the anesthesia  team and provide instructions on preparation for surgery.  What can you expect for the Preadmit Testing Visit: Appointments may be scheduled in-person or by telephone.  If a telephone visit is scheduled, you may be asked to come into the office to have lab tests or other studies performed.   This visit will not be completed any greater than 14 days  prior to your surgery.  If your surgery has been scheduled for a future date, please do not be alarmed if we have not contacted you to schedule an appointment more than a month prior to the surgery date.    Please be prepared to provide the following information during this appointment:            -Personal medical history                                               -Medication and allergy list            -Any history of problems with anesthesia              -Recent lab work or diagnostic studies            -Please notify us  of any needs we should be aware of to provide the best care possible           -You will be provided with instructions on how to prepare for your surgery.    On The Day of Surgery:  You must have a driver to take you home after surgery, you will be asked not to drive for 24 hours following surgery.  Taxi, Gisele and non-medical transport will not be acceptable means of transportation unless you have a responsible individual who will be traveling with you.  Visitors in the surgical area:   2 people will be able to visit you in your room once your preparation for surgery has been completed. During surgery, your visitors will be asked to wait in the Surgery Waiting Area.  It is not a requirement for them to stay, if they prefer to leave and come back.  Your visitor(s) will be given an update once the surgery has been completed.  No visitors are allowed in the initial recovery room to respect patient privacy and safety.  Once you are more awake and transfer to the secondary recovery area, or are transferred to an inpatient room, visitors will again be able to see you.  To respect and protect your privacy: We will ask on the day of surgery who your driver will be and what the contact number for that individual will be. We will ask if it is okay to share information with this individual, or if there is an alternative individual that we, or the surgeon, should contact to provide  updates and information. If family or friends come to the surgical information desk requesting information about you, who you have not listed with us , no information will be given.   It may be helpful to designate someone as the main contact who will be responsible for updating your other friends and family.    PREADMIT TESTING OFFICE: [phone] SAME DAY SURGERY: [phone] We look forward to caring for you before and throughout the process of your surgery.

## 2024-11-28 ENCOUNTER — Ambulatory Visit (HOSPITAL_COMMUNITY)

## 2024-11-28 ENCOUNTER — Telehealth: Payer: Self-pay

## 2024-11-28 ENCOUNTER — Encounter (HOSPITAL_COMMUNITY): Payer: Self-pay

## 2024-11-28 NOTE — Telephone Encounter (Signed)
 Patient has been scheduled for TELEVISIT

## 2024-11-28 NOTE — Telephone Encounter (Signed)
 Patient has been scheduled for televisit med rec and consent done     Patient Consent for Virtual Visit         [name] has provided verbal consent on 11/28/2024 for a virtual visit (video or telephone).   CONSENT FOR VIRTUAL VISIT FOR:  Aaron Clarke  By participating in this virtual visit I agree to the following:  I hereby voluntarily request, consent and authorize [facility] and its employed or contracted physicians, physician assistants, nurse practitioners or other licensed health care professionals (the Practitioner), to provide me with telemedicine health care services (the Services) as deemed necessary by the treating Practitioner. I acknowledge and consent to receive the Services by the Practitioner via telemedicine. I understand that the telemedicine visit will involve communicating with the Practitioner through live audiovisual communication technology and the disclosure of certain medical information by electronic transmission. I acknowledge that I have been given the opportunity to request an in-person assessment or other available alternative prior to the telemedicine visit and am voluntarily participating in the telemedicine visit.  I understand that I have the right to withhold or withdraw my consent to the use of telemedicine in the course of my care at any time, without affecting my right to future care or treatment, and that the Practitioner or I may terminate the telemedicine visit at any time. I understand that I have the right to inspect all information obtained and/or recorded in the course of the telemedicine visit and may receive copies of available information for a reasonable fee.  I understand that some of the potential risks of receiving the Services via telemedicine include:  Delay or interruption in medical evaluation due to technological equipment failure or disruption; Information transmitted may not be sufficient (e.g. poor resolution of  images) to allow for appropriate medical decision making by the Practitioner; and/or  In rare instances, security protocols could fail, causing a breach of personal health information.  Furthermore, I acknowledge that it is my responsibility to provide information about my medical history, conditions and care that is complete and accurate to the best of my ability. I acknowledge that Practitioner's advice, recommendations, and/or decision may be based on factors not within their control, such as incomplete or inaccurate data provided by me or distortions of diagnostic images or specimens that may result from electronic transmissions. I understand that the practice of medicine is not an exact science and that Practitioner makes no warranties or guarantees regarding treatment outcomes. I acknowledge that a copy of this consent can be made available to me via my patient portal [facility] MyChart), or I can request a printed copy by calling the office of [facility].    I understand that my insurance will be billed for this visit.   I have read or had this consent read to me. I understand the contents of this consent, which adequately explains the benefits and risks of the Services being provided via telemedicine.  I have been provided ample opportunity to ask questions regarding this consent and the Services and have had my questions answered to my satisfaction. I give my informed consent for the services to be provided through the use of telemedicine in my medical care

## 2024-12-01 NOTE — Telephone Encounter (Signed)
 Open in error

## 2024-12-02 ENCOUNTER — Ambulatory Visit (HOSPITAL_BASED_OUTPATIENT_CLINIC_OR_DEPARTMENT_OTHER): Attending: Primary Care | Admitting: Pulmonary Disease

## 2024-12-02 VITALS — Ht 72.0 in | Wt 292.0 lb

## 2024-12-02 DIAGNOSIS — G4733 Obstructive sleep apnea (adult) (pediatric): Secondary | ICD-10-CM | POA: Insufficient documentation

## 2024-12-02 DIAGNOSIS — R0683 Snoring: Secondary | ICD-10-CM | POA: Diagnosis present

## 2024-12-03 ENCOUNTER — Other Ambulatory Visit: Payer: Self-pay

## 2024-12-03 ENCOUNTER — Ambulatory Visit: Admitting: Sports Medicine

## 2024-12-03 DIAGNOSIS — M5116 Intervertebral disc disorders with radiculopathy, lumbar region: Secondary | ICD-10-CM | POA: Diagnosis not present

## 2024-12-03 DIAGNOSIS — M1612 Unilateral primary osteoarthritis, left hip: Secondary | ICD-10-CM | POA: Diagnosis not present

## 2024-12-03 DIAGNOSIS — M25552 Pain in left hip: Secondary | ICD-10-CM

## 2024-12-03 MED ORDER — LIDOCAINE HCL 1 % IJ SOLN
4.0000 mL | INTRAMUSCULAR | Status: AC | PRN
  Administered 2024-12-03: 4 mL

## 2024-12-03 MED ORDER — METHYLPREDNISOLONE ACETATE 40 MG/ML IJ SUSP
80.0000 mg | INTRAMUSCULAR | Status: AC | PRN
  Administered 2024-12-03: 80 mg via INTRA_ARTICULAR

## 2024-12-03 NOTE — Progress Notes (Addendum)
 "  Jakai Risse - 69 y.o. male MRN [id number]  Date of birth: Oct 05, 1956  Office Visit Note: Visit Date: 12/03/2024 PCP: Kayla Jeoffrey RAMAN, FNP Referred by: Kayla Jeoffrey RAMAN, FNP  Subjective: Chief Complaint  Patient presents with   Left Hip - Pain   HPI: Severus Brodzinski is a pleasant 69 y.o. male who presents today for acute on chronic left hip pain with OA.  His caregiver is present during the visit today who does help provide some of the HPI.   Kaelen has left hip pain but also complained of difficulty walking.  He has pain with movement about the hip but also within the back that goes down the legs. He does follow with neurosurgery.  He is planned for a two-level lumbar laminectomy/decompression microdiscectomy with Dr. Clois on 12/29/24, would like the hip to be feeling well before this. He is s/p R-hip THA with Dr. Vernetta that initially did very well in terms of pain control.  Lab Results  Component Value Date   HGBA1C 5.5 08/28/2024   Pertinent ROS were reviewed with the patient and found to be negative unless otherwise specified above in HPI.   Assessment & Plan: Visit Diagnoses:  1. Unilateral primary osteoarthritis, left hip   2. Pain in left hip   3. Radiculopathy due to lumbar intervertebral disc disorder    Plan: Impression is acute on chronic left hip pain in the setting of previous right total hip arthroplasty and lumbar DDD with radiculopathy.  AP x-rays from previous right hip show moderate left hip osteoarthritis.  He definitely has pain emanating from the hip and with motion, although his main complaint is difficulty walking and there likely is overlap between hip arthritis and is notable lumbar spine pathology with radiculopathy.  Through shared decision making, we did proceed with ultrasound-guided left hip intra-articular injection, patient tolerated well.  Advised on postinjection protocol.  May use ice/heat and/or Tylenol  for any postinjection pain.  He  will move forward with this and see what degree the percentage of relief he gets from this which will help tease out hip related pathology.  He will continue with his lumbar discectomy and I did recommend him following up with Dr. Vernetta following this to discuss the hip specifically.  May benefit from updated left hip x-rays specifically.  He is managed on Dilaudid  as well as gabapentin  300 mg 4 times daily, okay to continue this for his pain.  He will see me back only as needed.  Follow-up: Return if symptoms worsen or fail to improve.   Meds & Orders: No orders of the defined types were placed in this encounter.   Orders Placed This Encounter  Procedures   Large Joint Inj   US  Guided Needle Placement - No Linked Charges     Procedures: Large Joint Inj: L hip joint on 12/03/2024 11:12 AM Indications: pain Details: 22 G 3.5 in needle, ultrasound-guided anterior approach Medications: 4 mL lidocaine  1 %; 80 mg methylPREDNISolone  acetate 40 MG/ML Outcome: tolerated well, no immediate complications  Procedure: US -guided intra-articular hip injection, Left After discussion on risks/benefits/indications and informed verbal consent was obtained, a timeout was performed. Patient was lying supine on exam table. The hip was cleaned with betadine  and alcohol swabs . Then utilizing ultrasound guidance, the patient's femoral head and neck junction was identified and subsequently injected with 4:2 lidocaine :depomedrol via an in-plane approach with ultrasound visualization of the injectate administered into the hip joint. Patient tolerated procedure well without immediate complications.  Procedure, treatment alternatives, risks and benefits explained, specific risks discussed. Consent was given by the patient. Immediately prior to procedure a time out was called to verify the correct patient, procedure, equipment, support staff and site/side marked as required. Patient was prepped and draped in the usual  sterile fashion.          Clinical History: EXAM: MRI LUMBAR SPINE 10/09/2024 06:03:58 AM   TECHNIQUE: Multiplanar multisequence MRI of the lumbar spine was performed without the administration of intravenous contrast.   COMPARISON: Lumbar radiographs 10/01/2024. CT abdomen and pelvis 09/28/2024. CTA chest 05/25/2024.   CLINICAL HISTORY: 69 year old male with low back pain, persistent right leg pain after a fall, and suspected infection with positive X-ray/CT.   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 Correlation with radiographs is recommended prior to any operative intervention.   L5 superior endplate deformity with faint marrow edema on series 10 image 13 appears to be Schmorl node or mild compression which has not significantly changed from CT abdomen and pelvis in October. Normal background bone marrow signal. No other marrow edema. Intact visible sacrum and SI joints. Benign L1 vertebral body hemangioma.   SPINAL CORD: Normal conus medullaris at L1-L2.   SOFT TISSUES: Visible abdominal viscera are stable from the recent CT abdomen and pelvis, including asymmetric right renal atrophy. Negative lumbar paraspinal soft tissues.   DEGENERATIVE: Lower thoracic through L4 interbody ankylosis due to hyperostosis.   L4-L5: Bulky circumferential disc bulge with endplate spurring asymmetric to the left and mostly affecting the left neural foramen. Superimposed mild to moderate posterior element hypertrophy. No spinal or convincing lateral recess stenosis. Moderate to severe left L4 neural foraminal stenosis (series 9 image 13).   L5-S1: Disc desiccation and disc space loss with bulky paracentral disc protrusion or extrusion (series 9 image 9 and series 12 image 30). Underlying circumferential disc bulge with endplate spurring,  moderate posterior element hypertrophy, and some epidural lipomatosis. Severe spinal and bilateral lateral recess stenosis (descending S1 nerve levels, series 12 image 30). Moderate bilateral L5 neural foraminal stenosis.   IMPRESSION: 1. 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 2. Hyperostosis with sub-total spinal interbody ankylosis; absent in the lumbar spine only at L4L5 and L5S1. 3. L5S1 bulky disc herniation contributes in large part to severe spinal and bilateral lateral recess stenosis. Query S1 radiculitis. Moderate bilateral L5 neural foraminal stenosis. 4. L4L5 bulky disc and endplate degeneration asymmetric to the left with moderate to severe left L4 foraminal stenosis.   Electronically signed by: Helayne Hurst MD 10/09/2024 06:24 AM EST RP Workstation: [id number]  He reports that he quit smoking about 36 years ago. His smoking use included cigarettes. He started smoking about 50 years ago. He has a 20 pack-year smoking history. He has never used smokeless tobacco.  Recent Labs    12/17/23 1224 08/28/24 0919  HGBA1C 5.7* 5.5    Objective:    Physical Exam  Gen: Well-appearing, in no acute distress; non-toxic CV: Well-perfused. Warm.  Resp: Breathing unlabored on room air; no wheezing. Psych: Fluid speech in conversation; appropriate affect; normal thought process  Ortho Exam - Left hip: There is significant pain with movement about the hip in any direction.  There is a mild hip flexion contracture.  Difficulty with transferring from moving wheelchair to  table.  No hip joint effusion or overlying cellulitis noted.  Imaging:  *10/20/24: 2 view right hip x-ray from 10/20/2024 was independently reviewed and interpreted by myself.  Well-seated total hip arthroplasty in the right hip.  There is at least moderate hip joint narrowing on the left hip with arthritic  change.  Past Medical/Family/Surgical/Social History: Medications & Allergies reviewed per EMR, new medications updated. 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   GERD (gastroesophageal reflux disease) 03/12/2024   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   Asthmatic bronchitis , chronic (HCC) 01/02/2024   Physical exam, annual 12/17/2023   Morbid obesity (HCC) 12/17/2023   BMI 38.0-38.9,adult 12/17/2023   Pain in joint of right hip 11/07/2022   Dyspnea on exertion 10/24/2022   Elevated troponin 10/24/2022   Essential hypertension 10/18/2022   History of colonic polyps    Family history of colon cancer in father    Polyp of ascending colon    Cecal polyp    Knee arthropathy 09/04/2022   Screening for colon cancer 08/07/2022   Lumbago of lumbar region with sciatica 08/07/2022   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   Rosacea 05/20/2021   Cholecystitis with cholelithiasis 12/19/2011   Obesity, Class III, BMI 40-49.9 (morbid obesity) (HCC) 12/14/2011   Asthma 12/14/2011   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   Family history of breast cancer    mother   Hypertension    Osteoarthritis of right hip    PE (pulmonary thromboembolism) (HCC) 10/23/2022   Family History  Problem Relation Age of Onset   Cancer Mother        breast   Cancer Father        colon   Heart disease Father    Asthma Sister    Anesthesia problems Neg Hx    Past  Surgical History:  Procedure Laterality Date   CHOLECYSTECTOMY  12/19/2011   Procedure: LAPAROSCOPIC CHOLECYSTECTOMY WITH INTRAOPERATIVE CHOLANGIOGRAM;  Surgeon: Elon CHRISTELLA Pacini, MD;  Location: [facility] OR;  Service: General;  Laterality: N/A;   COLONOSCOPY WITH PROPOFOL  N/A 09/13/2022   Procedure: COLONOSCOPY WITH PROPOFOL ;  Surgeon: Unk Corinn Skiff, MD;  Location: [facility] ENDOSCOPY;  Service: Gastroenterology;  Laterality: N/A;   Colonscopy     Fatty tumor  2003   L chest   LEFT HEART CATH AND CORONARY ANGIOGRAPHY N/A 08/01/2024   Procedure: LEFT HEART CATH AND CORONARY ANGIOGRAPHY;  Surgeon: Anner Alm ORN, MD;  Location: [facility] INVASIVE CV LAB;  Service: Cardiovascular;  Laterality: N/A;   TOTAL HIP ARTHROPLASTY Right 02/19/2024   Procedure: ARTHROPLASTY, HIP, TOTAL, ANTERIOR APPROACH;  Surgeon: Vernetta Lonni GRADE, MD;  Location: [facility] OR;  Service: Orthopedics;  Laterality: Right;   Social History   Occupational History   Not on file  Tobacco Use   Smoking status: Former    Current packs/day: 0.00    Average packs/day: 2.0 packs/day for 10.0 years (20.0 ttl pk-yrs)  Types: Cigarettes    Start date: 06/19/1974    Quit date: 06/19/1988    Years since quitting: 36.4   Smokeless tobacco: Never  Vaping Use   Vaping status: Never Used  Substance and Sexual Activity   Alcohol use: Not Currently    Comment: Quit drinking in 2023   Drug use: Never   Sexual activity: Not Currently    Birth control/protection: None   "

## 2024-12-05 ENCOUNTER — Telehealth: Payer: Self-pay

## 2024-12-05 NOTE — Telephone Encounter (Signed)
 [facility] at [facility]  faxed in Surgery clearance on 11/27/2024   Surgeon: Reeves Aaron Daisy, MD  [facility]   Date Of Procedure 12/29/2024 Planned procedure and Anesthesia Type : L4-5 Discectomy: lft L3-4 far lateral Discectomy / general anesthesia   Note: we have instructed the pt . To hold the following medications for the procedure as listed below:   Eliquis  ( apixaban ) stop eliquis  3 days prior , resume eliquis  14 days aftr  Is this pt. Optimized to have the above procedure : yes   Risk : above average risk .   Reviewed and signed by provider Aaron Clarke on 12/04/2024 Faxed to [email or web address]  by CMA SRP ON 12/05/2024 Phone : [phone] Fax : [phone]

## 2024-12-09 ENCOUNTER — Ambulatory Visit: Admitting: Neurosurgery

## 2024-12-12 ENCOUNTER — Ambulatory Visit: Payer: Self-pay

## 2024-12-12 ENCOUNTER — Other Ambulatory Visit: Payer: Self-pay

## 2024-12-12 ENCOUNTER — Telehealth: Payer: Self-pay | Admitting: Radiology

## 2024-12-12 ENCOUNTER — Telehealth: Payer: Self-pay | Admitting: Neurosurgery

## 2024-12-12 ENCOUNTER — Emergency Department (HOSPITAL_COMMUNITY)
Admission: EM | Admit: 2024-12-12 | Discharge: 2024-12-12 | Disposition: A | Discharge status: Home / Self Care | Attending: Emergency Medicine | Admitting: Emergency Medicine

## 2024-12-12 ENCOUNTER — Emergency Department (HOSPITAL_COMMUNITY)

## 2024-12-12 ENCOUNTER — Encounter (HOSPITAL_COMMUNITY): Payer: Self-pay | Admitting: Emergency Medicine

## 2024-12-12 DIAGNOSIS — Z7901 Long term (current) use of anticoagulants: Secondary | ICD-10-CM | POA: Insufficient documentation

## 2024-12-12 DIAGNOSIS — Z7951 Long term (current) use of inhaled steroids: Secondary | ICD-10-CM | POA: Insufficient documentation

## 2024-12-12 DIAGNOSIS — W07XXXA Fall from chair, initial encounter: Secondary | ICD-10-CM | POA: Diagnosis not present

## 2024-12-12 DIAGNOSIS — N189 Chronic kidney disease, unspecified: Secondary | ICD-10-CM | POA: Insufficient documentation

## 2024-12-12 DIAGNOSIS — I129 Hypertensive chronic kidney disease with stage 1 through stage 4 chronic kidney disease, or unspecified chronic kidney disease: Secondary | ICD-10-CM | POA: Insufficient documentation

## 2024-12-12 DIAGNOSIS — Z86711 Personal history of pulmonary embolism: Secondary | ICD-10-CM | POA: Diagnosis not present

## 2024-12-12 DIAGNOSIS — J45909 Unspecified asthma, uncomplicated: Secondary | ICD-10-CM | POA: Diagnosis not present

## 2024-12-12 DIAGNOSIS — Z79899 Other long term (current) drug therapy: Secondary | ICD-10-CM | POA: Insufficient documentation

## 2024-12-12 DIAGNOSIS — W19XXXA Unspecified fall, initial encounter: Secondary | ICD-10-CM

## 2024-12-12 DIAGNOSIS — M25552 Pain in left hip: Secondary | ICD-10-CM | POA: Diagnosis present

## 2024-12-12 DIAGNOSIS — G8929 Other chronic pain: Secondary | ICD-10-CM | POA: Diagnosis not present

## 2024-12-12 DIAGNOSIS — M549 Dorsalgia, unspecified: Secondary | ICD-10-CM | POA: Insufficient documentation

## 2024-12-12 LAB — URINALYSIS, ROUTINE W REFLEX MICROSCOPIC
Bacteria, UA: NONE SEEN
Bilirubin Urine: NEGATIVE
Glucose, UA: NEGATIVE mg/dL
Ketones, ur: NEGATIVE mg/dL
Leukocytes,Ua: NEGATIVE
Nitrite: NEGATIVE
Protein, ur: NEGATIVE mg/dL
Specific Gravity, Urine: 1.024 (ref 1.005–1.030)
pH: 5 (ref 5.0–8.0)

## 2024-12-12 LAB — CBC WITH DIFFERENTIAL/PLATELET
Abs Immature Granulocytes: 0.04 K/uL (ref 0.00–0.07)
Basophils Absolute: 0 K/uL (ref 0.0–0.1)
Basophils Relative: 1 %
Eosinophils Absolute: 0.2 K/uL (ref 0.0–0.5)
Eosinophils Relative: 3 %
HCT: 46.1 % (ref 39.0–52.0)
Hemoglobin: 14.8 g/dL (ref 13.0–17.0)
Immature Granulocytes: 1 %
Lymphocytes Relative: 21 %
Lymphs Abs: 1.5 K/uL (ref 0.7–4.0)
MCH: 29.4 pg (ref 26.0–34.0)
MCHC: 32.1 g/dL (ref 30.0–36.0)
MCV: 91.5 fL (ref 80.0–100.0)
Monocytes Absolute: 0.7 K/uL (ref 0.1–1.0)
Monocytes Relative: 10 %
Neutro Abs: 4.8 K/uL (ref 1.7–7.7)
Neutrophils Relative %: 64 %
Platelets: 147 K/uL — ABNORMAL LOW (ref 150–400)
RBC: 5.04 MIL/uL (ref 4.22–5.81)
RDW: 14.5 % (ref 11.5–15.5)
WBC: 7.3 K/uL (ref 4.0–10.5)
nRBC: 0 % (ref 0.0–0.2)

## 2024-12-12 LAB — BASIC METABOLIC PANEL WITH GFR
Anion gap: 10 (ref 5–15)
BUN: 27 mg/dL — ABNORMAL HIGH (ref 8–23)
CO2: 25 mmol/L (ref 22–32)
Calcium: 9.1 mg/dL (ref 8.9–10.3)
Chloride: 107 mmol/L (ref 98–111)
Creatinine, Ser: 1.31 mg/dL — ABNORMAL HIGH (ref 0.61–1.24)
GFR, Estimated: >60 mL/min
Glucose, Bld: 106 mg/dL — ABNORMAL HIGH (ref 70–99)
Potassium: 4.2 mmol/L (ref 3.5–5.1)
Sodium: 142 mmol/L (ref 135–145)

## 2024-12-12 MED ORDER — OXYCODONE-ACETAMINOPHEN 5-325 MG PO TABS: 1.0000 | ORAL_TABLET | Freq: Four times a day (QID) | ORAL | 0 refills | Status: AC | PRN

## 2024-12-12 MED ORDER — HYDROMORPHONE HCL 1 MG/ML IJ SOLN
1.0000 mg | Freq: Once | INTRAMUSCULAR | Status: AC
  Administered 2024-12-12: 1 mg via INTRAVENOUS
  Filled 2024-12-12: qty 1

## 2024-12-12 MED ORDER — HYDROMORPHONE HCL 1 MG/ML IJ SOLN
0.5000 mg | Freq: Once | INTRAMUSCULAR | Status: AC
  Administered 2024-12-12: 0.5 mg via INTRAVENOUS
  Filled 2024-12-12: qty 0.5

## 2024-12-12 MED ORDER — PREDNISONE 20 MG PO TABS: ORAL_TABLET | ORAL | 0 refills | Status: AC

## 2024-12-12 MED ORDER — METHYLPREDNISOLONE SODIUM SUCC 125 MG IJ SOLR
125.0000 mg | Freq: Once | INTRAMUSCULAR | Status: AC
  Administered 2024-12-12: 125 mg via INTRAMUSCULAR
  Filled 2024-12-12: qty 2

## 2024-12-12 MED ORDER — HYDROCODONE-ACETAMINOPHEN 5-325 MG PO TABS
2.0000 | ORAL_TABLET | Freq: Once | ORAL | Status: AC
  Administered 2024-12-12: 2 via ORAL
  Filled 2024-12-12: qty 2

## 2024-12-12 NOTE — Telephone Encounter (Signed)
 Patients wife Olam called stated patient is currently at [facility] post fall last night. States currently admitted at [facility] and has not been released due to inability to walk.  Patient is scheduled for back surgery by neurosurgeon on 12/29/24.  Wife is concerned that patient needs to have a hip surgery instead. Advised we are not on call at [facility] and that decision would be consulted with Orthopedics on call there for anything immediate. Advised Dr. Vernetta is currently on bereavement today and that I would send to practice call provider. Advised decision over back surgery vs hip will also have to be determined by neurosurgeon who is doing back surgery.   Patient understood and was very thankful that I note is being placed in chart and sent to practice call provider so someone is aware.

## 2024-12-12 NOTE — ED Provider Notes (Signed)
 " [facility] Provider Note   CSN: [id number] Arrival date & time: 12/12/24  0445     Patient presents with: Aaron Clarke is a 69 y.o. male.   The history is provided by the patient.  Patient with history of hypertension, chronic pain, VTE on Eliquis  presents after he could not get out of his lift chair Patient reports he was attempting get out of his lift chair and his legs gave out and he slid down on his buttocks.  No head injury.  No LOC. No traumatic injuries reported.  Patient reports continued pain  In his left hip and groin Reports difficulty walking on a daily basis.  He is on pain medicines at home 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   Family history of breast cancer    mother   Hypertension    Osteoarthritis of right hip    PE (pulmonary thromboembolism) (HCC) 10/23/2022    Prior to Admission medications  Medication Sig Start Date End Date Taking? Authorizing Provider  acetaminophen  (TYLENOL ) 500 MG tablet Take 2 tablets (1,000 mg total) by mouth every 8 (eight) hours. Patient taking differently: Take 1,000 mg by mouth every 8 (eight) hours as needed for moderate pain (pain score 4-6). 10/09/24 10/09/25  Ricky Fines, MD  albuterol  (PROVENTIL ) (2.5 MG/3ML) 0.083% nebulizer solution Take 3 mLs (2.5 mg total) by nebulization every 6 (six) hours as needed for wheezing or shortness of breath. 12/27/23   Patsey Lot, MD  albuterol  (VENTOLIN  HFA) 108 (90 Base) MCG/ACT inhaler Inhale 1-2 puffs into the lungs every 6 (six) hours as needed for wheezing or shortness of breath. 08/20/22   Billy Asberry FALCON, PA-C  apixaban  (ELIQUIS ) 5 MG TABS tablet Take 1 tablet (5 mg total) by mouth 2 (two) times daily. 10/09/24   Ricky Fines, MD  bisoprolol  (ZEBETA ) 5 MG tablet TAKE 1 TABLET BY  MOUTH DAILY 09/09/24   Wert, Michael B, MD  budesonide -formoterol  (SYMBICORT ) 160-4.5 MCG/ACT inhaler USE 2 INHALATIONS BY MOUTH FIRST THING IN THE MORNING AND THEN  ANOTHER 2 INHALATIONS AFTER  ABOUT 12 HOURS LATER Patient taking differently: USE 2 INHALATIONS BY MOUTH FIRST THING IN THE MORNING AND THEN  ANOTHER 2 INHALATIONS AFTER  ABOUT 12 HOURS LATER 09/09/24   Darlean Ozell NOVAK, MD  cetirizine (ZYRTEC) 10 MG tablet Take 10 mg by mouth daily.    [provider]  gabapentin  (NEURONTIN ) 300 MG capsule Take 1 capsule (300 mg total) by mouth 4 (four) times daily. 10/28/24   Hilma Hastings, PA-C  lidocaine  (LIDODERM ) 5 % Place 1 patch onto the skin daily as needed (pain).    [provider]  losartan  (COZAAR ) 25 MG tablet Take 1 tablet (25 mg total) by mouth daily. 10/14/24 10/14/25  Kayla Jeoffrey RAMAN, FNP  methocarbamol  (ROBAXIN ) 500 MG tablet Take 1.5 tablets (750 mg total) by mouth every 8 (eight) hours as needed for muscle spasms. 11/27/24   Clois Fret, MD  polyethylene glycol (MIRALAX  / GLYCOLAX ) 17 g packet Take 17 g by mouth daily. Patient taking differently: Take 17 g by mouth daily as needed for mild constipation. 10/09/24   Ricky Fines, MD  rosuvastatin  (CRESTOR ) 10 MG tablet Take 1 tablet (10 mg total) by mouth daily. 08/22/24   Rana Dixon  L, NP  senna-docusate (SENOKOT-S) 8.6-50 MG tablet Take 1 tablet by mouth at bedtime as needed for mild constipation. 10/21/24   Long, Joshua G, MD    Allergies: Patient has no known allergies.    Review of Systems  Gastrointestinal:  Negative for vomiting.  Neurological:  Negative for headaches.    Updated Vital Signs BP (!) 156/91 (BP Location: Left Arm)   Pulse (!) 59   Temp 98.3 F (36.8 C) (Oral)   Resp 13   Ht 1.829 m (6')   Wt 134.3 kg   SpO2 95%   BMI 40.14 kg/m   Physical Exam CONSTITUTIONAL: Chronically ill-appearing, no distress HEAD: Normocephalic/atraumatic ENMT: Mucous membranes moist NECK:  supple no meningeal signs SPINE/BACK:entire spine nontender CV: S1/S2 noted, no murmurs/rubs/gallops noted LUNGS: Lungs are clear to auscultation bilaterally, no apparent distress ABDOMEN: soft, nontender, obese GU - No inguinal hernia, no scrotal or testicular tenderness, erythema, no bruising NEURO: Pt is awake/alert/appropriate, moves all extremitiesx4.  No facial droop.  He can move all 4 extremities, the left leg is limited due to chronic hip pain EXTREMITIES: pulses normal/equal, full ROM All other extremities/joints palpated/ranged and nontender SKIN: warm, color normal  (all labs ordered are listed, but only abnormal results are displayed) Labs Reviewed  URINALYSIS, ROUTINE W REFLEX MICROSCOPIC - Abnormal; Notable for the following components:      Result Value   Hgb urine dipstick SMALL (*)    All other components within normal limits  BASIC METABOLIC PANEL WITH GFR - Abnormal; Notable for the following components:   Glucose, Bld 106 (*)    BUN 27 (*)    Creatinine, Ser 1.31 (*)    All other components within normal limits  CBC WITH DIFFERENTIAL/PLATELET - Abnormal; Notable for the following components:   Platelets 147 (*)    All other components within normal limits    EKG: None  Radiology: No results found.   Procedures   Medications Ordered in the ED  HYDROcodone -acetaminophen  (NORCO/VICODIN) 5-325 MG per tablet 2 tablet (2 tablets Oral Given 12/12/24 0553)    Clinical Course as of 12/12/24 0645  Fri Dec 12, 2024  0611 Patient presents from home after he was unable to get out of his lift chair and he slid into the floor.  Reports he was unable to stand up on his own He has no evidence of any acute traumatic injuries.  No signs of head injury. Will defer any imaging for now. Patient reports chronic back pain and left hip pain, is scheduled for lumbar discectomy next month [DW]  [phone] Patient Rechecked, patient appears improved, sitting up eating and Egg McMuffin  sandwich and drinking soda  [DW]  0644 Creatinine(!): 1.31 Chronic renal insufficiency [DW]  9355 Patient feels he is at his baseline.  There is no signs of any acute traumatic injuries Patient safe for discharge. [DW]    Clinical Course User Index [DW] Midge Golas, MD           Glasgow Coma Scale Score: 15      NEXUS Criteria Score: 0                Medical Decision Making Amount and/or Complexity of Data Reviewed Labs: ordered. Decision-making details documented in ED Course.  Risk Prescription drug management.        Final diagnoses:  Fall, initial encounter  Other chronic pain    ED Discharge Orders     None  Midge Golas, MD 12/12/24 [phone]  "

## 2024-12-12 NOTE — Telephone Encounter (Signed)
 The patient is currently admitted to [facility] following a fall, and reports that their legs have completely stopped functioning. The patient and their fianc are dissatisfied with the level of care being provided at [facility] and are concerned that the patients condition may worsen. FYI. Pt fiance would like a call back.

## 2024-12-12 NOTE — ED Provider Notes (Signed)
 Patient now refusing to walk, reports he cannot put any weight on his left leg Patient did not have any new trauma.  He was able to range the hip Will obtain x-ray, treat pain and reassess. Signed out to Dr. Suzette at shift change   Midge Golas, MD 12/12/24 [phone]

## 2024-12-12 NOTE — Telephone Encounter (Addendum)
 Xray of left hip as well as CT of left hip, CT lumbar spine, and CT abdomen/pelvis were completed in the ER. Will forward to Dr Clois and Dr Vernetta for review.   He was scheduled to see ortho (for his left hip) on 12/15/24, but this has been canceled due to weather. It is not rescheduled at this time.   At the time of this message, he has been discharged from [facility] ER with prescriptions for prednisone  and percocet.  He is scheduled for surgery on 12/29/24. Per review of last office note, this date was chosen due to need for left hip evaluation by Ortho and need to wait 6 weeks after last epidural steroid injection.

## 2024-12-12 NOTE — Discharge Instructions (Signed)
   SEEK IMMEDIATE MEDICAL ATTENTION IF: There is confusion or drowsiness (although children frequently become drowsy after injury).  You cannot awaken the injured person.  There is nausea (feeling sick to your stomach) or continued, forceful vomiting.  You notice dizziness or unsteadiness which is getting worse, or inability to walk.  You have convulsions or unconsciousness.  You experience severe, persistent headaches not relieved by Tylenol. (Do not take aspirin as this impairs clotting abilities). Take other pain medications only as directed.  You cannot use arms or legs normally.  There are changes in pupil sizes. (This is the black center in the colored part of the eye)  There is clear or bloody discharge from the nose or ears.  Change in speech, vision, swallowing, or understanding.  Localized weakness, numbness, tingling, or change in bowel or bladder control.

## 2024-12-12 NOTE — ED Triage Notes (Signed)
 Pt to ED via [facility] from home c/o fall tonight.  Pt states was getting up from recliner to go to the bathroom and slid out of chair onto buttocks.  Denies hitting head. Was unable to get up from the floor.  Prior left hip and back pain.  Pt is on blood thinners.  Pt A&Ox4.

## 2024-12-12 NOTE — Telephone Encounter (Signed)
 Returned patient's significant other's call.   Patient has been admitted to [facility]. She states that patient has not been able to be mobile. She states that it seems like his knee goes out on him. She has done everything she can think of to prevent him falling, including walking behind him to keep his legs straight and upgraded his walker. She said he can't use his leg, stating that the patient said something has happened that feels different. Symptoms shoot down left leg, into his testicles down to his knee. Patient can barely lay in the bed. Lays sideways in the recliner. She confirms that these symptoms have been going on for a long time now.   She has a call out to ortho, where she is trying to get a new appointment set up.   Significant other calling to request advice on how to care for patient in the meantime. Advised she talk to the social worker at the hospital to see if home health services may be available as she is not wanting to send him into a rehab. She voices understanding and expresses thanks.   Plan to move forward with surgery as scheduled.

## 2024-12-12 NOTE — Telephone Encounter (Signed)
 Second CB attempt. No answer, unable to LVM due to full VMB. Routing to callbacks for additional attempts.  Copied from CRM [id number]. Topic: Clinical - Medical Advice >> Dec 12, 2024 10:50 AM Aaron Clarke wrote: Reason for CRM: Admitted to hospital, pt had a fall, no use of legs. He is currently at [facility] and may want him to be transferred to [facility] due to lack of care. They have been requesting for a bed and have not received any update on this. Pt's wife does not want to have pt to go back to nursing facility , but the ones he has gone to are filthy and not well. She would like addtl advise on how to help pt. Please advise spouse Aaron Clarke on #[phone]

## 2024-12-12 NOTE — Telephone Encounter (Signed)
 This RN made first attempt to triage patient. Spouse answered, but could not hear this RN. This RN disconnected and called back. No answer, unable to LVM due to full VMB. Routing for additional attempts.  Copied from CRM [id number]. Topic: Clinical - Medical Advice >> Dec 12, 2024 10:50 AM Hadassah PARAS wrote: Reason for CRM: Admitted to hospital, pt had a fall, no use of legs. He is currently at [facility] and may want him to be transferred to [facility] due to lack of care. They have been requesting for a bed and have not received any update on this. Pt's wife does not want to have pt to go back to nursing facility , but the ones he has gone to are filthy and not well. She would like addtl advise on how to help pt. Please advise spouse Olam Lowe on #[phone]

## 2024-12-12 NOTE — Telephone Encounter (Signed)
 Third CB attempt. No answer, unable to LVM due to full VMB. Routing to callbacks for additional attempts.    Copied from CRM [id number]. Topic: Clinical - Medical Advice >> Dec 12, 2024 10:50 AM Hadassah PARAS wrote: Reason for CRM: Admitted to hospital, pt had a fall, no use of legs. He is currently at [facility] and may want him to be transferred to [facility] due to lack of care. They have been requesting for a bed and have not received any update on this. Pt's wife does not want to have pt to go back to nursing facility , but the ones he has gone to are filthy and not well. She would like addtl advise on how to help pt. Please advise spouse Olam Lowe on #[phone]

## 2024-12-13 DIAGNOSIS — G4733 Obstructive sleep apnea (adult) (pediatric): Secondary | ICD-10-CM | POA: Diagnosis not present

## 2024-12-13 NOTE — Procedures (Signed)
 [facility] [address] [place], [place] [address] Tel: [phone]   Fax: [phone]  Polysomnography Interpretation  Patient Name:  Aaron Clarke, THIER Study Date:  12/02/2024 Referring Physician:  ALMARIE FERRARI [phone]) %%startinterp%% Indications for Polysomnography The patient is a 69 year old Male who is 6' and weighs 292.0 lbs. His BMI equals 40.0.  A full night polysomnogram was performed to evaluate for OSA, negative home sleep test.  Polysomnogram Data A full night polysomnogram recorded the standard physiologic parameters including EEG, EOG, EMG, EKG, nasal and oral airflow.  Respiratory parameters of chest and abdominal movements were recorded with Respiratory Inductance Plethysmography belts.  Oxygen saturation was recorded by pulse oximetry.   Sleep Architecture The total recording time of the polysomnogram was 382.1 minutes.  The total sleep time was 227.0 minutes.  The patient spent 17.0% of total sleep time in Stage N1, 57.7% in Stage N2, 1.5% in Stages N3, and 23.8% in REM.  Sleep latency was 44.8 minutes.  REM latency was 65.0 minutes.  Sleep Efficiency was 59.4%.  Wake after Sleep Onset time was 110.0 minutes.  Respiratory Events The polysomnogram revealed a presence of 1 obstructive, 1 central, and - mixed apneas resulting in an Apnea index of 0.5 events per hour.  There were 58 hypopneas (>=3% desaturation and/or arousal) resulting in an Apnea\Hypopnea Index (AHI >=3% desaturation and/or arousal) of 15.9 events per hour.  There were 15 hypopneas (>=4% desaturation) resulting in an Apnea\Hypopnea Index (AHI >=4% desaturation) of 4.5 events per hour.  There were 36 Respiratory Effort Related Arousals resulting in a RERA index of 9.5 events per hour. The Respiratory Disturbance Index is 25.4 events per hour.  The snore index was - events per hour.  Mean oxygen saturation was 91.6%.  The lowest oxygen saturation during sleep was  83.0%.  Time spent <=88% oxygen saturation was 7.3 minutes (2.1%).  Limb Activity There were 149 total limb movements recorded, of this total, 148 were classified as PLMs.  PLM index was 39.1 per hour and PLM associated with Arousals index was 5.0 per hour.  Cardiac Summary The average pulse rate was 60.1 bpm.  The minimum pulse rate was 53.0 bpm while the maximum pulse rate was 86.0 bpm.  Cardiac rhythm was normal/abnormal.  Comments: Patient study was performed in a recliner per patient's request due to back and hip pain. Patient stated this is how he sleeps at home. Diagnosis: REM related OSA -  4% AHI did not reach significance although 3% AHI was high & clinically significant desaturation was noted.  Recommendations: Please corelate with clinical history Options or treatment for mild obstructive sleep apnea include oral appliance or CPAP therapy. The cardiovascular implication of mild OSA are minimal. Watchful waiting with focus on weight loss and sleep position modification mas also be appropriate is not symptomatic. Positional therapy should be advised   This study was personally reviewed and electronically signed by: Dr. Harden Staff Accredited Board Certified in Sleep Medicine Date/Time: 12/13/2024

## 2024-12-15 ENCOUNTER — Ambulatory Visit

## 2024-12-15 ENCOUNTER — Ambulatory Visit: Admitting: Orthopaedic Surgery

## 2024-12-16 NOTE — Telephone Encounter (Signed)
 Left message for the pt to see if he would like to move his tele appt to today PM preop sched.

## 2024-12-17 ENCOUNTER — Ambulatory Visit

## 2024-12-17 ENCOUNTER — Other Ambulatory Visit: Payer: Self-pay

## 2024-12-17 ENCOUNTER — Encounter: Payer: Self-pay | Admitting: Physician Assistant

## 2024-12-17 ENCOUNTER — Encounter
Admission: RE | Admit: 2024-12-17 | Discharge: 2024-12-17 | Disposition: A | Discharge status: Home / Self Care | Source: Ambulatory Visit | Attending: Neurosurgery | Admitting: Neurosurgery

## 2024-12-17 VITALS — BP 143/84 | HR 79 | Temp 98.1°F | Resp 18 | Ht 72.0 in | Wt 293.0 lb

## 2024-12-17 DIAGNOSIS — M5416 Radiculopathy, lumbar region: Secondary | ICD-10-CM | POA: Insufficient documentation

## 2024-12-17 DIAGNOSIS — Z01812 Encounter for preprocedural laboratory examination: Secondary | ICD-10-CM | POA: Insufficient documentation

## 2024-12-17 DIAGNOSIS — R29898 Other symptoms and signs involving the musculoskeletal system: Secondary | ICD-10-CM | POA: Insufficient documentation

## 2024-12-17 DIAGNOSIS — Z01810 Encounter for preprocedural cardiovascular examination: Secondary | ICD-10-CM

## 2024-12-17 DIAGNOSIS — Z01818 Encounter for other preprocedural examination: Secondary | ICD-10-CM

## 2024-12-17 HISTORY — DX: Atherosclerotic heart disease of native coronary artery without angina pectoris: I25.10

## 2024-12-17 HISTORY — DX: Chronic kidney disease, stage 3a: N18.31

## 2024-12-17 HISTORY — DX: Essential (primary) hypertension: I10

## 2024-12-17 HISTORY — DX: Chronic obstructive pulmonary disease, unspecified: J44.9

## 2024-12-17 HISTORY — DX: Other chronic pain: G89.29

## 2024-12-17 HISTORY — DX: Hyperlipidemia, unspecified: E78.5

## 2024-12-17 LAB — SURGICAL PCR SCREEN
MRSA, PCR: NEGATIVE
Staphylococcus aureus: NEGATIVE

## 2024-12-17 LAB — URINALYSIS, COMPLETE (UACMP) WITH MICROSCOPIC
Bilirubin Urine: NEGATIVE
Glucose, UA: NEGATIVE mg/dL
Hgb urine dipstick: NEGATIVE
Ketones, ur: NEGATIVE mg/dL
Leukocytes,Ua: NEGATIVE
Nitrite: NEGATIVE
Protein, ur: 30 mg/dL — AB
Specific Gravity, Urine: 1.028 (ref 1.005–1.030)
Squamous Epithelial / HPF: 0 /HPF (ref 0–5)
pH: 5 (ref 5.0–8.0)

## 2024-12-17 LAB — TYPE AND SCREEN
ABO/RH(D): A POS
Antibody Screen: NEGATIVE

## 2024-12-17 NOTE — Telephone Encounter (Signed)
 Notes faxed to surgeon.  Glendia Ferrier, PA-C  12/17/2024 5:00 PM

## 2024-12-17 NOTE — Progress Notes (Signed)
"  ° °  Virtual Visit via Telephone Note for Surgical Clearance    Because of Aaron Clarke co-morbid illnesses, he is at least at moderate risk for complications without adequate follow up.  This format is felt to be most appropriate for this patient at this time.  Due to technical limitations with video connection (technology), today's appointment will be conducted as an audio only telehealth visit, and Aaron Clarke verbally agreed to proceed in this manner.   All issues noted in this document were discussed and addressed.  No physical exam could be performed with this format.  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 male who presents via audio/video conferencing for a telehealth visit today.   Pt was last seen in cardiology clinic on 08/22/24 by Aaron Louis, NP.  At that time Aaron Clarke was doing well.    Pending Surgery: Lumbar discectomy Type of anesthesia: General Medications to be held: Eliquis   Since his last visit, he has done well w/o chest pain, shortness of breath, syncope.    Physical Exam  Vital Signs:  Aaron Clarke does not have vital signs available for review today. Given telephonic nature of communication, physical exam is limited. AAOx3. NAD. Normal affect.  Speech and respirations are  unlabored.  Recent Labs    12/12/24 0553  CREATININE 1.31*    Assessment & Plan   Assessment & Plan Preoperative cardiovascular examination [name]'s perioperative risk of a major cardiac event is 0.4% according to the Revised Cardiac Risk Index (RCRI).  Therefore, he is at low risk for perioperative complications.   His functional capacity is good at 4.31 METs according to the Duke Activity Status Index (DASI). Recommendations: According to ACC/AHA guidelines, no further cardiovascular testing needed.  The patient may proceed to surgery at acceptable risk.   Antiplatelet and/or Anticoagulation Recommendations: Eliquis  is not managed by our service. Recommendations for holding Eliquis  around surgery will need to come from prescribing provider.   A copy of this note will be routed to requesting surgeon.  Time:   Today, I have spent 3 minutes with the patient with telehealth technology discussing medical history, symptoms, and management plan.     Glendia Ferrier, PA-C 12/17/2024, 12:40 PM  "

## 2024-12-17 NOTE — Patient Instructions (Addendum)
 Your procedure is scheduled on: Monday 12/29/24 Report to the Registration Desk on the 1st floor of the [facility]. To find out your arrival time, please call [phone] between 1PM - 3PM on: Friday 12/26/24 If your arrival time is 6:00 am, do not arrive before that time as the [facility] entrance doors do not open until 6:00 am.  REMEMBER: Instructions that are not followed completely may result in serious medical risk, up to and including death; or upon the discretion of your surgeon and anesthesiologist your surgery may need to be rescheduled.  Do not eat food after midnight the night before surgery.  No gum chewing or hard candies.  You may however, drink CLEAR liquids up to 2 hours before you are scheduled to arrive for your surgery. Do not drink anything within 2 hours of your scheduled arrival time.  Clear liquids include: - water   - apple juice without pulp - black coffee or tea (Do NOT add milk or creamers to the coffee or tea) Do NOT drink anything that is not on this list.   One week prior to surgery: Stop Anti-inflammatories (NSAIDS) such as Advil, Aleve, Ibuprofen, Motrin, Naproxen, Naprosyn and Aspirin  based products such as Excedrin, Goody's Powder, BC Powder. Stop ANY OVER THE COUNTER supplements until after surgery.  You may however, continue to take Tylenol  if needed for pain up until the day of surgery.   Stop apixaban  (ELIQUIS ) 5 MG TABS 3 days prior to surgery (take last dose Thursday 12/25/24).  Continue taking all of your other prescription medications up until the day of surgery.  ON THE DAY OF SURGERY ONLY TAKE THESE MEDICATIONS WITH SIPS OF WATER :  bisoprolol  (ZEBETA ) 5 MG  gabapentin  (NEURONTIN ) 300 MG  oxyCODONE -acetaminophen  (PERCOCET/ROXICET) 5-325   Use inhalers on the day of surgery and bring to the hospital. albuterol  (VENTOLIN  HFA) 108 (90 Base) MCG/ACT inhaler  budesonide -formoterol  (SYMBICORT ) 160-4.5 MCG/ACT inhaler    No Alcohol for 24  hours before or after surgery.  No Smoking including e-cigarettes for 24 hours before surgery.  No chewable tobacco products for at least 6 hours before surgery.  No nicotine patches on the day of surgery.  Do not use any recreational drugs for at least a week (preferably 2 weeks) before your surgery.  Please be advised that the combination of cocaine and anesthesia may have negative outcomes, up to and including death. If you test positive for cocaine, your surgery will be cancelled.  On the morning of surgery brush your teeth with toothpaste and water , you may rinse your mouth with mouthwash if you wish. Do not swallow any toothpaste or mouthwash.  Use CHG Soap or wipes as directed on instruction sheet.  Do not wear jewelry, make-up, hairpins, clips or nail polish.  For welded (permanent) jewelry: bracelets, anklets, waist bands, etc.  Please have this removed prior to surgery.  If it is not removed, there is a chance that hospital personnel will need to cut it off on the day of surgery.  Do not wear lotions, powders, or perfumes.   Do not shave body hair from the neck down 48 hours before surgery.  Contact lenses, hearing aids and dentures may not be worn into surgery.  Do not bring valuables to the hospital. [facility] is not responsible for any missing/lost belongings or valuables.   Total Shoulder Arthroplasty:  use Benzoyl Peroxide 5% Gel as directed on instruction sheet.  Bring your C-PAP to the hospital in case you may have  to spend the night.   Notify your doctor if there is any change in your medical condition (cold, fever, infection).  Wear comfortable clothing (specific to your surgery type) to the hospital.  After surgery, you can help prevent lung complications by doing breathing exercises.  Take deep breaths and cough every 1-2 hours. Your doctor may order a device called an Incentive Spirometer to help you take deep breaths. When coughing or sneezing, hold a  pillow firmly against your incision with both hands. This is called splinting. Doing this helps protect your incision. It also decreases belly discomfort.  If you are being admitted to the hospital overnight, leave your suitcase in the car. After surgery it may be brought to your room.  In case of increased patient census, it may be necessary for you, the patient, to continue your postoperative care in the Same Day Surgery department.  If you are being discharged the day of surgery, you will not be allowed to drive home. You will need a responsible individual to drive you home and stay with you for 24 hours after surgery.   If you are taking public transportation, you will need to have a responsible individual with you.  Please call the Pre-admissions Testing Dept. at [phone] if you have any questions about these instructions.  Surgery Visitation Policy:  Patients having surgery or a procedure may have two visitors.  Children under the age of 40 must have an adult with them who is not the patient.  Inpatient Visitation:    Visiting hours are 7 a.m. to 8 p.m. Up to four visitors are allowed at one time in a patient room. The visitors may rotate out with other people during the day.  One visitor age 67 or older may stay with the patient overnight and must be in the room by 8 p.m.   Merchandiser, Retail to address health-related social needs:  [email or web address]    Pre-operative 4 CHG Bath Instructions   You can play a key role in reducing the risk of infection after surgery. Your skin needs to be as free of germs as possible. You can reduce the number of germs on your skin by washing with CHG (chlorhexidine  gluconate) soap before surgery. CHG is an antiseptic soap that kills germs and continues to kill germs even after washing.   DO NOT use if you have an allergy to chlorhexidine /CHG or antibacterial soaps. If your skin becomes reddened or irritated, stop  using the CHG and notify one of our RNs at [phone].   Please shower with the CHG soap starting 4 days before surgery using the following schedule:     Please keep in mind the following:  DO NOT shave, including legs and underarms, starting the day of your first shower.   You may shave your face at any point before/day of surgery.  Place clean sheets on your bed the day you start using CHG soap. Use a clean washcloth (not used since being washed) for each shower. DO NOT sleep with pets once you start using the CHG.   CHG Shower Instructions:  If you choose to wash your hair and private area, wash first with your normal shampoo/soap.  After you use shampoo/soap, rinse your hair and body thoroughly to remove shampoo/soap residue.  Turn the water  OFF and apply about 3 tablespoons (45 ml) of CHG soap to a CLEAN washcloth.  Apply CHG soap ONLY FROM YOUR NECK DOWN TO YOUR TOES (washing for 3-5 minutes)  DO NOT use CHG soap on face, private areas, open wounds, or sores.  Pay special attention to the area where your surgery is being performed.  If you are having back surgery, having someone wash your back for you may be helpful. Wait 2 minutes after CHG soap is applied, then you may rinse off the CHG soap.  Pat dry with a clean towel  Put on clean clothes/pajamas   If you choose to wear lotion, please use ONLY the CHG-compatible lotions on the back of this paper.     Additional instructions for the day of surgery: DO NOT APPLY any lotions, deodorants, cologne, or perfumes.   Put on clean/comfortable clothes.  Brush your teeth.  Ask your nurse before applying any prescription medications to the skin.      CHG Compatible Lotions   Aveeno Moisturizing lotion  Cetaphil Moisturizing Cream  Cetaphil Moisturizing Lotion  Clairol Herbal Essence Moisturizing Lotion, Dry Skin  Clairol Herbal Essence Moisturizing Lotion, Extra Dry Skin  Clairol Herbal Essence Moisturizing Lotion, Normal  Skin  Curel Age Defying Therapeutic Moisturizing Lotion with Alpha Hydroxy  Curel Extreme Care Body Lotion  Curel Soothing Hands Moisturizing Hand Lotion  Curel Therapeutic Moisturizing Cream, Fragrance-Free  Curel Therapeutic Moisturizing Lotion, Fragrance-Free  Curel Therapeutic Moisturizing Lotion, Original Formula  Eucerin Daily Replenishing Lotion  Eucerin Dry Skin Therapy Plus Alpha Hydroxy Crme  Eucerin Dry Skin Therapy Plus Alpha Hydroxy Lotion  Eucerin Original Crme  Eucerin Original Lotion  Eucerin Plus Crme Eucerin Plus Lotion  Eucerin TriLipid Replenishing Lotion  Keri Anti-Bacterial Hand Lotion  Keri Deep Conditioning Original Lotion Dry Skin Formula Softly Scented  Keri Deep Conditioning Original Lotion, Fragrance Free Sensitive Skin Formula  Keri Lotion Fast Absorbing Fragrance Free Sensitive Skin Formula  Keri Lotion Fast Absorbing Softly Scented Dry Skin Formula  Keri Original Lotion  Keri Skin Renewal Lotion Keri Silky Smooth Lotion  Keri Silky Smooth Sensitive Skin Lotion  Nivea Body Creamy Conditioning Oil  Nivea Body Extra Enriched Lotion  Nivea Body Original Lotion  Nivea Body Sheer Moisturizing Lotion Nivea Crme  Nivea Skin Firming Lotion  NutraDerm 30 Skin Lotion  NutraDerm Skin Lotion  NutraDerm Therapeutic Skin Cream  NutraDerm Therapeutic Skin Lotion  ProShield Protective Hand Cream  Provon moisturizing lotion  How to Use an Incentive Spirometer  An incentive spirometer is a tool that measures how well you are filling your lungs with each breath. Learning to take long, deep breaths using this tool can help you keep your lungs clear and active. This may help to reverse or lessen your chance of developing breathing (pulmonary) problems, especially infection. You may be asked to use a spirometer: After a surgery. If you have a lung problem or a history of smoking. After a long period of time when you have been unable to move or be active. If the  spirometer includes an indicator to show the highest number that you have reached, your health care provider or respiratory therapist will help you set a goal. Keep a log of your progress as told by your health care provider. What are the risks? Breathing too quickly may cause dizziness or cause you to pass out. Take your time so you do not get dizzy or light-headed. If you are in pain, you may need to take pain medicine before doing incentive spirometry. It is harder to take a deep breath if you are having pain. How to use your incentive spirometer  Sit up on the edge of your  bed or on a chair. Hold the incentive spirometer so that it is in an upright position. Before you use the spirometer, breathe out normally. Place the mouthpiece in your mouth. Make sure your lips are closed tightly around it. Breathe in slowly and as deeply as you can through your mouth, causing the piston or the ball to rise toward the top of the chamber. Hold your breath for 3-5 seconds, or for as long as possible. If the spirometer includes a coach indicator, use this to guide you in breathing. Slow down your breathing if the indicator goes above the marked areas. Remove the mouthpiece from your mouth and breathe out normally. The piston or ball will return to the bottom of the chamber. Rest for a few seconds, then repeat the steps 10 or more times. Take your time and take a few normal breaths between deep breaths so that you do not get dizzy or light-headed. Do this every 1-2 hours when you are awake. If the spirometer includes a goal marker to show the highest number you have reached (best effort), use this as a goal to work toward during each repetition. After each set of 10 deep breaths, cough a few times. This will help to make sure that your lungs are clear. If you have an incision on your chest or abdomen from surgery, place a pillow or a rolled-up towel firmly against the incision when you cough. This can help to  reduce pain while taking deep breaths and coughing. General tips When you are able to get out of bed: Walk around often. Continue to take deep breaths and cough in order to clear your lungs. Keep using the incentive spirometer until your health care provider says it is okay to stop using it. If you have been in the hospital, you may be told to keep using the spirometer at home. Contact a health care provider if: You are having difficulty using the spirometer. You have trouble using the spirometer as often as instructed. Your pain medicine is not giving enough relief for you to use the spirometer as told. You have a fever. Get help right away if: You develop shortness of breath. You develop a cough with bloody mucus from the lungs. You have fluid or blood coming from an incision site after you cough. Summary An incentive spirometer is a tool that can help you learn to take long, deep breaths to keep your lungs clear and active. You may be asked to use a spirometer after a surgery, if you have a lung problem or a history of smoking, or if you have been inactive for a long period of time. Use your incentive spirometer as instructed every 1-2 hours while you are awake. If you have an incision on your chest or abdomen, place a pillow or a rolled-up towel firmly against your incision when you cough. This will help to reduce pain. Get help right away if you have shortness of breath, you cough up bloody mucus, or blood comes from your incision when you cough. This information is not intended to replace advice given to you by your health care provider. Make sure you discuss any questions you have with your health care provider.

## 2024-12-18 ENCOUNTER — Telehealth: Payer: Self-pay | Admitting: Neurosurgery

## 2024-12-18 ENCOUNTER — Ambulatory Visit: Admitting: Physician Assistant

## 2024-12-18 ENCOUNTER — Other Ambulatory Visit (INDEPENDENT_AMBULATORY_CARE_PROVIDER_SITE_OTHER): Payer: Self-pay

## 2024-12-18 DIAGNOSIS — M1612 Unilateral primary osteoarthritis, left hip: Secondary | ICD-10-CM

## 2024-12-18 DIAGNOSIS — M25552 Pain in left hip: Secondary | ICD-10-CM | POA: Diagnosis not present

## 2024-12-18 NOTE — Telephone Encounter (Signed)
 Aaron Clarke was able to obtain an appt with ortho this afternoon

## 2024-12-18 NOTE — Telephone Encounter (Signed)
 Patient's wife is calling to let our office know that the patient saw Dr. Gretta today and was advised that he is not able to have left hip surgery for two months. She states that Dr. Gretta was concerned with the patient being able to have physical therapy after surgery with his hip and the pain he is experiencing. They would like to know what they need to do.

## 2024-12-18 NOTE — Telephone Encounter (Signed)
 I spoke with Aaron Clarke. He reports his left hip is bothering him similar to his right hip when he needed right hip surgery. He is going to call orthopedics to reschedule his appointment with them. He states if he is told he needs hip surgery, he would probably defer to the surgeons opinions regarding of he should have his hip vs back done first. His wife feels he should have his hip done first.

## 2024-12-18 NOTE — Telephone Encounter (Signed)
 I called Aaron Clarke and Aaron Clarke again. I informed them that orthopedics advised that it was their decision whether they have the back or hip done first and that when I discussed this with Dr Clois this morning, he agreed. They decided to stay on schedule for back surgery on 12/29/24. I reminded them that he would have to wait 3 months after back surgery to have his hip surgery. I also informed him that orthopedics would like pain medication to come from his primary care provider if there is a need for pain medication between his back surgery and hip surgery.

## 2024-12-18 NOTE — Progress Notes (Addendum)
 "  Office Visit Note   Patient: Aaron Clarke           Date of Birth: [birth year]           MRN: [id number] Visit Date: 12/18/2024              Requested by: Kayla Jeoffrey RAMAN, FNP [address] [place],  [place] [address] PCP: Kayla Jeoffrey RAMAN, FNP   Assessment & Plan: Visit Diagnoses:  1. Unilateral primary osteoarthritis, left hip   2. Pain in left hip     Plan: Discussed with the patient that he has 2 problems 1 being his back with the lumbar sciatica.  The other is his hip arthritis that has failed conservative treatment.  Discussed with him that it was totally up to him which order of surgeries he would like to proceed with but given the fact that he had a cortisone injection in his hip on 12/03/2024 he would need to wait 2 months to have hip surgery due to the increased risk of infection.  He will speak with Dr. Trudi about this.  However in the interim he would like for us  to fill out the sheet to have surgery in 2 months along with the left hip.  Reviewed the risk and the postoperative protocol with the patient.  He understands the risk include but are not limited to leg length discrepancy, DVT PE, wound healing problems, infection and blood loss.  Will put him on the schedule for late March early April.  If he proceeds with the back surgery first we will just schedule him once he is cleared by neurosurgery to undergo the left total hip arthroplasty.  Follow-Up Instructions: Return for post op.   Orders:  Orders Placed This Encounter  Procedures   XR HIP UNILAT W OR W/O PELVIS 2-3 VIEWS LEFT   No orders of the defined types were placed in this encounter.     Procedures: No procedures performed   Clinical Data: No additional findings.   Subjective: Chief Complaint  Patient presents with   Left Hip - Pain    HPI Aaron Clarke comes in today due to increased left hip pain status post an injury to the left hip.  He has had increased pain since he slid out of his  chair the other day.  Reports he is having groin buttocks pain.  Notes that he is having difficulty bearing weight on the left leg due to the pain.  He is scheduled to undergo back surgery with Dr. Katrina on 67/07/2024.  He is here to discuss whether or not he should have the hip surgery or back surgery first.  States that intra-articular injection left hip on 12/03/2024 gave him very little if any relief and only lasted a day.  He was seen in the ER after his incident of sliding out of the chair and a CT scan of his hip was performed.  This showed the left hip with no acute fracture or acute osseous abnormality.  Left hip arthritic changes moderate in nature with periarticular spurring present.  No effusion of the left hip.  Review of Systems See HPI  Objective: Vital Signs: There were no vitals taken for this visit.  Physical Exam General: No acute distress seated in wheelchair. Respirations unlabored Ortho Exam Left hip overall good range of motion pain minimal with internal rotation.  Fluid range of motion of the hip otherwise.  Straight leg raise on the left is positive.  Specialty Comments:  EXAM: MRI LUMBAR SPINE 10/09/2024 06:03:58 AM   TECHNIQUE: Multiplanar multisequence MRI of the lumbar spine was performed without the administration of intravenous contrast.   COMPARISON: Lumbar radiographs 10/01/2024. CT abdomen and pelvis 09/28/2024. CTA chest 05/25/2024.   CLINICAL HISTORY: 69 year old male with low back pain, persistent right leg pain after a fall, and suspected infection with positive X-ray/CT.   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 Correlation with radiographs is recommended prior to any operative intervention.   L5 superior endplate deformity with faint marrow edema on series 10 image 13 appears to be Schmorl node or  mild compression which has not significantly changed from CT abdomen and pelvis in October. Normal background bone marrow signal. No other marrow edema. Intact visible sacrum and SI joints. Benign L1 vertebral body hemangioma.   SPINAL CORD: Normal conus medullaris at L1-L2.   SOFT TISSUES: Visible abdominal viscera are stable from the recent CT abdomen and pelvis, including asymmetric right renal atrophy. Negative lumbar paraspinal soft tissues.   DEGENERATIVE: Lower thoracic through L4 interbody ankylosis due to hyperostosis.   L4-L5: Bulky circumferential disc bulge with endplate spurring asymmetric to the left and mostly affecting the left neural foramen. Superimposed mild to moderate posterior element hypertrophy. No spinal or convincing lateral recess stenosis. Moderate to severe left L4 neural foraminal stenosis (series 9 image 13).   L5-S1: Disc desiccation and disc space loss with bulky paracentral disc protrusion or extrusion (series 9 image 9 and series 12 image 30). Underlying circumferential disc bulge with endplate spurring, moderate posterior element hypertrophy, and some epidural lipomatosis. Severe spinal and bilateral lateral recess stenosis (descending S1 nerve levels, series 12 image 30). Moderate bilateral L5 neural foraminal stenosis.   IMPRESSION: 1. 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 2. Hyperostosis with sub-total spinal interbody ankylosis; absent in the lumbar spine only at L4L5 and L5S1. 3. L5S1 bulky disc herniation contributes in large part to severe spinal and bilateral lateral recess stenosis. Query S1 radiculitis. Moderate bilateral L5 neural foraminal stenosis. 4. L4L5 bulky disc and endplate degeneration asymmetric to the left with moderate to severe left L4 foraminal stenosis.   Electronically signed by: Helayne Hurst MD  10/09/2024 06:24 AM EST RP Workstation: [id number]  Imaging: XR HIP UNILAT W OR W/O PELVIS 2-3 VIEWS LEFT Result Date: 12/18/2024 AP pelvis lateral view left hip: No acute fractures.  Status post right total hip arthroplasty well-seated components.  Bilateral hips well located.  Moderate left hip arthritis with periarticular spurring.    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   GERD (gastroesophageal reflux disease) 03/12/2024   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   Asthmatic bronchitis , chronic (HCC) 01/02/2024   Physical exam, annual 12/17/2023   Morbid obesity (HCC) 12/17/2023   BMI 38.0-38.9,adult 12/17/2023   Pain in joint of right hip 11/07/2022   Dyspnea on exertion  10/24/2022   Elevated troponin 10/24/2022   Essential hypertension 10/18/2022   History of colonic polyps    Family history of colon cancer in father    Polyp of ascending colon    Cecal polyp    Knee arthropathy 09/04/2022   Screening for colon cancer 08/07/2022   Lumbago of lumbar region with sciatica 08/07/2022   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   Rosacea 05/20/2021   Cholecystitis with cholelithiasis 12/19/2011   Obesity, Class III, BMI 40-49.9 (morbid obesity) (HCC) 12/14/2011   Asthma 12/14/2011   Past Medical History:  Diagnosis Date   Asthma    Cholelithiases    Chronic back pain    Chronic kidney disease    Kidney stone 04/2011   Chronic obstructive pulmonary disease, unspecified COPD type (HCC)    Coronary artery disease involving native coronary artery of native heart  without angina pectoris    DVT (deep venous thrombosis) (HCC) 10/24/2022   BLE DVT   Essential hypertension    Family history of breast cancer    mother   Hyperlipidemia LDL goal <70    Hypertension    Osteoarthritis of right hip    PE (pulmonary thromboembolism) (HCC) 10/23/2022   Stage 3a chronic kidney disease (CKD) (HCC)     Family History  Problem Relation Age of Onset   Cancer Mother        breast   Cancer Father        colon   Heart disease Father    Asthma Sister    Anesthesia problems Neg Hx     Past Surgical History:  Procedure Laterality Date   CHOLECYSTECTOMY  12/19/2011   Procedure: LAPAROSCOPIC CHOLECYSTECTOMY WITH INTRAOPERATIVE CHOLANGIOGRAM;  Surgeon: Elon CHRISTELLA Pacini, MD;  Location: [facility] OR;  Service: General;  Laterality: N/A;   COLONOSCOPY WITH PROPOFOL  N/A 09/13/2022   Procedure: COLONOSCOPY WITH PROPOFOL ;  Surgeon: Unk Corinn Skiff, MD;  Location: [facility] ENDOSCOPY;  Service: Gastroenterology;  Laterality: N/A;   Colonscopy     Fatty tumor  2003   L chest   LEFT HEART CATH AND CORONARY ANGIOGRAPHY N/A 08/01/2024   Procedure: LEFT HEART CATH AND CORONARY ANGIOGRAPHY;  Surgeon: Anner Alm ORN, MD;  Location: [facility] INVASIVE CV LAB;  Service: Cardiovascular;  Laterality: N/A;   TOTAL HIP ARTHROPLASTY Right 02/19/2024   Procedure: ARTHROPLASTY, HIP, TOTAL, ANTERIOR APPROACH;  Surgeon: Vernetta Lonni GRADE, MD;  Location: [facility] OR;  Service: Orthopedics;  Laterality: Right;   Social History   Occupational History   Not on file  Tobacco Use   Smoking status: Former    Current packs/day: 0.00    Average packs/day: 2.0 packs/day for 10.0 years (20.0 ttl pk-yrs)    Types: Cigarettes    Start date: 06/19/1974    Quit date: 06/19/1988    Years since quitting: 36.5   Smokeless tobacco: Never  Vaping Use   Vaping status: Never Used  Substance and Sexual Activity   Alcohol use: Not Currently    Comment: Quit drinking in 2023   Drug use: Never   Sexual activity:  Not Currently    Birth control/protection: None        "

## 2024-12-18 NOTE — Telephone Encounter (Signed)
 I spoke with Aaron Clarke and Aaron Clarke. Ortho said he has to wait 2 months to have surgery bc he had a cortisone injection. They are concerned that he may not be able to do PT after back surgery because of how bad his hip is. They are unsure if they should do hip surgery or back surgery first. I explained that if he has back surgery, he would have to wait 3 months to have hip surgery. They are also concerned about who would manage his pain until surgery.    I then spoke with Aaron Gaskins, PA-C. He states Aaron Clarke likely has 2 problems (hip and back) His xray shows moderate hip arthritis, but doesn't look terrible. When he saw him today, he was having problems ambulating. When he moved his hip, he had discomfort with extremes, but the straight leg about sent him off the chair. Aaron conferred with Dr Vernetta and they agree that whatever the patient wants to do first is fine. In regards to managing his pain until surgery, Dr Vernetta and Aaron said it would have to come from his PCP.

## 2024-12-21 ENCOUNTER — Other Ambulatory Visit: Payer: Self-pay | Admitting: Primary Care

## 2024-12-21 DIAGNOSIS — I10 Essential (primary) hypertension: Secondary | ICD-10-CM

## 2024-12-22 ENCOUNTER — Ambulatory Visit: Admitting: Physician Assistant

## 2024-12-23 ENCOUNTER — Telehealth: Payer: Self-pay | Admitting: Orthopaedic Surgery

## 2024-12-23 ENCOUNTER — Encounter: Payer: Self-pay | Admitting: Neurosurgery

## 2024-12-23 NOTE — Telephone Encounter (Signed)
 Patient's wife called. Would like a RX for a wheelchair. Her cb# [phone]

## 2024-12-23 NOTE — Telephone Encounter (Signed)
 Called patients wife Olam, stated she asked for a lift chair Rx instead of a wheelchair.  I spoke with Tory about Rx and the lift chair Rx is ready for pickup at the front desk.

## 2024-12-24 ENCOUNTER — Other Ambulatory Visit: Payer: Self-pay

## 2024-12-24 NOTE — Telephone Encounter (Signed)
 Medication request sent to provider for review and advisement.

## 2024-12-25 ENCOUNTER — Ambulatory Visit: Admitting: Internal Medicine

## 2024-12-25 NOTE — Progress Notes (Unsigned)
 "   Aaron Clarke, male    DOB: June 04, 1956    MRN: [id number]   Brief patient profile:  16  yowm  quit smoking 1989 told had asthma as child  referred to pulmonary clinic in [place]  01/02/2024 by EDP for eval of doe onset sev  years before covid previously treated with as needed saba but much worse since Jan 2025 / needs clearance for hip surgery by Dr Vernetta Salmons into new house/ new construction [place]24  [place]   Seen in ER 1st time one month p moved into new house   Has h/o PE   [place] > stopped p 4 month rx   History of Present Illness  01/02/2024  Pulmonary/ 1st office eval/ Afreen Siebels / [place] [facility]  Chief Complaint  Patient presents with   Consult   Shortness of Breath   Dyspnea:  walking thru [employer] no problem slow pace /also limited by R hip pain  Cough: white mucus, assoc with  nasal congestion  Sleep: flat bed 2 pillows does ok hs but hears wheezing when supine x years  SABA use: 2 x daily hfa/ neb once a day 02: none  LDSCT:n/a Rec Plan A = Automatic = Always=    Symbicort  160 Take 2 puffs first thing in am and then another 2 puffs about 12 hours later.  Work on inhaler technique:   Plan B = Backup (to supplement plan A, not to replace it) Only use your albuterol  inhaler as a rescue medication Plan C = Crisis (instead of Plan B but only if Plan B stops working) - only use your albuterol  nebulizer if you first try Plan B Stop valsartan  for now  Start bisoprolol  5 mg daily in its place  due to pulse 118 at rest  Prednisone  10 mg take  4 each am x 2 days,   2 each am x 2 days,  1 each am x 2 days and stop  Please schedule a follow up office visit in 2 weeks, sooner if needed  with all medications /inhalers/ solutions in hand    -  Allergy screen 01/02/2024 >  Eos 0.3   IgE  183 -  Alpha one AT phenotype 01/02/2024 >>>  MM  173   01/16/2024  f/u ov/[place] office/[facility] Asmar re: AB maint on symbicort   did not bring meds  Chief Complaint  Patient  presents with   Follow-up    2 week  follow up    Dyspnea:  slowed by hip not sob Cough: no problem  Sleeping: no longer wheeze/ lying flat ok s resp cc  SABA use: none  02: none  Lung cancer screening: see CTa 12/27/23 no nodules  Reduce bisoprolol  to one half tablet each am  Restart valsartan - hct one pill daily - call me if blood pressure not satisfactory  Work on inhaler technique:  You are cleared for R hip surgery  Please schedule a follow up office visit in 6 weeks, call sooner if needed with all medications /inhalers/ solutions in hand    02/27/2024  f/u ov/ office/[employer] re: AB  maint on symbicort  160  did not bring resp rx  Chief Complaint  Patient presents with   Asthma  Dyspnea:  up and down steps/ 2 wheeled walker  Cough: none  Sleeping: loud snoring / daytime hypersomnolence with periods of apnea per fiancee  SABA use: none  02: none Rec Continue symbicort  160 Take 2 puffs first  thing in am and then another 2 puffs about 12 hours later.  Only use your albuterol  as a rescue medication  My office will be contacting you by phone for referral to home sleep study  - if you don't hear back from my office within one week please call us  back or notify us  thru MyChart and we'll address it right away.      06/03/2024  f/u ov/[place] office/Toniyah Dilmore re: AB maint on symbicort  160 2bid   Chief Complaint  Patient presents with   Follow-up    Breathing has improved since the last visit. No new co's.   Dyspnea:  up and down steps better Cough: none Sleeping: bed is flat/ no cough  or other   resp cc  SABA use: none needed  02: none  Positional R parasternal cp > ER 05/25/24/ neg findings/ resolved  Rec Resume the valsartan  160-12.5 mg one daily and discuss options for future doses with Jeoffrey Barrio, your PCP  Pulmonary follow up is as needed    09/24/24  Hope NP rec  PSG     12/25/2024  f/u ov/[place] office/Clydia Nieves re: AB maint on ***  No chief complaint on file.    Dyspnea:  *** Cough: *** Sleeping: ***   resp cc  SABA use: *** 02: ***  Lung cancer screening: ***   No obvious day to day or daytime variability or assoc excess/ purulent sputum or mucus plugs or hemoptysis or cp or chest tightness, subjective wheeze or overt sinus or hb symptoms.    Also denies any obvious fluctuation of symptoms with weather or environmental changes or other aggravating or alleviating factors except as outlined above   No unusual exposure hx or h/o childhood pna/ asthma or knowledge of premature birth.  Current Allergies, Complete Past Medical History, Past Surgical History, Family History, and Social History were reviewed in [employer] record.  ROS  The following are not active complaints unless bolded Hoarseness, sore throat, dysphagia, dental problems, itching, sneezing,  nasal congestion or discharge of excess mucus or purulent secretions, ear ache,   fever, chills, sweats, unintended wt loss or wt gain, classically pleuritic or exertional cp,  orthopnea pnd or arm/hand swelling  or leg swelling, presyncope, palpitations, abdominal pain, anorexia, nausea, vomiting, diarrhea  or change in bowel habits or change in bladder habits, change in stools or change in urine, dysuria, hematuria,  rash, arthralgias, visual complaints, headache, numbness, weakness or ataxia or problems with walking or coordination,  change in mood or  memory.         Outpatient Medications Prior to Visit  Medication Sig Dispense Refill   acetaminophen  (TYLENOL ) 500 MG tablet Take 2 tablets (1,000 mg total) by mouth every 8 (eight) hours. (Patient taking differently: Take 1,000 mg by mouth every 8 (eight) hours as needed for moderate pain (pain score 4-6).)     albuterol  (PROVENTIL ) (2.5 MG/3ML) 0.083% nebulizer solution Take 3 mLs (2.5 mg total) by nebulization every 6 (six) hours as needed for wheezing or shortness of breath. 75 mL 0   albuterol  (VENTOLIN  HFA) 108 (90  Base) MCG/ACT inhaler Inhale 1-2 puffs into the lungs every 6 (six) hours as needed for wheezing or shortness of breath. 8 g 0   apixaban  (ELIQUIS ) 5 MG TABS tablet Take 1 tablet (5 mg total) by mouth 2 (two) times daily. 60 tablet 1   bisoprolol  (ZEBETA ) 5 MG tablet TAKE 1 TABLET BY MOUTH DAILY 100 tablet 2  budesonide -formoterol  (SYMBICORT ) 160-4.5 MCG/ACT inhaler USE 2 INHALATIONS BY MOUTH FIRST THING IN THE MORNING AND THEN  ANOTHER 2 INHALATIONS AFTER  ABOUT 12 HOURS LATER (Patient taking differently: USE 2 INHALATIONS BY MOUTH FIRST THING IN THE MORNING AND THEN  ANOTHER 2 INHALATIONS AFTER  ABOUT 12 HOURS LATER) 30.6 g 3   cetirizine (ZYRTEC) 10 MG tablet Take 10 mg by mouth daily.     gabapentin  (NEURONTIN ) 300 MG capsule Take 1 capsule (300 mg total) by mouth 4 (four) times daily. 120 capsule 0   lidocaine  (LIDODERM ) 5 % Place 1 patch onto the skin daily as needed (pain).     losartan  (COZAAR ) 25 MG tablet Take 1 tablet (25 mg total) by mouth daily. 30 tablet 3   methocarbamol  (ROBAXIN ) 500 MG tablet Take 1.5 tablets (750 mg total) by mouth every 8 (eight) hours as needed for muscle spasms. 90 tablet 0   oxyCODONE -acetaminophen  (PERCOCET/ROXICET) 5-325 MG tablet Take 1 tablet by mouth every 6 (six) hours as needed for severe pain (pain score 7-10). 20 tablet 0   polyethylene glycol (MIRALAX  / GLYCOLAX ) 17 g packet Take 17 g by mouth daily. (Patient taking differently: Take 17 g by mouth daily as needed for mild constipation.) 28 each 1   predniSONE  (DELTASONE ) 20 MG tablet 2 tabs po daily x 3 days 6 tablet 0   rosuvastatin  (CRESTOR ) 10 MG tablet Take 1 tablet (10 mg total) by mouth daily. 90 tablet 2   senna-docusate (SENOKOT-S) 8.6-50 MG tablet Take 1 tablet by mouth at bedtime as needed for mild constipation. 20 tablet 0   valsartan -hydrochlorothiazide  (DIOVAN -HCT) 160-12.5 MG tablet TAKE 1 TABLET BY MOUTH EVERY DAY 90 tablet 3   No facility-administered medications prior to visit.           Past Medical History:  Diagnosis Date   Asthma    Cholelithiases    Chronic kidney disease    Kidney stone 04/2011   Family history of breast cancer    mother      Objective:    Wts  12/25/2024             ***  06/03/2024            314  02/27/2024              295   01/16/24 (!) 300 lb 12.8 oz (136.4 kg)  01/02/24 291 lb (132 kg)  12/31/23 295 lb (133.8 kg)    Vital signs reviewed  12/25/2024  - Note at rest 02 sats  ***% on ***   General appearance:    ***   Min barr***             Assessment        "

## 2024-12-29 ENCOUNTER — Ambulatory Visit: Admission: RE | Admit: 2024-12-29 | Source: Home / Self Care | Admitting: Neurosurgery

## 2024-12-29 ENCOUNTER — Encounter: Payer: Self-pay | Admitting: Urgent Care

## 2024-12-29 ENCOUNTER — Encounter: Admission: RE | Payer: Self-pay | Source: Home / Self Care

## 2024-12-29 HISTORY — DX: Atherosclerosis of aorta: I70.0

## 2024-12-29 HISTORY — DX: Calculus of kidney: N20.0

## 2024-12-29 HISTORY — DX: Long term (current) use of anticoagulants: Z79.01

## 2024-12-29 HISTORY — DX: Other forms of dyspnea: R06.09

## 2024-12-29 HISTORY — DX: Radiculopathy, lumbar region: M54.16

## 2024-12-29 HISTORY — DX: Chronic kidney disease, stage 3 unspecified: N18.30

## 2024-12-31 ENCOUNTER — Ambulatory Visit: Admitting: Orthopaedic Surgery

## 2025-01-07 ENCOUNTER — Ambulatory Visit: Admitting: Urology

## 2025-01-09 ENCOUNTER — Ambulatory Visit: Admitting: Internal Medicine

## 2025-01-12 ENCOUNTER — Encounter: Admitting: Orthopedic Surgery

## 2025-01-22 ENCOUNTER — Other Ambulatory Visit

## 2025-01-28 ENCOUNTER — Ambulatory Visit: Admitting: Family Medicine

## 2025-02-02 ENCOUNTER — Ambulatory Visit: Admitting: Family Medicine

## 2025-02-10 ENCOUNTER — Encounter: Admitting: Neurosurgery

## 2025-03-17 ENCOUNTER — Encounter: Admitting: Orthopedic Surgery
